# Patient Record
Sex: Female | Born: 1935 | Race: White | Hispanic: No | Smoking: Former smoker
Health system: Southern US, Community
[De-identification: ages and names within clinical notes are randomized; demographics above are authoritative.]

## PROBLEM LIST (undated history)

## (undated) DIAGNOSIS — N209 Urinary calculus, unspecified: Secondary | ICD-10-CM

## (undated) DIAGNOSIS — N189 Chronic kidney disease, unspecified: Secondary | ICD-10-CM

## (undated) DIAGNOSIS — I8390 Asymptomatic varicose veins of unspecified lower extremity: Secondary | ICD-10-CM

## (undated) DIAGNOSIS — Z87442 Personal history of urinary calculi: Secondary | ICD-10-CM

## (undated) DIAGNOSIS — M1990 Unspecified osteoarthritis, unspecified site: Secondary | ICD-10-CM

## (undated) DIAGNOSIS — J189 Pneumonia, unspecified organism: Secondary | ICD-10-CM

## (undated) DIAGNOSIS — H3530 Unspecified macular degeneration: Secondary | ICD-10-CM

## (undated) DIAGNOSIS — Z8719 Personal history of other diseases of the digestive system: Secondary | ICD-10-CM

## (undated) DIAGNOSIS — F419 Anxiety disorder, unspecified: Secondary | ICD-10-CM

## (undated) HISTORY — DX: Asymptomatic varicose veins of unspecified lower extremity: I83.90

## (undated) HISTORY — DX: Unspecified osteoarthritis, unspecified site: M19.90

## (undated) HISTORY — PX: TONSILLECTOMY: SUR1361

## (undated) HISTORY — PX: BUNIONECTOMY: SHX129

## (undated) HISTORY — PX: ORIF WRIST FRACTURE: SHX2133

## (undated) HISTORY — PX: ABDOMINAL HYSTERECTOMY: SHX81

## (undated) HISTORY — PX: JOINT REPLACEMENT: SHX530

---

## 1997-02-11 ENCOUNTER — Ambulatory Visit (HOSPITAL_COMMUNITY): Admission: RE | Admit: 1997-02-11 | Discharge: 1997-02-11 | Payer: Self-pay | Admitting: Obstetrics and Gynecology

## 1998-09-30 ENCOUNTER — Other Ambulatory Visit: Admission: RE | Admit: 1998-09-30 | Discharge: 1998-09-30 | Payer: Self-pay | Admitting: Obstetrics and Gynecology

## 1998-11-26 ENCOUNTER — Ambulatory Visit (HOSPITAL_COMMUNITY): Admission: RE | Admit: 1998-11-26 | Discharge: 1998-11-26 | Payer: Self-pay | Admitting: Obstetrics and Gynecology

## 1998-11-26 ENCOUNTER — Encounter: Payer: Self-pay | Admitting: Obstetrics and Gynecology

## 1999-06-04 ENCOUNTER — Encounter: Payer: Self-pay | Admitting: Internal Medicine

## 1999-06-04 ENCOUNTER — Encounter: Admission: RE | Admit: 1999-06-04 | Discharge: 1999-06-04 | Payer: Self-pay | Admitting: Internal Medicine

## 2000-02-02 ENCOUNTER — Other Ambulatory Visit: Admission: RE | Admit: 2000-02-02 | Discharge: 2000-02-02 | Payer: Self-pay | Admitting: Obstetrics and Gynecology

## 2000-05-09 ENCOUNTER — Ambulatory Visit (HOSPITAL_COMMUNITY): Admission: RE | Admit: 2000-05-09 | Discharge: 2000-05-09 | Payer: Self-pay | Admitting: Orthopedic Surgery

## 2000-05-09 ENCOUNTER — Encounter: Payer: Self-pay | Admitting: Orthopedic Surgery

## 2000-08-17 ENCOUNTER — Ambulatory Visit (HOSPITAL_COMMUNITY): Admission: RE | Admit: 2000-08-17 | Discharge: 2000-08-17 | Payer: Self-pay | Admitting: Gastroenterology

## 2000-09-25 ENCOUNTER — Emergency Department (HOSPITAL_COMMUNITY): Admission: EM | Admit: 2000-09-25 | Discharge: 2000-09-26 | Payer: Self-pay | Admitting: Emergency Medicine

## 2000-09-28 ENCOUNTER — Encounter: Payer: Self-pay | Admitting: Gastroenterology

## 2000-09-28 ENCOUNTER — Encounter: Admission: RE | Admit: 2000-09-28 | Discharge: 2000-09-28 | Payer: Self-pay | Admitting: Gastroenterology

## 2000-10-12 ENCOUNTER — Encounter: Payer: Self-pay | Admitting: Internal Medicine

## 2000-10-12 ENCOUNTER — Encounter: Admission: RE | Admit: 2000-10-12 | Discharge: 2000-10-12 | Payer: Self-pay | Admitting: Internal Medicine

## 2000-10-24 ENCOUNTER — Encounter: Payer: Self-pay | Admitting: Internal Medicine

## 2000-10-24 ENCOUNTER — Encounter: Admission: RE | Admit: 2000-10-24 | Discharge: 2000-10-24 | Payer: Self-pay | Admitting: Internal Medicine

## 2000-11-02 ENCOUNTER — Ambulatory Visit (HOSPITAL_COMMUNITY): Admission: RE | Admit: 2000-11-02 | Discharge: 2000-11-02 | Payer: Self-pay | Admitting: Internal Medicine

## 2000-11-02 ENCOUNTER — Encounter: Payer: Self-pay | Admitting: Internal Medicine

## 2001-01-24 ENCOUNTER — Encounter: Payer: Self-pay | Admitting: Internal Medicine

## 2001-01-24 ENCOUNTER — Encounter: Admission: RE | Admit: 2001-01-24 | Discharge: 2001-01-24 | Payer: Self-pay | Admitting: Internal Medicine

## 2002-04-27 ENCOUNTER — Encounter: Admission: RE | Admit: 2002-04-27 | Discharge: 2002-04-27 | Payer: Self-pay | Admitting: Internal Medicine

## 2002-04-27 ENCOUNTER — Encounter: Payer: Self-pay | Admitting: Internal Medicine

## 2002-05-09 ENCOUNTER — Inpatient Hospital Stay (HOSPITAL_COMMUNITY): Admission: RE | Admit: 2002-05-09 | Discharge: 2002-05-14 | Payer: Self-pay | Admitting: Orthopedic Surgery

## 2002-05-10 ENCOUNTER — Encounter: Payer: Self-pay | Admitting: Orthopedic Surgery

## 2002-05-14 ENCOUNTER — Inpatient Hospital Stay (HOSPITAL_COMMUNITY)
Admission: RE | Admit: 2002-05-14 | Discharge: 2002-05-19 | Payer: Self-pay | Admitting: Physical Medicine & Rehabilitation

## 2002-06-25 ENCOUNTER — Encounter: Admission: RE | Admit: 2002-06-25 | Discharge: 2002-06-25 | Payer: Self-pay | Admitting: Internal Medicine

## 2002-06-25 ENCOUNTER — Encounter: Payer: Self-pay | Admitting: Internal Medicine

## 2002-07-02 ENCOUNTER — Ambulatory Visit (HOSPITAL_BASED_OUTPATIENT_CLINIC_OR_DEPARTMENT_OTHER): Admission: RE | Admit: 2002-07-02 | Discharge: 2002-07-02 | Payer: Self-pay | Admitting: Urology

## 2002-07-05 ENCOUNTER — Ambulatory Visit (HOSPITAL_BASED_OUTPATIENT_CLINIC_OR_DEPARTMENT_OTHER): Admission: RE | Admit: 2002-07-05 | Discharge: 2002-07-05 | Payer: Self-pay | Admitting: Urology

## 2002-07-05 ENCOUNTER — Encounter: Payer: Self-pay | Admitting: Urology

## 2002-07-26 ENCOUNTER — Ambulatory Visit (HOSPITAL_BASED_OUTPATIENT_CLINIC_OR_DEPARTMENT_OTHER): Admission: RE | Admit: 2002-07-26 | Discharge: 2002-07-26 | Payer: Self-pay | Admitting: Urology

## 2002-11-05 ENCOUNTER — Ambulatory Visit (HOSPITAL_COMMUNITY): Admission: RE | Admit: 2002-11-05 | Discharge: 2002-11-05 | Payer: Self-pay | Admitting: Urology

## 2002-11-05 ENCOUNTER — Ambulatory Visit (HOSPITAL_BASED_OUTPATIENT_CLINIC_OR_DEPARTMENT_OTHER): Admission: RE | Admit: 2002-11-05 | Discharge: 2002-11-05 | Payer: Self-pay | Admitting: Urology

## 2003-01-28 ENCOUNTER — Encounter: Admission: RE | Admit: 2003-01-28 | Discharge: 2003-01-28 | Payer: Self-pay | Admitting: Internal Medicine

## 2003-02-20 ENCOUNTER — Inpatient Hospital Stay (HOSPITAL_COMMUNITY): Admission: RE | Admit: 2003-02-20 | Discharge: 2003-02-23 | Payer: Self-pay | Admitting: Orthopedic Surgery

## 2003-02-23 ENCOUNTER — Inpatient Hospital Stay (HOSPITAL_COMMUNITY)
Admission: RE | Admit: 2003-02-23 | Discharge: 2003-03-05 | Payer: Self-pay | Admitting: Physical Medicine & Rehabilitation

## 2003-11-27 ENCOUNTER — Encounter: Admission: RE | Admit: 2003-11-27 | Discharge: 2003-11-27 | Payer: Self-pay | Admitting: Orthopedic Surgery

## 2003-11-29 ENCOUNTER — Ambulatory Visit (HOSPITAL_COMMUNITY): Admission: RE | Admit: 2003-11-29 | Discharge: 2003-11-29 | Payer: Self-pay | Admitting: Orthopedic Surgery

## 2003-11-29 ENCOUNTER — Ambulatory Visit (HOSPITAL_BASED_OUTPATIENT_CLINIC_OR_DEPARTMENT_OTHER): Admission: RE | Admit: 2003-11-29 | Discharge: 2003-11-29 | Payer: Self-pay | Admitting: Orthopedic Surgery

## 2005-06-01 ENCOUNTER — Encounter: Admission: RE | Admit: 2005-06-01 | Discharge: 2005-06-01 | Payer: Self-pay | Admitting: Gastroenterology

## 2005-07-08 ENCOUNTER — Encounter: Admission: RE | Admit: 2005-07-08 | Discharge: 2005-07-08 | Payer: Self-pay | Admitting: Gastroenterology

## 2005-08-11 ENCOUNTER — Encounter: Admission: RE | Admit: 2005-08-11 | Discharge: 2005-08-11 | Payer: Self-pay | Admitting: Gastroenterology

## 2007-07-23 ENCOUNTER — Encounter: Admission: RE | Admit: 2007-07-23 | Discharge: 2007-07-23 | Payer: Self-pay | Admitting: Internal Medicine

## 2009-09-18 ENCOUNTER — Encounter: Admission: RE | Admit: 2009-09-18 | Discharge: 2009-09-18 | Payer: Self-pay | Admitting: Internal Medicine

## 2010-05-29 NOTE — Discharge Summary (Signed)
NAME:  Caroline Griffith, Caroline Griffith                          ACCOUNT NO.:  [id number]   MEDICAL RECORD NO.:  [id number]                   PATIENT TYPE:  IPS   LOCATION:  4151                                 FACILITY:  [facility]   PHYSICIAN:  Ellwood Dense, M.D.                DATE OF BIRTH:  October 26, 1930   DATE OF ADMISSION:  05/14/2002  DATE OF DISCHARGE:  05/19/2002                                 DISCHARGE SUMMARY   DISCHARGE DIAGNOSES:  1. Right total knee replacement.  2. Postoperative hematoma.  3. Urinary tract infection.   HISTORY OF PRESENT ILLNESS:  The patient is a 77 year old female with  history of GI bleed, diverticulosis, right knee pain x years, excessive  medial compartment and end-stage changes.  The patient elected to undergo  right total knee replacement May 09, 2002, by Dr. Madelon Lips.  Postoperatively, she is weightbearing as tolerated and on Coumadin for DVT  prophylaxis.  She did receive two units of packed red blood cells past  surgery secondary to drop in H&H.  PT and OT is ongoing.  The patient is  currently minimal assist for transfers, supervision for ambulating 50 feet  with rolling walker, moderate to total assist for ADLs.   PAST MEDICAL HISTORY:  See discharge diagnoses, plus history of pulmonary  nodules, cystitis secondary to UTI, insomnia, hysterectomy, tonsillectomy,  urgency.   ALLERGIES:  SULFA.   SOCIAL HISTORY:  The patient is married and lives with her husband in a one  level home with four steps at entry.  She was independent prior to  admission.   HABITS:  She quit tobacco 40 years ago.  She has two glasses of wine weekly.   HOSPITAL COURSE:  The patient was admitted to rehab on May 14, 2002, for  inpatient therapies to consist of PT and OT daily.  Past admission, she was  maintained on Coumadin with cross coverage with heparin until  Coumadin was  therapeutic.  She was noted to have diffuse ecchymosis from her right knee,  onto her shin and down to  her ankle.  She has had edema of 1+, right lower  extremity, that is stable.  She does continue with small amount of  serosanguineous drainage from the distal aspect of her incision.  She was  noted to have some erythema distally at lateral aspect and was started on  Cipro for wound prophylaxis as well as gram negative rods in her urine.   Labs done past admission showed hemoglobin of 11.9, hematocrit 35.2, white  count 5.9, ______ 30.  Sodium 138, potassium 3.8, chloride 102, CO2 29, BUN  7, creatinine 0.8, glucose 103, alkaline phosphatase 230.   The patient was maintained on Vioxx during her stay.  This was discontinued  at the time of discharge secondary to right lower extremity edema and as to  whether this could be contributing to continued drainage from her incision.  During her stay in rehab, the patient progressed to being independent for  transfers, modified independent for ambulating 150 feet with a rolling  walker, modified independent for navigating four stairs.  She is modified  independent for ADLs including toileting as well as for advanced ADLs.  The  patient is to continue with home health PT and OT past discharge.  The  patient's range of motion of her knee is currently at 92 active, 98 degrees  assisted active.  The patient is to continue on Coumadin 5 mg a day with the  next pro time to checked on May 21, 2002.   DISPOSITION:  On May 19, 2002, the patient is to be discharged to home.   DISCHARGE MEDICATIONS:  1. Coumadin 5 mg p.o. per day.  2. Robaxin 500 mg p.o. q.i.d.  3. Cipro 250 mg b.i.d.  4. Oxycodone IR 5 or 10 mg q.4-6h. p.r.n. pain.  5. Senokot S two q.h.s.  6. Os-Cal plus D one p.o. t.i.d.  7. For pain regiment, the patient can use oxycodone and/or Tylenol.   ACTIVITY:  The patient is to use her walker.  Keep right lower extremity  elevated and wear support stockings.   DIET:  Regular.   WOUND CARE:  Keep area clean and dry.  Keep dry dressing on  the incision.   DISCHARGE INSTRUCTIONS:  No alcohol, no smoking and no driving.   FOLLOW UP:  The patient is to follow up with Dr. Madelon Lips five to seven days  for staple removal and for wound care.  The patient will follow up with Dr.  Lamar Benes as needed.     Greg Cutter, P.A.                    Ellwood Dense, M.D.    PP/MEDQ  D:  05/18/2002  T:  05/21/2002  Job:  [id number]   cc:   Thora Lance, M.D.  [address] [place]  [place] [address]  Fax: [phone]   Thera Flake., M.D.  [address] [place]  [place] [address]  Fax: [phone]

## 2010-05-29 NOTE — Op Note (Signed)
NAME:  Caroline Griffith, Caroline Griffith                          ACCOUNT NO.:  [id number]   MEDICAL RECORD NO.:  [id number]                   PATIENT TYPE:  AMB   LOCATION:  [facility]                                 FACILITY:  [facility]   PHYSICIAN:  Bertram Millard. Dahlstedt, M.D.          DATE OF BIRTH:  Mar 28, 1930   DATE OF PROCEDURE:  11/05/2002  DATE OF DISCHARGE:                                 OPERATIVE REPORT   PREOPERATIVE DIAGNOSIS:  Incomplete passage of left renal/ureteral calculi,  status post lithotripsy.   POSTOPERATIVE DIAGNOSIS:  Incomplete passage of left renal/ureteral calculi,  status post lithotripsy.   PRINCIPAL PROCEDURE:  Cystoscopy, left retrograde ureteropyelogram, left  ureteroscopy, basket extraction of two left ureteral calculi, double J stent  placement (24 cm x 6 [place]), interpretive fluoroscopy.   SURGEON:  Bertram Millard. Dahlstedt, M.D.   ANESTHESIA:  General with LMA.   COMPLICATIONS:  None.   BRIEF HISTORY:  This nice 77 year old lady is status post two separate  lithotripsies for a large left renal calculus.  She has had incomplete  passage of these stones, followed over a couple of month period.  She is not  having significant pain.  Despite the infrequent episodes of pain, it was  recommended that she undergo left ureteroscopy with extraction of her  stone(s).  She is aware of risks and complications of this procedure and  desires to proceed.   DESCRIPTION OF PROCEDURE:  The patient was administered a general anesthetic  after IV antibiotics were administered in the holding area.  She was placed  in the dorsal lithotomy position.  Genitalia and perineum were prepped and  draped.  A 22 French panendoscope was advanced into the bladder, which was  inspected and found to be normal.  The left ureteral orifice was cannulated  with a 6 French end-hole catheter and a retrograde revealed a filling defect  in the left distal ureter, approximately 5-6 cm up the ureter.  There was  no  hydronephrosis.  No filling defects were seen along the rest of the ureter  or in the pyelocalyceal system on the left.  A ureteroscope was then passed  up and a large stone encountered with what looked like a small stone behind  it.  This was extracted with a nitinol basket.  Another pass of the  ureteroscope allowed [place] to engage the second stone, which was then  extracted.  The scope was then advanced up into the kidney.  No stones were  seen up in the proximal ureter or the pelvis.  The ureteroscope was then  removed.  Using fluoroscopic guidance and a cystoscope, a 24 cm x 6 [place]  double J stent was passed using fluoroscopic guidance.  Good curls were seen  proximally and distally on the stent once the guidewire was removed.  The  bladder was drained and the procedure terminated.  A string was left on the  end of the  stent.   The patient was awakened and taken to the PACU in stable condition.  She was  told to discontinue the stent using the string in approximately two days.   Medications at the time of discharge were her usual home medications plus  Vicodin #15, one p.o. q.4h. p.r.n. pain, and Macrobid one p.o. b.i.d. x6  doses, and Ditropan 5 mg one p.o. q.8h. p.r.n. urinary frequency, #10.  She  will follow up in approximately a week.                                                Bertram Millard. Dahlstedt, M.D.    SMD/MEDQ  D:  11/05/2002  T:  11/05/2002  Job:  [id number]

## 2010-05-29 NOTE — H&P (Signed)
NAME:  Caroline Griffith, Caroline Griffith                    ACCOUNT NO.:  [id number]   MEDICAL RECORD NO.:  [id number]                   PATIENT TYPE:  INP   LOCATION:  NA                                   FACILITY:  [facility]   PHYSICIAN:  Dyke Brackett, M.D.                 DATE OF BIRTH:  03-26-1930   DATE OF ADMISSION:  02/20/2003  DATE OF DISCHARGE:                                HISTORY & PHYSICAL   CHIEF COMPLAINT:  Right hip pain.   HISTORY OF PRESENT ILLNESS:  The patient is a 77 year old white female with  a history of right hip pain, significantly worsened now over the last six  months or so.  Patient had a right total knee arthroplasty performed in  April, 2004.  She was having right hip pains at that time.  She describes it  as pain with weightbearing activities and range of motion.  The hip does  seem to want to give way at times.  She describes the pain as a deep, dull,  achy sensation deep within the groin with occasional sharp shooting pains  around the lateral aspect of the hip.  She denies any mechanical symptoms.  She does have night pain.   X-rays reveal bone-on-bone of the femoral head to the acetabulum.   ALLERGIES:  SULFA.   CURRENT MEDICATIONS:  1. Mobic 15 mg p.o. daily.  2. Pepcid one tablet p.r.n.  3. Calcium 600 plus vitamin D two tablets a day.  4. Glucosamine.  5. Chondroitin.   PRESENT MEDICAL HISTORY:  1. Hiatal hernia.  2. History of multiple recent kidney stones.  3. Some memory loss with total knee arthroplasty anesthesia.   PAST SURGICAL HISTORY:  1. Right total knee arthroplasty in April of 2004.  2. Kidney stones x4 since total knee arthroplasty.  3. Patient states that her only complications with previous surgeries is     significant memory loss status post total knee arthroplasty anesthesia     that has progressively been improving.   SOCIAL HISTORY:  Patient is a 77 year old white female, appears to be  physically fit.  Denies any history of  smoking.  She has not smoked for the  past 40 years.  She has an occasional glass of wine.  She is married and  lives with her husband.   FAMILY PHYSICIAN:  Dr. Roseanne Reno.   FAMILY MEDICAL HISTORY:  Mother is deceased from emphysema, she was a  smoker.  Father is deceased from an MI, he had significant cardiac issues  throughout his life.   REVIEW OF SYSTEMS:  Positive for significant memory loss with total knee  arthroplasty anesthesia.  She has had multiple kidney stones requiring  procedures for removal since the total knee arthroplasty.  Otherwise, all  other review of system categories are negative and noncontributory at this  time.   PHYSICAL EXAM:  VITALS:  Height is 5 feet 6 inches, weight is  165 pounds,  blood pressure is 138/68, pulse is 76 and regular, respirations 12, patient  is afebrile.  GENERAL:  This is a healthy-appearing, frail white female.  She does walk  with a cane in her right hand.  She does walk very slow, deliberate steps.  She is able to get herself on and off the exam table by herself without  assistance.  She does not appear to be in obvious distress.  HEENT:  Head was normocephalic.  Pupils are equal, round and reactive,  accommodating to light.  Extraocular movements intact.  Sclera was not  icteric.  External ears were without deformities.  Gross hearing is intact.  Nasal septum was midline.  Oral buccal mucosa was intact.  The patient was  able to swallow without any difficulty.  NECK:  Supple, no palpable lymphadenopathy.  Thyroid region was nontender.  She was able to touch her chin to the chest, look up towards the ceiling at  about a 45 degree angle and look right-to-left over the shoulders without  any mechanical symptoms or discomfort.  She is nontender to percussion along  the entire spinal column.  CHEST:  Lung sounds were clear and equal bilaterally.  Wheezes, rales,  rhonchi, rubs noted.  HEART:  Regular rate and rhythm, S1 and S2 was  auscultated.  No murmurs,  rubs or gallops noted.  ABDOMEN:  Soft, nontender.  Bowel sounds were normoactive throughout.  No  hepatosplenomegaly.  UPPER EXTREMITIES:  Patient had full range of motion of her shoulders,  elbows and wrists.  Motor strength was 5/5.  Upper extremities were  symmetrically sized and shaped.  LOWER EXTREMITIES:  Right hip had full extension, flexion up to about 95  degrees with about 10 degrees internal rotation and about 5 degrees external  rotation, limited by pain and mechanical symptoms.  The left hip had full  extension, flexion up to 120 degrees with 20 degrees internal, external  rotation without any discomfort.  Right knee had a well-healed midline  surgical incision, no instability.  She had full extension/flexion back to  115 degrees.  Left knee had full extension/flexion to 120 degrees, no  instability, no palpable effusion, no signs of infection.  She was slightly  tender along the entire joint line.  Bilateral calves were nontender.  Ankles were symmetric with good dorsi/plantar flexion.  PERIPHERAL VASCULATURE:  Carotid pulses were 2+, radial pulses 2+, no  carotid bruits.  Dorsalis pedis and posterior tibial pulses were 1+.  She  had lower extremity edema or pigmentation changes.  NEURO:  Patient was conscious, alert and appropriate.  She did appear to  have some slow memory for certain issues but otherwise she appeared to be  fairly appropriate.  She was grossly intact to light touch sensation.  There  were no other gross neurologic defects noted.  BREAST, RECTAL AND GU EXAMS:  Were deferred at this time.   IMPRESSION:  1. End-stage osteoarthritis right hip.  2. History of hiatal hernia with occasional problems with reflux.  3. Memory loss issues possibly related to previous right total knee     arthroplasty and anesthesia.   PLAN:  1. Patient will undergo all routine labs and tests prior to having a right    total hip arthroplasty by Dr.  Madelon Lips at [facility] on February 20, 2003.  2. Patient will undergo all routine labs and tests prior to this procedure.  3. Patient will have a pre-anesthesia consult  to discuss memory loss issues     and the frequency in which she has recently had anesthesia.  The patient     has donated two units of autologous blood for this upcoming procedure.      Jamelle Rushing, Arnetha Courser, M.D.    RWK/MEDQ  D:  02/14/2003  T:  02/14/2003  Job:  [id number]

## 2010-05-29 NOTE — Op Note (Signed)
NAME:  Caroline Griffith, Caroline Griffith                          ACCOUNT NO.:  [id number]   MEDICAL RECORD NO.:  [id number]                   PATIENT TYPE:  AMB   LOCATION:  [facility]                                 FACILITY:  [facility]   PHYSICIAN:  Bertram Millard. Dahlstedt, M.D.          DATE OF BIRTH:  1930-12-05   DATE OF PROCEDURE:  07/02/2002  DATE OF DISCHARGE:                                 OPERATIVE REPORT   PREOPERATIVE DIAGNOSIS:  Left hydronephrosis secondary to large left UPJ  stone.   POSTOPERATIVE DIAGNOSIS:  Left hydronephrosis secondary to large left UPJ  stone.   PRINCIPAL PROCEDURE:  Cystoscopy, left retrograde ureteral pyelogram, left  double J stent placement, interpretive fluoroscopy.   ANESTHESIA:  General.   COMPLICATIONS:  None.   BRIEF HISTORY:  This 77 year old female presented recently to my office with  a large left UPJ stone and hydronephrosis. She has been minimally  symptomatic for some time. There was some evidence of left renal atrophy.  The preferred method in my view is stent placement to be followed by  lithotripsy. The patient is aware of this procedure, risks and  complications. She is also aware of other alternatives including in situ  lithotripsy, ureteroscopy and percutaneous nephrolithotomy. She presents at  this time for stent placement, to be followed in three days by lithotripsy.   DESCRIPTION OF PROCEDURE:  The patient was administrated preoperative IV  antibiotics. She was placed in the dorsal lithotomy position and general  anesthetic was then induced (the patient has a right total knee  arthroplasty). Genitalia and perineum were prepped and draped. A 22 French  panendoscope was placed in her bladder which was inspected and found to be  normal. The left ureter was cannulated with an end hole catheter and  retrograde was performed showing a filling defect of the left UPJ with  significant blunting of the calices.  I was eventually able to guide a  Glidewire by the stone using the assistance of an end hole catheter. Once  this guidewire was in place in the upper pole calices, a 6 French x 24 cm  double J stent was placed using fluoroscopic and cystoscopic guidance. Good  curls were seen distally and proximally although the proximal curl was just  beyond the stone at the UPJ. This was felt to be adequately positioned,  however. At this point, the bladder was drained and the scope removed. The  procedure was then terminated.   The patient was awakened and taken to the PACU in stable condition. She will  followup in three days at the [facility] for lithotripsy.                                               Bertram Millard. Retta Diones, M.D.    SMD/MEDQ  D:  07/02/2002  T:  07/02/2002  Job:  [id number]   cc:   Thora Lance, M.D.  [address]  [place]  [place] [address]  Fax: [phone]

## 2010-05-29 NOTE — Discharge Summary (Signed)
NAME:  Caroline Griffith, Caroline Griffith                          ACCOUNT NO.:  [id number]   MEDICAL RECORD NO.:  [id number]                   PATIENT TYPE:  IPS   LOCATION:  4145                                 FACILITY:  [facility]   PHYSICIAN:  Ranelle Oyster, M.D.             DATE OF BIRTH:  May 24, 1930   DATE OF ADMISSION:  02/23/2003  DATE OF DISCHARGE:  03/05/2003                                 DISCHARGE SUMMARY   DISCHARGE DIAGNOSES:  1. Right total hip arthroplasty secondary to osteoarthritis February 20, 2003.  2. Anemia.  3. Pain management.  4. Arixtra for deep venous thrombosis prophylaxis.  5. Hypertension.  6. Gastroesophageal reflux disease.  7. Kidney stones.  8. Right knee replacement May 2004.  9. Left knee osteoarthritis.   HISTORY OF PRESENT ILLNESS:  A 77 year old white female history of right  total knee replacement May 2004 admitted with end-stage osteoarthritis of  the right hip and no change with conservative care.  Underwent a right total  hip arthroplasty February 9 by Dr. Madelon Lips, placed on Arixtra for deep  venous thrombosis prophylaxis and partial weightbearing, postoperative  anemia 7.6 and transfused on February 21, 2003.  Postoperative pain  management, placed on OxyContin sustained release with good results.  She  was total assist bed mobility, admitted for comprehensive rehab program.   PAST MEDICAL HISTORY:  See discharge diagnoses.   PAST SURGICAL HISTORY:  Right total knee replacement May 2004.  Tonsillectomy, hysterectomy.   ALLERGIES:  SULFA.   SOCIAL HISTORY:  Occasional alcohol, remote smoker.  Lives with husband in  [place].  Independent prior to admission, one level home, 3 steps to  entry, husband minimal assistance, no local family.   MEDICATIONS PRIOR TO ADMISSION:  1. Mobic 15 mg daily.  2. Os-Cal.   PRIMARY CARE PHYSICIAN:  Dr. Kirby Funk.   HOSPITAL COURSE:  Patient with progressive gains while on rehab services  with therapies  initiated on a b.i.d. basis. The following issues were  followed during patient's rehab course:  Pertaining to Caroline Griffith's right  total hip arthroplasty, surgical site healing nicely, staples have been  removed. Total hip precautions were advised.  She was partial weightbearing  and would follow up with Dr. Madelon Lips.  She remained on Arixtra for deep  venous thrombosis prophylaxis, completing protocol.  Her calves remained  cool without swelling, erythema, nontender.  Postoperative anemia,  transfused February 10/2003.  Latest hemoglobin 10.3, hematocrit 30.  Pain  management with use of OxyContin continued release and this was tapered at  time of discharge.  Her blood pressures were monitored without the use of  antihypertensive medications.  She had no bowel or bladder disturbances  throughout her rehab course.  She did have some extended left knee pain  secondary to osteoarthritis.  Her knee was injected per orthopedic services  on February 21, 2003 with good results.   Overall,  for her functional mobility, she was minimal assist for transfers,  contact guard assist for her ambulation, home health therapies have been  arranged, endurance and strength have greatly improved. Genevieve Norlander would  provide the home health services.   Latest labs showed sodium 138, potassium 4.4, BUN 9, creatinine 0.9,  hemoglobin 10.3, hematocrit 30.   DISCHARGE MEDICATIONS:  1. OxyContin continued release 10 mg every 12 hours x1 week and discontinue.  2. Os-Cal 1 tablet twice daily.  3. Protonix 40 mg daily.  4. Trinsicon 1 capsule twice daily.  5. Oxycodone immediate release as needed for breakthrough pain.   ACTIVITY:  Partial weightbearing 50% with hip precautions.   DIET:  Regular.   WOUND CARE:  Cleanse incision daily, warm soap and water.   SPECIAL INSTRUCTIONS:  Home health, physical and occupational therapy.   FOLLOW UP:  With Dr. Madelon Lips, call for appointment.  Dr. Kirby Funk,  medical  management.      Mariam Dollar, P.A.                     Ranelle Oyster, M.D.    DA/MEDQ  D:  03/04/2003  T:  03/04/2003  Job:  [id number]   cc:   Thera Flake., M.D.  [address] [place]  [place] [address]  Fax: [phone]   Thora Lance, M.D.  [address]  [place]  [place] [address]  Fax: [phone]

## 2010-05-29 NOTE — H&P (Signed)
NAME:  Caroline Griffith, Caroline Griffith NO.:  [id number]   MEDICAL RECORD NO.:  [id number]                   PATIENT TYPE:  INP   LOCATION:                                       FACILITY:  [facility]   PHYSICIAN:  Danielle L. Mahaffey, M.D.          DATE OF BIRTH:  10-31-1930   DATE OF ADMISSION:  05/09/2002  DATE OF DISCHARGE:                                HISTORY & PHYSICAL   CHIEF COMPLAINT:  End-stage osteoarthritis, right knee.   HISTORY OF PRESENT ILLNESS:  Right knee pain for five years.  Patient is  intermittent.  The pain is dull to sharp in nature.  Most of her pain is  located in the medial compartment.  Patient also has back pain, right hip  pain, and knee pain, which makes it difficult for her to walk.  The pain  does awaken her at night.  Her pain is made worse by standing.  Pain is  better with rest.  She is currently on Vioxx, which helps to alleviate her  pain.  She has tried cortisone injections in the past ________ up to six  months.  She has also tried Synvisc injections, which did not give her any  relief.  MRI dated 05/09/2000 showed advanced degenerative changes in the  medial compartment with chondromalacia and bony changes but no meniscal  tear.   ALLERGIES:  No know drug allergies.   CURRENT MEDICATIONS:  1. Vioxx 25 mg one p.o. daily.  2. Calcium 1500 mg daily.  3. Glucosamine chondroitin.  4. Protonix 40 mg one p.r.n.  5. Over-the-counter Pepcid one p.r.n.   PAST MEDICAL HISTORY:  1. Positive for diverticulosis.  2. GERD.  3. Frequent UTIs.  4. History of viral pneumoniae.  5. End-stage osteoarthritis of the right knee.  6. Moderately severe osteoarthritis of the right hip.  7. Right first toe bunion.  8. Crawl toe, right second toe.   PAST SURGICAL HISTORY:  1. Patient has had a hysterectomy.  2. Tonsillectomy.  3. Tubal ligation.  Patient does state that while being put under with     anesthesia during the tubal ligation, she  experienced an electrical     shock feeling.  Otherwise, patient has had no problems with anesthesia.     No blood has been transfused for any of the above procedures.   SOCIAL HISTORY:  Patient denies tobacco use, has an occasional glass of  wine.  Patient is married, has two children.  Family physician is Kirby Funk, M.D.  Patient lives in a [place] home with four steps to the  usual entrance.  Work history, patient is retired.   FAMILY HISTORY:  Mother deceased from heart disease.  Father deceased of  heart disease.  Patient has two brothers who are both deceased.   REVIEW OF SYSTEMS:  Positive for glasses.  Patient denies any angina, PND,  orthopnea.  Patient does  experience some exertion with climbing stairs; she  has discussed this with her primary care physician, who says felt to be due  to deconditioning.  Patient has reflux, occasional diarrhea.  She also  reports some nausea and vomiting and relates this to her GERD.  Patient  denies any dysuria, any change in urgency or frequency currently; however,  she does state that she gets UTIs approximately twice per year.  Nocturia  times one.  Patient has right hip and knee pain, as described above in the  HPI.  Otherwise, review of systems is negative or noncontributory.   PHYSICAL EXAMINATION:  GENERAL:  Patient is a well-developed, well-nourished  female who walks with a limp and antalgic gait.  Patient's mood and affect  are appropriate.  Patient talks easily with the examiner.  Patient's  approximate height is 5 feet 6 inches, weight is 168.  VITAL SIGNS:  Temp 96.0 degrees Fahrenheit, pulse 75, respiratory rate 16,  blood pressure 115/75.  CARDIAC:  Regular rate and rhythm.  No murmurs, rubs, or gallops noted.  RESPIRATORY:  Lungs are clear to auscultation bilaterally.  No wheezing, no  rhonchi noted.  ABDOMEN:  Abdomen is nontender.  Positive bowel sounds, four quadrants.  HEENT:  Normocephalic and atraumatic without  frontal or maxillary sinus  tenderness to palpation.  Conjunctivae are pink bilaterally.  Sclerae are  nonicteric.  PERRLA.  EOMs intact.  No visible external deformities are  noted.  TMs are pearly and gray bilaterally.  Nose, nasal septum midline.  Nasal mucosa is pink and moist and without polyps.  Buccal mucosa is pink  and moist with good dentition.  Pharynx without erythema or exudate.  Tongue  and uvula are midline.  NECK:  Trachea is midline.  No lymphadenopathy.  Carotid pulses are 2+  bilaterally without bruits.  No cervical spine tenderness is noted.  Full  range of motion of cervical spine.  BREASTS, GENITOURINARY, RECTAL EXAMS:  All deferred at this time.  NEUROLOGIC:  Alert and oriented times three.  Cranial nerves II-XII grossly  intact.  Strength in the upper and lower extremities bilaterally is 5/5.  Deep tendon reflexes are 2+ bilaterally in the upper and lower extremities.  MUSCULOSKELETAL:  Upper extremities, full range of motion, 2+ radial pulses  that are symmetric.  Lower extremities, internal/external rotation of the  left hip, full range of motion.  Right hip, external rotation is limited due  to pain; patient able to internally rotate hip without pain.  Straight leg  raise is negative.  Legs bilaterally extend to 0 degrees and flex to around  125 to 130 degrees.  Right knee is tender over the medial compartment.  McMurray's negative bilaterally.  Valgus/varus stressing bilaterally  negative, per laxity.  No catching or clicking is noted in either knee on  passive range of motion.  Lower extremities are nonedematous, 2+ pedal  pulses bilaterally.  EHL and FHL are intact bilaterally.     Richardean Canal, P.A.                       Danielle L. Mahaffey, M.D.    GC/MEDQ  D:  05/01/2002  T:  05/01/2002  Job:  [id number]

## 2010-05-29 NOTE — Discharge Summary (Signed)
NAME:  Caroline Griffith, Caroline Griffith                          ACCOUNT NO.:  [id number]   MEDICAL RECORD NO.:  [id number]                   PATIENT TYPE:  INP   LOCATION:  5036                                 FACILITY:  [facility]   PHYSICIAN:  Dyke Brackett, M.D.                 DATE OF BIRTH:  May 17, 1930   DATE OF ADMISSION:  02/20/2003  DATE OF DISCHARGE:  02/23/2003                                 DISCHARGE SUMMARY   ADMITTING DIAGNOSES:  1. End-stage osteoarthritis of right hip.  2. History of hiatal hernia with occasional problems with reflux.  3. Memory loss issues possibly related to previous right total knee     arthroplasty and anesthesia.   DISCHARGE DIAGNOSES:  1. Status post right total hip arthroplasty.  2. Acute blood loss anemia secondary to surgery, hemoglobin and hematocrit     stable at discharge.  3. Hiatal hernia.  4. Memory loss felt to possibly be related to previous right total knee     arthroplasty and anesthesia.   HISTORY OF PRESENT ILLNESS:  Caroline Griffith is a 77 year old white female with  a history of right hip pain and it has been significantly worse over the  past 6 months.  The patient underwent a right total knee arthroplasty in  April of 2004 and at that time, was having some right hip pain.  Most of her  hip pain has occurred whenever she is involved in weightbearing activities  and range of motion.  Mechanical symptoms include giving-way sensation.  Pain is described as a deep, dull achy sensation deep within the groin with  occasional sharp shooting pain around the lateral aspect of the hip.  She  does have night pain.  X-rays of the right hip reveal bone-on-bone of the  femoral head into the acetabulum.   ALLERGIES:  SULFA DRUGS.   CURRENT MEDICATIONS:  1. Mobic 15 mg p.o. daily.  2. Pepcid 1 tablet p.r.n.  3. Calcium 600 mg as well as vitamin D 2 tabs daily.  4. Glucosamine.  5. Chondroitin.   SURGICAL PROCEDURE:  The patient was taken to the operating  room on February 20, 2003 by Dr. Dyke Brackett, assisted by Dr. Jerolyn Shin. [name] and Legrand Pitts.  Duffy, P.A.-C.  The patient was placed under general anesthesia and a right  total knee replacement was performed.  The following components were used:  Depuy Prodigy porous-coated 12.5-mm small stem with a +5-mm neck length, 28-  mm hip ball and a 50-mm acetabular liner series.  The patient tolerated the  procedure well and returned to recovery in good and stable condition.   CONSULTS:  The following consults were obtained while patient was  hospitalized:  1. PT.  2. OT.  3. Case Management.  4. Rehab.   HOSPITAL COURSE:  The patient developed acute blood loss anemia secondary to  surgery and required 2 units of  autologous blood.  Hemoglobin and hematocrit  on the day of discharge to rehab were 9.4 and 27.7.  Otherwise, the patient  remained afebrile throughout the hospital course and vital signs were  stable.  The patient did progress slowly with physical therapy, therefore  was transferred to rehab on postop day 3.   LABORATORIES:  CBC on admission:  White blood count of 9.2, hemoglobin of  10.8, hematocrit of 31.8 and platelets of 397,000.  Coagulations:  PT 12.7,  INR of 0.9, PTT 30.  Routine chemistries:  Sodium 133, potassium 4.2,  chloride 101, bicarb 24, glucose 88, BUN 15, creatinine 0.9.  Hepatic  enzymes:  AST 18, ALT 13 and ALP 103, total bilirubin 0.4.  Urinalysis on  admission negative.   Right hip x-rays performed postoperatively showed the hip prosthesis to be  in satisfactory position.  No fracture or dislocation was noted.   DISCHARGE INSTRUCTIONS:   MEDICATIONS:  1. Percocet 5 mg 1 to 2 tablets p.o. q.4-6 h. p.r.n. pain.  2. Tylenol 325 mg to 650 mg p.o. q.4 h. p.r.n. temperature greater than     101.5.  3. Robaxin 500 mg to 1000 mg p.o. q.6 h. p.r.n. for muscle spasm.  4. Restoril 15 mg p.o. nightly p.r.n. insomnia.  5. Reglan 10 mg p.o. q.8 h. p.r.n. nausea.   6. Protonix 40 mg p.o. daily.  7. Arixtra 2.5 mg subcu daily for a total of 7 days.  8. Calcium carbonate 2 tablets p.o. daily.  9. OxyContin 10 mg SR 1 p.o. q.12 h.  10.      Sodium chloride nasal spray -- 1 spray p.r.n.   ACTIVITY:  The patient is partial weightbearing 50% body weight on right hip  with walker.   DIET:  No restrictions, advance as tolerated.   CONDITION ON DISCHARGE:  The patient was discharged to rehab in good and  stable condition.   FOLLOWUP:  The patient needs followup with Dr. Madelon Lips in the office  approximately 14 days from surgery.  At that time, staples will be removed  and x-rays will be performed.   WOUND CARE:  The patient is to keep the wound clean and dry.  Daily dressing  changes.  May shower after 2 days of no drainage.  The patient is to call  our office at [phone] if she develops any kind of signs of infection in the  hip.      Richardean Canal, Arnetha Courser, M.D.    GC/MEDQ  D:  03/21/2003  T:  03/23/2003  Job:  [id number]

## 2010-05-29 NOTE — Discharge Summary (Signed)
NAME:  Caroline Griffith, Caroline Griffith                          ACCOUNT NO.:  [id number]   MEDICAL RECORD NO.:  [id number]                   PATIENT TYPE:  INP   LOCATION:  5018                                 FACILITY:  [facility]   PHYSICIAN:  Dyke Brackett, M.D.                 DATE OF BIRTH:  Jun 01, 1930   DATE OF ADMISSION:  05/09/2002  DATE OF DISCHARGE:  05/14/2002                                 DISCHARGE SUMMARY   ADMISSION DIAGNOSES:  1. End-stage osteoarthritis right knee.  2. Diverticulosis.  3. Gastroesophageal reflux disease.  4. Frequent urinary tract infections.  5. History of viral pneumonia.  6. Moderately severe osteoarthritis right hip.  7. Right greater toe bunion.  8. __________  right second toe.   DISCHARGE DIAGNOSES:  1. Status post right total knee arthroscopy.  2. Diverticulosis.  3. Gastroesophageal reflux disease.  4. Frequent urinary tract infections.  5. History of viral pneumonia.  6. Moderately severe osteoarthritis of the right hip.  7. Right first toe bunion.  8. __________  right second toe.   HISTORY OF PRESENT ILLNESS:  The patient is a 77 year old female with right  knee pain for the past five years. She states that the pain is intermittent  and describes it as dull to sharp in nature. Most of her pain is located in  her right knee in the medial compartment. The patient has back pain and  known right hip osteoarthritis, which when combined with her right knee pain  makes it difficult for her to walk. The pain does awaken her at night. The  pain is made worse by standing and is better with rest. Currently she is  taking Vioxx, which helps to alleviate her pain. In the past she has tried  cortisone injections that last approximately six months. Synvisc injections  have also been given, however, this did not provide her any relief. An MRI  dated May 10, 2002 showed advanced degenerative changes in the medial  compartment with chondromalacia and soft bony  changes, but no meniscal tear.  The patient is hospitalized on May 09, 2002 to undergo right knee TKA.   ALLERGIES:  No known drug allergies.   CURRENT MEDICATIONS:  1. Vioxx 25 mg 1 p.o. daily.  2. Calcium 1500 mg daily.  3. Glucosamine chondroitin daily.  4. Protonix 40 mg one tablet p.r.n.  5. Over-the-counter Pepcid 1 p.r.n.   SURGICAL PROCEDURE:  The patient was taken to the OR on April 28 by Dr. Dyke Brackett, assisted by Dr. Jerolyn Shin. [name], and Rexene Edison P.A.-C. The  patient underwent general anesthesia and a right total knee replacement and  lateral release were performed. The patient tolerated the procedure well and  received a femoral nerve block at the end of the case. The patient was  received in the recovery in good stable condition.   CONSULTATIONS:  The following consults  were obtained while the patient was  hospitalized:  PT/OT, rehab, and case management.   HOSPITAL COURSE:  The patient developed postoperative anemia secondary to  surgery. She was transfused with 2 units of packed red blood cells and her  hemoglobin upon discharge was 11.1. The patient also developed some  dysphagia during her hospital stay. Her primary care physician, Dr. Denton Lank  was called. He recommended giving 0.4 mg nitroglycerin under the tongue and  this could be repeated once, if no better we were to call [facility]. The  patient received nitroglycerin and her dysphagia resolved. The patient also  developed some hypokalemia. This was treated and her potassium was 3.4 on  discharge. On postoperative day five the patient still had decreased  mobility, reported slipping in the bathroom and almost falling. 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 Therefore, she was not  comfortable in going home at this time and was discharged to inpatient rehab  in good stable condition.   LABORATORY DATA:  Labs on admission:  CBC white blood count 4.9, hemoglobin  11.6, hematocrit  35.2, platelets 362.   Coags:  PT on admission was 12.6, INR 0.9, PTT 32.   Routine chemistries:  Sodium 142, potassium 4.2, chloride 108, bicarb. 26,  glucose 94, BUN 14, creatinine 1.2.   Liver enzymes on admission:  AST 24, ALP 16, ALT 87, total bilirubin 0.7.   UA on admission:  Leukocyte esterase trace, epithelial cells few.   EKG on admission:  Sinus bradycardia with a heart rate of 58 beats per  minute, PR interval 152 milliseconds, PRT axis 51/1/36.   X-rays:  Right knee status post TKA showed good alignment and position of  the knee replacement.   DISCHARGE INSTRUCTIONS:   DISCHARGE MEDICATIONS:  The patient was to resume home medications and add  the following:  1. K-Dur 20 mEq 1 p.o. daily.  2. OxyContin 10 mg SR p.o. q.12h. p.r.n. pain.  3. Coumadin 7.5 mg with continued daily INR and tested by pharmacy.  4. Iron 325 mg 1 p.o. daily.   ACTIVITY:  As tolerated with walker. CPM 0-90 degrees 6-8 hours a day.  Increase by 10 degrees daily.   DIET:  As tolerated.   CONDITION ON DISCHARGE:  The patient was discharged to inpatient rehab in  good stable condition.   FOLLOW UP:  The patient is to follow up with Dr. Madelon Lips in 14 days  postoperative for reevaluation and staple removal. The patient is to call  Dr. Candise Bowens office at [phone].   WOUND CARE:  The wound is to be kept clean and dry. The patient may shower  after two days if no drainage from the wound. The patient is to call Dr.  Candise Bowens office if any of the following develop:  1. Temperature greater than 101.5.  2. Chills.  3. Swelling.  4. Pain not controlled with pain medication.  5. Foul smelling drainage from the wound.     Richardean Canal, Arnetha Courser, M.D.    GC/MEDQ  D:  06/07/2002  T:  06/07/2002  Job:  [id number]   cc:   Thora Lance, M.D.  [address]  [place]  [place] [address]  Fax: [phone]

## 2010-05-29 NOTE — Op Note (Signed)
NAME:  SHAKALA, MARLATT                          ACCOUNT NO.:  [id number]   MEDICAL RECORD NO.:  [id number]                   PATIENT TYPE:  INP   LOCATION:  2899                                 FACILITY:  [facility]   PHYSICIAN:  Thera Flake., M.D.             DATE OF BIRTH:  [birth year]   DATE OF PROCEDURE:  02/20/2003  DATE OF DISCHARGE:                                 OPERATIVE REPORT   PREOPERATIVE DIAGNOSIS:  Osteoarthritis of right hip.   POSTOPERATIVE DIAGNOSIS:  Osteoarthritis of right hip.   OPERATION PERFORMED:  Right total knee replacement (DePuy Prodigy porous  coated 12.5 mm small stem with +5 mm neck length, 28 mm hip ball, 50 mm  acetabulum 100 series).   SURGEON:  Dyke Brackett, M.D.   ASSISTANT:  1. Jerolyn Shin. Tresa Res, M.D.  2. Legrand Pitts. Duffy, P.A.   ESTIMATED BLOOD LOSS:  Approximately 150 mL.   ANESTHESIA:  General.   DESCRIPTION OF PROCEDURE:  Sterile prep and drape in lateral position with  posterior approach to hip made.  Splitting of the short external rotators,  careful protection of sciatic nerve.  T capsulotomy made in the hip.  The  joint showed moderately severe wear.  Neck cut was made one fingerbreadth  above the lesser trochanter followed by progressive reaming to 12 mm to  accept a 12 mm stem.  Then the rasper used as well.  The calcar planer was  used after the first rasp to even the neck cut.  The neck cut certainly  seemed appropriate.  The acetabulum was next exposed with the blunt  retractors placed anteriorly, posteriorly and superiorly.  The acetabulum  was progressively reamed up to a 49 mm diameter to accept a 50 mm cup  throughout 30 to 40 degrees of abduction and 15 to 20 degrees of  anteversion. Good bleeding bone was obtained.  Final cup was inserted with  good press-fit 100 series followed by insertion of the trial liner and a  trial off the femur with the rasp in the above mentioned sizes were deemed  to be appropriate length with  +5 Prodigy provided slightly more stability  than an AML.  Final components were next inserted.  Prior to this,  everything was copiously.  The capsulotomy was closed followed by fascia  lata, subcutaneous tissues and skin with stapling device.                                               Thera Flake., M.D.    WDC/MEDQ  D:  02/20/2003  T:  02/20/2003  Job:  [id number]

## 2010-05-29 NOTE — Op Note (Signed)
NAME:  Caroline Griffith, Caroline Griffith                ACCOUNT NO.:  [id number]   MEDICAL RECORD NO.:  [id number]          PATIENT TYPE:  AMB   LOCATION:  DSC                          FACILITY:  [facility]   PHYSICIAN:  Thera Flake., M.D.DATE OF BIRTH:  [birth year]   DATE OF PROCEDURE:  11/29/2003  DATE OF DISCHARGE:                                 OPERATIVE REPORT   PREOPERATIVE DIAGNOSES:  1.  Painful bunion with metatarsus primus varus.  2.  Claw toe.   POSTOPERATIVE DIAGNOSES:  1.  Painful bunion with metatarsus primus varus.  2.  Claw toe.   OPERATIONS FOR THE BUNION:  1.  Bunionectomy.  2.  Soft tissue plication.  3.  Adductor release.  4.  Proximal crescentic metatarsal osteotomy.   OPERATIONS FOR THE CLAW TOE:  1.  Proximal interphalangeal joint fusion.  2.  Metatarsophalangeal joint release.   INDICATIONS:  Severe painful bunion and claw toe of second toe of right foot  felt to be amenable to overnight hospitalization.   DESCRIPTION OF PROCEDURE:  Sterile prep and drape.  Exsanguination of the  foot.  Placement of an ankle calf tourniquet to 300.  The claw toe was  corrected through a longitudinal incision of the MTP with release of a mild  contracture of the MTP joint.  It was not thought necessary to lengthen the  extensor tendon.  Deformity of the fixed PIP joint was explored through a  transverse incision.  Complete release of the collateral ligaments on both  joints was carried out followed by denuding of the articular surfaces of the  joint on the toe to create a PIP fusion which eventually was fixed with an  0.5 K-wire.  Adductor release was accomplished through an incision just  lateral to the proximal phalanx of the great toe with complete release of  tendon and a partial release of the lateral capsule of the big toe MTP  joint.  Bone was approached through a lateral medial incision with removal  of the eminence with creation of a distally based flap of capsule.  The  metatarsal osteotomy was accomplished at the junction of the diaphysis  metaphysis proximally with crescentic osteotomy distally.  Pressure of the  proximal piece medially with pressure on the distal fragment laterally to  correct the metatarsus primus varus, fixed with 0.62 K-wires confirmed to be  in good position with x-ray.  The x-ray confirmed that there was reasonable  correction of the metatarsus primus varus.  The bunion was corrected further  with soft tissue plication.  Again x-ray confirmed this.  Calluses were  trimmed about the areas of the second great toe where there was a kissing  lesion of skin.  The soft tissue plication  had been fixed on the medial side with #2 fiber wire followed by running  nylon.  Could not use Marcaine secondary to a previous block.  A  decompressive dressing was applied with a volar plaster splint with a toe  box.  She was taken to the recovery room in stable condition.      Margarito Liner  WDC/MEDQ  D:  11/29/2003  T:  11/30/2003  Job:  [id number]

## 2010-05-29 NOTE — Op Note (Signed)
NAME:  Caroline Griffith, Caroline Griffith                          ACCOUNT NO.:  [id number]   MEDICAL RECORD NO.:  [id number]                   PATIENT TYPE:  INP   LOCATION:  2550                                 FACILITY:  [facility]   PHYSICIAN:  Thera Flake., M.D.             DATE OF BIRTH:  01-08-1931   DATE OF PROCEDURE:  05/09/2002  DATE OF DISCHARGE:                                 OPERATIVE REPORT   PREOPERATIVE DIAGNOSIS:  Osteoarthritis of right knee.   POSTOPERATIVE DIAGNOSIS:  Osteoarthritis of right knee.   OPERATION PERFORMED:  Right total knee replacement (cemented LCS size 3  tibia, standard femur, standard patella, 17.5 mm rotating platform).  Lateral release.   SURGEON:  Dyke Brackett, M.D.   ASSISTANT:  1. Jerolyn Shin. Tresa Res, M.D.  2. Madilyn Fireman, P.A.-C.   TOURNIQUET TIME:  Approximately one hour, 40 minutes.   ANESTHESIA:  General.   DESCRIPTION OF PROCEDURE:  Sterile prep and drape.  Longitudinal incision of  the knee with medial parapatellar approach.  The most diseased compartment  was the medial.  Resection of the tibia was made, 7 degree posterior slope,  about 2 mm below the most diseased compartment, followed by sizing the  fibula to be standard, cutting the anterior posterior femoral cuts, the 4  degree valgus cut and the finishing cuts, appropriate ligamentous balancing  with the flexion gap equaling the extension gap at 17.5 mm.  Mild  insufficiency was noted of the collateral ligament on the medial side.  This  was carefully protected throughout the procedure.  Patella cut was made  after a keel cut made to the tibia leaving about 14 mm native patella.  Lateral release was required.  Lateral release corrected the patellar tilt  nicely and final components were placed with a trial polyethylene.  Cement  was allowed to harden.  Excess cement was removed.  The knee fully extended  to probably -5 degrees.  Good stability in extension.  There was a 0.5 cm  opening  with the knee flexed 30 degrees.  This was deemed to be acceptable.  There was a drawer of about 1 to 2+ with no problems with bearing  subluxation with the patellar tendon reapproximated with towel clips.  Bearing tracked normally.  Final polyethylene was inserted followed by  closure of the patellar tendon with #2 FiberWire and #2 Ethibond, 0 and 2-0  Vicryl.  Skin clips on the skin.  Femoral nerve block could be inserted  potentially after the case.  Lightly compressive sterile dressing and knee  immobilizer applied.                                                Thera Flake., M.D.    WDC/MEDQ  D:  05/09/2002  T:  05/09/2002  Job:  [id number]

## 2011-02-03 ENCOUNTER — Other Ambulatory Visit: Payer: Self-pay | Admitting: Internal Medicine

## 2011-02-08 ENCOUNTER — Ambulatory Visit
Admission: RE | Admit: 2011-02-08 | Discharge: 2011-02-08 | Disposition: A | Discharge status: Home / Self Care | Payer: Medicare Other | Source: Ambulatory Visit | Attending: Internal Medicine | Admitting: Internal Medicine

## 2011-02-08 MED ORDER — IOHEXOL 300 MG/ML  SOLN
75.0000 mL | Freq: Once | INTRAMUSCULAR | Status: AC | PRN
  Administered 2011-02-08: 75 mL via INTRAVENOUS

## 2012-11-16 ENCOUNTER — Ambulatory Visit: Payer: Self-pay | Admitting: Podiatry

## 2012-11-16 ENCOUNTER — Ambulatory Visit (INDEPENDENT_AMBULATORY_CARE_PROVIDER_SITE_OTHER): Payer: Medicare Other | Admitting: Podiatry

## 2012-11-16 DIAGNOSIS — L84 Corns and callosities: Secondary | ICD-10-CM

## 2012-11-16 DIAGNOSIS — M216X9 Other acquired deformities of unspecified foot: Secondary | ICD-10-CM

## 2012-11-20 NOTE — Progress Notes (Signed)
Subjective:     Patient ID: Caroline Griffith, female   DOB: Nov 01, 1930, 79 y.o.   MRN: [id number]  HPI patient complains of painful nailbeds that are thick and impossible to cut 1-5 both feet   Review of Systems     Objective:   Physical Exam  Nursing note and vitals reviewed. Constitutional: She is oriented to person, place, and time.  Musculoskeletal: Normal range of motion.  Neurological: She is oriented to person, place, and time.  Skin: Skin is warm.   neurovascular status intact. Nail disease with discomfort 1-5 both feet     Assessment:     Mycotic nail infection with pain 1-5 both feet    Plan:     Debridement of nailbeds with no iatrogenic bleeding 1-5 both feet

## 2013-01-18 ENCOUNTER — Encounter: Payer: Self-pay | Admitting: Podiatry

## 2013-01-18 ENCOUNTER — Ambulatory Visit (INDEPENDENT_AMBULATORY_CARE_PROVIDER_SITE_OTHER): Payer: Medicare Other | Admitting: Podiatry

## 2013-01-18 VITALS — BP 149/86 | HR 80 | Resp 16

## 2013-01-18 DIAGNOSIS — L84 Corns and callosities: Secondary | ICD-10-CM

## 2013-01-18 DIAGNOSIS — M216X9 Other acquired deformities of unspecified foot: Secondary | ICD-10-CM

## 2013-01-18 NOTE — Progress Notes (Signed)
Subjective:     Patient ID: Caroline Griffith, female   DOB: 13-Nov-1930, 80 y.o.   MRN: [id number]  HPI patient presents with pain underneath the first metatarsal both feet stating she is ready for her orthotics because of pain   Review of Systems     Objective:   Physical Exam Neurovascular status intact with no health history changes noted and pain with keratotic tissue formation suffers metatarsal both feet    Assessment:     Plantar flexed metatarsals first both feet with pain    Plan:     Orthotics dispensed with instructions and I discussed with her the cause of her condition treatment options and shoe modifications

## 2013-01-18 NOTE — Patient Instructions (Signed)

## 2013-01-29 ENCOUNTER — Encounter (HOSPITAL_BASED_OUTPATIENT_CLINIC_OR_DEPARTMENT_OTHER): Payer: Medicare Other | Attending: General Surgery

## 2013-01-29 DIAGNOSIS — L97909 Non-pressure chronic ulcer of unspecified part of unspecified lower leg with unspecified severity: Principal | ICD-10-CM

## 2013-01-29 DIAGNOSIS — I87339 Chronic venous hypertension (idiopathic) with ulcer and inflammation of unspecified lower extremity: Secondary | ICD-10-CM | POA: Insufficient documentation

## 2013-01-30 NOTE — Progress Notes (Signed)
[facility]  NAME:  Caroline Griffith, Caroline Griffith NO.:  MEDICAL RECORD NO.:  [id number]      DATE OF BIRTH:  10-18-1930  PHYSICIAN:  Judene Companion, M.D.           VISIT DATE:                                  OFFICE VISIT   This is the first visit for this 80 year old lady who apparently a month ago, her dog scratched her on the anterior aspect of her left thigh and she has got a nonhealing wound, it is about 2 inches long.  She also has what I would say venous hypertension in both of her legs and I am going to classify this wound as venous ulcer with inflammation.  This lady has had many surgeries before including a right hip and a right knee replacement.  She has also had a hysterectomy.  She has a history of hypertension and hyperlipidemia and esophageal reflux and diverticulosis.  Her medicines are not many; however, she takes Oxytrol, Valium, Aleve, and Cal-Citrate and vitamins.  The patient when she came here today was noted to have a blood pressure of 127/82, respirations 18, pulse 79, and temperature 97.5.  She weighs 160 pounds.  She is 5 feet 6 inches.  After evaluating her, I decided to put her in an Unna boot with some calcium alginate over the ulcers, and we will have her come back in a week.  So, her diagnosis is traumatic wound, anterior aspect of left leg with venous ulcer and inflammation.  She has got bilateral venous hypertension of her legs and she also has a history of hypertension.  I will see her back here in a week.     Judene Companion, M.D.     PP/MEDQ  D:  01/29/2013  T:  01/30/2013  Job:  [id number]

## 2013-02-12 ENCOUNTER — Encounter (HOSPITAL_BASED_OUTPATIENT_CLINIC_OR_DEPARTMENT_OTHER): Payer: Medicare Other | Attending: General Surgery

## 2013-02-12 DIAGNOSIS — L089 Local infection of the skin and subcutaneous tissue, unspecified: Secondary | ICD-10-CM | POA: Insufficient documentation

## 2013-02-12 DIAGNOSIS — L97809 Non-pressure chronic ulcer of other part of unspecified lower leg with unspecified severity: Secondary | ICD-10-CM | POA: Insufficient documentation

## 2013-02-15 ENCOUNTER — Encounter: Payer: Self-pay | Admitting: Podiatry

## 2013-02-15 ENCOUNTER — Ambulatory Visit (INDEPENDENT_AMBULATORY_CARE_PROVIDER_SITE_OTHER): Payer: Medicare Other | Admitting: Podiatry

## 2013-02-15 VITALS — BP 113/61 | HR 76 | Resp 16

## 2013-02-15 DIAGNOSIS — M79609 Pain in unspecified limb: Secondary | ICD-10-CM

## 2013-02-15 DIAGNOSIS — Q828 Other specified congenital malformations of skin: Secondary | ICD-10-CM

## 2013-02-15 NOTE — Progress Notes (Signed)
Subjective:     Patient ID: Caroline Griffith, female   DOB: Jul 15, 1930, 80 y.o.   MRN: [id number]  HPI patient presents with a lesion sub-first metatarsal both feet that are painful when pressed   Review of Systems     Objective:   Physical Exam Neurovascular status unchanged with wearing small cast on the left lower leg secondary to a lesion on the lower leg been treated at the wound care sent    Assessment:     Chronic lesion sub-1 both feet with pain    Plan:     Debridement of painful lesions with no bleeding noted

## 2013-04-23 ENCOUNTER — Emergency Department (HOSPITAL_COMMUNITY)
Admission: EM | Admit: 2013-04-23 | Discharge: 2013-04-23 | Disposition: A | Discharge status: Home / Self Care | Payer: Medicare Other | Attending: Emergency Medicine | Admitting: Emergency Medicine

## 2013-04-23 ENCOUNTER — Emergency Department (HOSPITAL_COMMUNITY): Payer: Medicare Other

## 2013-04-23 ENCOUNTER — Encounter (HOSPITAL_COMMUNITY): Payer: Self-pay | Admitting: Emergency Medicine

## 2013-04-23 DIAGNOSIS — Y93H2 Activity, gardening and landscaping: Secondary | ICD-10-CM | POA: Insufficient documentation

## 2013-04-23 DIAGNOSIS — S52502A Unspecified fracture of the lower end of left radius, initial encounter for closed fracture: Secondary | ICD-10-CM

## 2013-04-23 DIAGNOSIS — S52613A Displaced fracture of unspecified ulna styloid process, initial encounter for closed fracture: Secondary | ICD-10-CM

## 2013-04-23 DIAGNOSIS — Y929 Unspecified place or not applicable: Secondary | ICD-10-CM | POA: Insufficient documentation

## 2013-04-23 DIAGNOSIS — S52509A Unspecified fracture of the lower end of unspecified radius, initial encounter for closed fracture: Secondary | ICD-10-CM | POA: Insufficient documentation

## 2013-04-23 DIAGNOSIS — S52609A Unspecified fracture of lower end of unspecified ulna, initial encounter for closed fracture: Principal | ICD-10-CM

## 2013-04-23 DIAGNOSIS — W010XXA Fall on same level from slipping, tripping and stumbling without subsequent striking against object, initial encounter: Secondary | ICD-10-CM | POA: Insufficient documentation

## 2013-04-23 DIAGNOSIS — Z791 Long term (current) use of non-steroidal anti-inflammatories (NSAID): Secondary | ICD-10-CM | POA: Insufficient documentation

## 2013-04-23 DIAGNOSIS — Z79899 Other long term (current) drug therapy: Secondary | ICD-10-CM | POA: Insufficient documentation

## 2013-04-23 DIAGNOSIS — Z8669 Personal history of other diseases of the nervous system and sense organs: Secondary | ICD-10-CM | POA: Insufficient documentation

## 2013-04-23 HISTORY — DX: Unspecified macular degeneration: H35.30

## 2013-04-23 MED ORDER — OXYCODONE-ACETAMINOPHEN 5-325 MG PO TABS
1.0000 | ORAL_TABLET | Freq: Once | ORAL | Status: AC
  Administered 2013-04-23: 1 via ORAL
  Filled 2013-04-23: qty 1

## 2013-04-23 MED ORDER — OXYCODONE-ACETAMINOPHEN 5-325 MG PO TABS: 1.0000 | ORAL_TABLET | Freq: Four times a day (QID) | ORAL | Status: DC | PRN

## 2013-04-23 NOTE — Discharge Instructions (Signed)
Wrist Fracture A wrist fracture is a break in one of the bones of the wrist. Your wrist is made up of several small bones at the palm of your hand (carpal bones) and the two bones that make up your forearm (radius and ulna). The bones come together to form multiple large and small joints. The shape and design of these joints allow your wrist to bend and straighten, move side-to-side, and rotate, as in twisting your palm up or down. CAUSES  A fracture may occur in any of the bones in your wrist when enough force is applied to the wrist, such as when falling down onto an outstretched hand. Severe injuries may occur from a more forceful injury. SYMPTOMS Symptoms of wrist fractures include tenderness, bruising, and swelling. Additionally, the wrist may hang in an odd position or may be misshaped. DIAGNOSIS To diagnose a wrist fracture, your caregiver will physically examine your wrist. Your caregiver may also request an X-ray exam of your wrist. TREATMENT Treatment depends on many factors, including the nature and location of the fracture, your age, and your activity level. Treatment for wrist fracture can be nonsurgical or surgical. For nonsurgical treatment, a plaster cast or splint may be applied to your wrist if the bone is in a good position (aligned). The cast will stay on for about 6 weeks. If the alignment of your bone is not good, it may be necessary to realign (reduce) it. After the bone is reduced, a splint usually is placed on your wrist to allow for a small amount of normal swelling. After about 1 week, the splint is removed and a cast is added. The cast is removed 2 or 3 weeks later, after the swelling goes down, causing the cast to loosen. Another cast is applied. This cast is removed after about another 2 or 3 weeks, for a total of 4 to 6 weeks of immobilization. Sometimes the position of the bone is so far out of place that surgery is required to apply a device to hold it together as it  heals. If the bone cannot be reduced without cutting the skin around the bone (closed reduction), a cut (incision) is made to allow direct access to the bone to reduce it (open reduction). Depending on the fracture, there are a number of options for holding the bone in place while it heals, including a cast, metal pins, a plate and screws, and a device called an external fixator. With an external fixator, most of the hardware remains outside of the body. HOME CARE INSTRUCTIONS  To lessen swelling, keep your injured wrist elevated and move your fingers as much as possible.  Apply ice to your wrist for the first 1 to 2 days after you have been treated or as directed by your caregiver. Applying ice helps to reduce inflammation and pain.  Put ice in a plastic bag.  Place a towel between your skin and the bag.  Leave the ice on for 15 to 20 minutes at a time, every 2 hours while you are awake.  Do not put pressure on any part of your cast or splint. It may break.  Use a plastic bag to protect your cast or splint from water while bathing or showering. Do not lower your cast or splint into water.  Only take over-the-counter or prescription medicines for pain as directed by your caregiver. SEEK IMMEDIATE MEDICAL CARE IF:   Your cast or splint gets damaged or breaks.  You have continued severe pain   or more swelling than you did before the cast was put on.  Your skin or fingernails below the injury turn blue or gray or feel cold or numb.  You develop decreased feeling in your fingers. MAKE SURE YOU:  Understand these instructions.  Will watch your condition.  Will get help right away if you are not doing well or get worse. Document Released: 10/07/2004 Document Revised: 03/22/2011 Document Reviewed: 01/15/2011 [employer] Patient Information 2014 [employer].  

## 2013-04-23 NOTE — ED Provider Notes (Signed)
CSN: [id number]     Arrival date & time 04/23/13  1258 History   First MD Initiated Contact with Patient 04/23/13 1459     Chief Complaint  Patient presents with  . Fall     (Consider location/radiation/quality/duration/timing/severity/associated sxs/prior Treatment) Patient is a 81 y.o. female presenting with fall. The history is provided by the patient.  Fall This is a new problem. Pertinent negatives include no chest pain and no abdominal pain.   patient tripped while she was doing gardening. She has pain in her left wrist and thinks it is broken. No other complaints. No loss of conscious. She is not on anticoagulation. Her last meal was breakfast this morning. No numbness or weakness. There is some pain at the site.  Past Medical History  Diagnosis Date  . Macular degeneration    History reviewed. No pertinent past surgical history. No family history on file. History  Substance Use Topics  . Smoking status: Never Smoker   . Smokeless tobacco: Not on file  . Alcohol Use: Not on file   OB History   Grav Para Term Preterm Abortions TAB SAB Ect Mult Living                 Review of Systems  Respiratory: Negative for chest tightness.   Cardiovascular: Negative for chest pain.  Gastrointestinal: Negative for abdominal pain.  Musculoskeletal: Positive for joint swelling. Negative for gait problem, myalgias, neck pain and neck stiffness.  Skin: Negative for rash and wound.      Allergies  Sulfa antibiotics  Home Medications   Current Outpatient Rx  Name  Route  Sig  Dispense  Refill  . Calcium Carbonate-Vitamin D (CALTRATE 600+D) 600-400 MG-UNIT per tablet   Oral   Take 1 tablet by mouth 2 (two) times daily.         . Multiple Vitamins-Minerals (PRESERVISION AREDS 2) CAPS   Oral   Take 1 capsule by mouth 2 (two) times daily.         . naproxen sodium (ANAPROX) 220 MG tablet   Oral   Take 220 mg by mouth 2 (two) times daily with a meal.         .  oxyCODONE-acetaminophen (PERCOCET/ROXICET) 5-325 MG per tablet   Oral   Take 1-2 tablets by mouth every 6 (six) hours as needed for severe pain.   10 tablet   0    BP 121/52  Pulse 72  Temp(Src) 97.8 F (36.6 C) (Oral)  Resp 16  SpO2 99% Physical Exam  Constitutional: She is oriented to person, place, and time. She appears well-developed and well-nourished.  Cardiovascular: Normal rate and regular rhythm.   Pulmonary/Chest: Effort normal and breath sounds normal.  Abdominal: There is no tenderness.  Musculoskeletal: She exhibits tenderness.  Tenderness with mild deformity to left distal radius. Also tenderness over distal ulna. No tenderness over elbow. Range of motion intact over. Sensation intact over radial median and ulnar distribution of the left hand. Strong radial pulse. No tenderness at shoulder.  Neurological: She is alert and oriented to person, place, and time.    ED Course  Procedures (including critical care time) Labs Review Labs Reviewed - No data to display Imaging Review Dg Wrist Complete Left  04/23/2013   CLINICAL DATA:  History of fall complaining of left wrist pain.  EXAM: LEFT WRIST - COMPLETE 3+ VIEW  COMPARISON:  No priors.  FINDINGS: There is an acute mildly comminuted intra-articular fracture of the distal radius  which appears mildly impacted. Nondisplaced ulnar styloid avulsion fracture is also noted. Overlying soft tissues appear swollen. Remaining bones of the wrist otherwise appear intact.  IMPRESSION: 1. Acute mildly impacted and comminuted intra-articular fracture of the distal radius. 2. Acute nondisplaced fracture of the ulnar styloid.   Electronically Signed   By: Vinnie Langton M.D.   On: 04/23/2013 13:52     EKG Interpretation None      MDM   Final diagnoses:  Distal radius fracture, left  Fracture of ulnar styloid    Patient has a distal radius and ulnar styloid fracture. Discussed with hand surgery, patient was splinted and will  followup in the office. She'll be called later today or tomorrow by them to set up an appointment    Ovid Curd R. Alvino Chapel, MD 04/23/13 1027

## 2013-04-23 NOTE — ED Notes (Signed)
Ortho tech called to place sugartong splint.

## 2013-04-23 NOTE — ED Notes (Signed)
Patient was out in yard, lost balance and fell injuring her left wrist and bumped the right side of head.  No hematoma present on head or laceration.  No LOC.  Swelling to left wrist.

## 2013-04-23 NOTE — ED Notes (Signed)
Patient up to bathroom with assistance.

## 2013-06-07 ENCOUNTER — Encounter: Payer: Self-pay | Admitting: Podiatry

## 2013-06-07 ENCOUNTER — Ambulatory Visit (INDEPENDENT_AMBULATORY_CARE_PROVIDER_SITE_OTHER): Payer: Medicare Other | Admitting: Podiatry

## 2013-06-07 DIAGNOSIS — L84 Corns and callosities: Secondary | ICD-10-CM

## 2013-06-07 DIAGNOSIS — Q828 Other specified congenital malformations of skin: Secondary | ICD-10-CM

## 2013-06-07 NOTE — Progress Notes (Signed)
Subjective:     Patient ID: Caroline Griffith, female   DOB: October 08, 1930, 80 y.o.   MRN: [id number]  HPI patient presents with keratotic lesion sub-first metatarsal head left and right that are painful when pressed   Review of Systems     Objective:   Physical Exam Neurovascular status intact with keratotic lesion sub-first metatarsal both feet that are painful when pressed    Assessment:     Lesions of both feet secondary to foot structure    Plan:     Debridement of lesions first metatarsal both feet with no iatrogenic bleeding noted

## 2013-08-24 ENCOUNTER — Other Ambulatory Visit: Payer: Self-pay | Admitting: *Deleted

## 2013-08-24 DIAGNOSIS — I83893 Varicose veins of bilateral lower extremities with other complications: Secondary | ICD-10-CM

## 2013-08-27 ENCOUNTER — Ambulatory Visit (INDEPENDENT_AMBULATORY_CARE_PROVIDER_SITE_OTHER): Payer: Medicare Other | Admitting: Podiatry

## 2013-08-27 ENCOUNTER — Encounter: Payer: Self-pay | Admitting: Podiatry

## 2013-08-27 DIAGNOSIS — L84 Corns and callosities: Secondary | ICD-10-CM

## 2013-08-27 NOTE — Patient Instructions (Signed)
Corns and Calluses A thickening of the skin layer (usually over bony areas, such as toe joints) is known as a corn. Two types of corns exist: hard corns and soft corns. Calluses are painless areas of skin thickening that are caused by repeated pressure or irritation. Corns tend to affect toe joints and the skin between the toes; whereas, a callus can appear on any part of the body (especially the hands, feet, or knees).  SYMPTOMS   Corn:  Presence of a small (1/8 to 3/8 inch [3 to 10 mm in diameter]), painful bump on the side or over the joint of a toe.  Hard corns are more common on the outer portion of the little (fifth) toe at the joint.  Soft corns are more common between bony bumps (prominences), usually between the fourth and fifth toes or between the second and third toes.  Callus:  A rough, thickened area of skin that appears after repeated pressure or irritation. CAUSES  The purpose of corns and calluses is to protect an area of skin from injury caused by repeated irritation (rubbing or squeezing). The presence of pressure causes the skin cells to grow at a faster rate than the cells of unaffected areas. This leads to an overgrowth (corn or callus). As apposed to hard corns, soft corns tend to develop between toes, because there is more moisture. Soft corns are often the result of prolonged shoe wear, which leads to increased perspiration and moisture.  RISK INCREASES WITH:  Shoes that are too tight.  Occupations or sports that involve repetitive pressure on the hands (racquetball and baseball) or sudden stops on hard surfaces (track and tennis).  Sports that require the athlete to wear shoes, perspire, or wear clothing or protective gear that causes the production of heat and friction. PREVENTION  Properly fitted shoes and equipment.  Modify activities to prevent constant pressure on specific areas of skin.  If possible, wear padding over areas of skin that are exposed to  repeated pressure or irritation.  Keep the area between the toes dry (with powder or by removing shoes often).  Relieve shoe pressure by stretching the areas of the shoe that cause the pressure and or use ointments to soften leather shoes. PROGNOSIS  Corns and calluses typically subside if the activity that causes them is eliminated. Recovery may take up to 3 weeks. Recurrence is likely even with treatment if the cause is not removed.  RELATED COMPLICATIONS  If one overcompensates in an attempt to avoid pain, he or she may experience pain in other areas due to the changes in body movements (mechanics). TREATMENT  The best way to treat corns and calluses is to remove the source of pressure. Corn and callus pads may be helpful in reducing pressure on the affected skin. For soft corns, try to keep the affected area dry. If you cannot find shoes that fit properly, a shoe repair shop may be able to alter your shoes to reduce pressure. Occasionally a cushion for the bottom of the foot (metatarsal bar) worn within the shoe may relieve pressure on corns or calluses of the foot. For calluses, you may be able to peel or rub the thickened area with a pumice stone, sandstone, callus file, or with sandpaper to remove the callus; wetting the affected area may make this process more effective. Do not cut the corn or callus with a razor or knife. If the corn or callus must be removed, then a medically trained person should perform   the procedure. After peeling away the upper layers of a corn once or twice a day, it may be recommended to apply a non-prescription 5% to 10% salicylic ointment and cover the area with a bandage. It very uncommon to have the bony bumps (at toe joints) surgically removed. MEDICATION   If pain medication is necessary, nonsteroidal anti-inflammatory medications, such as aspirin and ibuprofen, or other minor pain relievers, such as acetaminophen, are often recommended. Contact your caregiver  immediately if any bleeding, stomach upset, or signs of an allergic reaction occur.  Topical salicylic ointments (5% to 10%) may be of benefit.  Prescription pain medications may be given by a caregiver. Use only as directed and only as much as you need.  Soak the foot for 20 minutes, twice a day, in a gallon of warm water. This may help to soften corns and calluses. Care should be taken to thoroughly dry the foot, especially between the toes, after soaking. SEEK MEDICAL CARE IF:   Symptoms get worse or do not improve in 2 weeks despite treatment.  Any signs of infection develop, including redness, swelling, increased pain or tenderness, or increased warmth around the corn or callus.  New, unexplained symptoms develop (drugs used in treatment may produce side effects). Document Released: 12/28/2004 Document Revised: 03/22/2011 Document Reviewed: 04/11/2008 [employer] Patient Information 2015 [employer]. This information is not intended to replace advice given to you by your health care provider. Make sure you discuss any questions you have with your health care provider.  

## 2013-08-29 NOTE — Progress Notes (Signed)
Subjective:     Patient ID: Caroline Griffith, female   DOB: 1930-09-23, 80 y.o.   MRN: [id number]  HPI patient is found to have thick keratotic lesion sub-first metatarsal of both feet   Review of Systems     Objective:   Physical Exam Neurovascular status intact with thick keratotic lesion sub-1 both feet    Assessment:     Lesion secondary to plantar flex metatarsals with pain and keratotic tissue formation    Plan:     Debridement lesions on both feet with no iatrogenic bleeding noted

## 2013-10-03 ENCOUNTER — Encounter: Payer: Self-pay | Admitting: Vascular Surgery

## 2013-10-04 ENCOUNTER — Ambulatory Visit (HOSPITAL_COMMUNITY)
Admission: RE | Admit: 2013-10-04 | Discharge: 2013-10-04 | Disposition: A | Discharge status: Home / Self Care | Payer: Medicare Other | Source: Ambulatory Visit | Attending: Vascular Surgery | Admitting: Vascular Surgery

## 2013-10-04 ENCOUNTER — Ambulatory Visit (INDEPENDENT_AMBULATORY_CARE_PROVIDER_SITE_OTHER): Payer: Medicare Other | Admitting: Vascular Surgery

## 2013-10-04 ENCOUNTER — Encounter: Payer: Self-pay | Admitting: Vascular Surgery

## 2013-10-04 VITALS — BP 127/57 | HR 72 | Temp 98.1°F | Resp 16 | Ht 66.0 in | Wt 158.0 lb

## 2013-10-04 DIAGNOSIS — I83893 Varicose veins of bilateral lower extremities with other complications: Secondary | ICD-10-CM

## 2013-10-04 NOTE — Progress Notes (Signed)
[facility] [place] HISTORY AND PHYSICAL   History of Present Illness:  Patient is a 80 y.o. year old female who presents for evaluation of bilateral lower extremity pain. The patient has very sensitive skin that is tender to touch in the pretibial area bilaterally. She states that this got worse after a right knee replacement several years ago. She denies prior history of DVT. She did have a history of a nonhealing wound on the lateral aspect of her left leg approximately one year ago. The wound several weeks to heal and she was followed at the wound center for this. Apparently she had a ruptured varicosity in this location and it bled fairly profusely as well. She has not had any other episodes. She has not worn compression stockings in the past. She occasionally has some ankle swelling. Her problems have been ongoing for 12 years.  Other medical problems include macular degeneration.  Past Medical History  Diagnosis Date  . Macular degeneration     No past surgical history on file.  Social History History  Substance Use Topics  . Smoking status: Never Smoker   . Smokeless tobacco: Not on file  . Alcohol Use: Not on file    Family History No family history on file.  Allergies  Allergies  Allergen Reactions  . Sulfa Antibiotics     Unknown    Current Outpatient Prescriptions on File Prior to Visit  Medication Sig Dispense Refill  . Calcium Carbonate-Vitamin D (CALTRATE 600+D) 600-400 MG-UNIT per tablet Take 1 tablet by mouth 2 (two) times daily.      . Multiple Vitamins-Minerals (PRESERVISION AREDS 2) CAPS Take 1 capsule by mouth 2 (two) times daily.      . naproxen sodium (ANAPROX) 220 MG tablet Take 220 mg by mouth 2 (two) times daily with a meal.      . oxyCODONE-acetaminophen (PERCOCET/ROXICET) 5-325 MG per tablet Take 1-2 tablets by mouth every 6 (six) hours as needed for severe pain.  10 tablet  0   No current facility-administered medications on file  prior to visit.    ROS:   General:  No weight loss, Fever, chills  HEENT: No recent headaches, no nasal bleeding, no visual changes, no sore throat  Neurologic: No dizziness, blackouts, seizures. No recent symptoms of stroke or mini- stroke. No recent episodes of slurred speech, or temporary blindness.  Cardiac: No recent episodes of chest pain/pressure, no shortness of breath at rest.  No shortness of breath with exertion.  Denies history of atrial fibrillation or irregular heartbeat  Vascular: No history of rest pain in feet.  No history of claudication.  + history of non-healing ulcer, No history of DVT   Pulmonary: No home oxygen, no productive cough, no hemoptysis,  No asthma or wheezing  Musculoskeletal:  [x ] Arthritis, [ ]  Low back pain,  [ ]  Joint pain  Hematologic:No history of hypercoagulable state.  No history of easy bleeding.  No history of anemia  Gastrointestinal: No hematochezia or melena,  No gastroesophageal reflux, no trouble swallowing  Urinary: [ ]  chronic Kidney disease, [ ]  on HD - [ ]  MWF or [ ]  TTHS, [ ]  Burning with urination, [ ]  Frequent urination, [ ]  Difficulty urinating;   Skin: No rashes  Psychological: No history of anxiety,  No history of depression   Physical Examination  General:  Alert and oriented, no acute distress HEENT: Normal Neck: No bruit or JVD Pulmonary: Clear to auscultation bilaterally Cardiac: Regular  Rate and Rhythm without murmur Abdomen: Soft, non-tender, non-distended, no mass Skin: No rash, bilateral brawny staining gaiter area lower extremities, a few scattered 4 mm varicosities on the posterior calf, no open ulcer Extremity Pulses:  2+ radial, brachial, femoral, dorsalis pedis pulses bilaterally Musculoskeletal: No deformity or edema  Neurologic: Upper and lower extremity motor 5/5 and symmetric  DATA:  Patient had bilateral venous duplex exam today. This showed reflux in her greater saphenous and saphenofemoral  junction with a vein diameter of 4-6 mm. She also had evidence of deep vein reflux.   ASSESSMENT:  Bilateral symptomatic superficial venous reflux with some deep component primarily thinning and sensitivity of skin   PLAN: The patient was given a prescription today for bilateral lower extremity thigh high compression stockings. She will followup in 3 months with Dr. Donnetta Hutching to consider whether or not she would be a candidate for laser ablation.  Ruta Hinds, MD [facility] Office: [phone] Pager: [phone]

## 2013-10-29 ENCOUNTER — Ambulatory Visit (INDEPENDENT_AMBULATORY_CARE_PROVIDER_SITE_OTHER): Payer: Medicare Other | Admitting: Podiatry

## 2013-10-29 ENCOUNTER — Encounter: Payer: Self-pay | Admitting: Podiatry

## 2013-10-29 DIAGNOSIS — L84 Corns and callosities: Secondary | ICD-10-CM

## 2013-10-29 NOTE — Progress Notes (Signed)
   Subjective:    Patient ID: Caroline Griffith, female    DOB: 22-Mar-1930, 80 y.o.   MRN: [id number]  HPI Pt presents for callus trim   Review of Systems     Objective:   Physical Exam        Assessment & Plan:

## 2013-10-30 NOTE — Progress Notes (Signed)
Subjective:     Patient ID: Caroline Griffith, female   DOB: [birth year], 80 y.o.   MRN: [id number]  HPI patient presents with painful lesions on both feet that she cannot cut   Review of Systems     Objective:   Physical Exam Neurovascular status unchanged with thick keratotic lesion of both feet    Assessment:     Chronic lesion formation secondary to foot structure    Plan:     Debris painful lesions on both feet with no iatrogenic bleeding noted

## 2013-12-20 ENCOUNTER — Ambulatory Visit (INDEPENDENT_AMBULATORY_CARE_PROVIDER_SITE_OTHER): Payer: Medicare Other | Admitting: Podiatry

## 2013-12-20 ENCOUNTER — Encounter: Payer: Self-pay | Admitting: Podiatry

## 2013-12-20 DIAGNOSIS — L84 Corns and callosities: Secondary | ICD-10-CM

## 2013-12-20 NOTE — Progress Notes (Signed)
Subjective:     Patient ID: Caroline Griffith, female   DOB: May 05, 1930, 80 y.o.   MRN: [id number]  HPI patient presents with painful lesions on both feet that she cannot cut   Review of Systems     Objective:   Physical Exam Neurovascular status unchanged with thick keratotic lesion of both feet    Assessment:     Chronic lesion formation secondary to foot structure    Plan:     Debris painful lesions on both feet with no iatrogenic bleeding noted

## 2013-12-22 NOTE — Progress Notes (Signed)
Subjective:     Patient ID: Caroline Griffith, female   DOB: 1930-07-06, 80 y.o.   MRN: [id number]  HPI patient presents with lesions plantar aspect both feet that become painful   Review of Systems     Objective:   Physical Exam Neurovascular status unchanged with thick keratotic lesion formation    Assessment:     Lesion secondary to foot structure that are painful    Plan:     Debridement lesions on both feet with no iatrogenic bleeding noted

## 2013-12-31 ENCOUNTER — Ambulatory Visit: Payer: Medicare Other | Admitting: Podiatry

## 2014-01-08 ENCOUNTER — Encounter: Payer: Self-pay | Admitting: Vascular Surgery

## 2014-01-15 ENCOUNTER — Ambulatory Visit: Payer: Medicare Other | Admitting: Vascular Surgery

## 2014-02-18 ENCOUNTER — Ambulatory Visit (INDEPENDENT_AMBULATORY_CARE_PROVIDER_SITE_OTHER): Payer: Medicare Other | Admitting: Podiatry

## 2014-02-18 DIAGNOSIS — M79673 Pain in unspecified foot: Secondary | ICD-10-CM

## 2014-02-18 DIAGNOSIS — L84 Corns and callosities: Secondary | ICD-10-CM

## 2014-02-18 NOTE — Progress Notes (Signed)
Subjective:     Patient ID: Caroline Griffith, female   DOB: Nov 09, 1930, 81 y.o.   MRN: [id number]  HPI patient presents with lesions plantar aspect both feet that become painful   Review of Systems     Objective:   Physical Exam Neurovascular status unchanged with thick keratotic lesion formation    Assessment:     Lesion secondary to foot structure that are painful    Plan:     Debridement lesions on both feet with no iatrogenic bleeding noted

## 2014-03-19 ENCOUNTER — Encounter: Payer: Self-pay | Admitting: Internal Medicine

## 2014-03-19 ENCOUNTER — Ambulatory Visit (INDEPENDENT_AMBULATORY_CARE_PROVIDER_SITE_OTHER)
Admission: RE | Admit: 2014-03-19 | Discharge: 2014-03-19 | Disposition: A | Discharge status: Home / Self Care | Payer: Medicare Other | Source: Ambulatory Visit | Attending: Internal Medicine | Admitting: Internal Medicine

## 2014-03-19 ENCOUNTER — Ambulatory Visit (INDEPENDENT_AMBULATORY_CARE_PROVIDER_SITE_OTHER): Payer: Medicare Other | Admitting: Internal Medicine

## 2014-03-19 ENCOUNTER — Other Ambulatory Visit (INDEPENDENT_AMBULATORY_CARE_PROVIDER_SITE_OTHER): Payer: Medicare Other

## 2014-03-19 VITALS — BP 124/68 | HR 75 | Ht 66.0 in | Wt 159.2 lb

## 2014-03-19 DIAGNOSIS — R0600 Dyspnea, unspecified: Secondary | ICD-10-CM

## 2014-03-19 DIAGNOSIS — R0602 Shortness of breath: Secondary | ICD-10-CM | POA: Insufficient documentation

## 2014-03-19 DIAGNOSIS — R918 Other nonspecific abnormal finding of lung field: Secondary | ICD-10-CM

## 2014-03-19 LAB — TSH: TSH: 1.18 u[IU]/mL (ref 0.35–4.50)

## 2014-03-19 LAB — CBC WITH DIFFERENTIAL/PLATELET
BASOS PCT: 0.4 % (ref 0.0–3.0)
Basophils Absolute: 0 10*3/uL (ref 0.0–0.1)
Eosinophils Absolute: 0.1 10*3/uL (ref 0.0–0.7)
Eosinophils Relative: 0.9 % (ref 0.0–5.0)
HCT: 41.3 % (ref 36.0–46.0)
Hemoglobin: 13.9 g/dL (ref 12.0–15.0)
Lymphocytes Relative: 21.4 % (ref 12.0–46.0)
Lymphs Abs: 1.5 10*3/uL (ref 0.7–4.0)
MCHC: 33.6 g/dL (ref 30.0–36.0)
MCV: 89.9 fl (ref 78.0–100.0)
Monocytes Absolute: 0.6 10*3/uL (ref 0.1–1.0)
Monocytes Relative: 8.3 % (ref 3.0–12.0)
NEUTROS ABS: 4.8 10*3/uL (ref 1.4–7.7)
Neutrophils Relative %: 69 % (ref 43.0–77.0)
Platelets: 320 10*3/uL (ref 150.0–400.0)
RBC: 4.6 Mil/uL (ref 3.87–5.11)
RDW: 13 % (ref 11.5–15.5)
WBC: 7 10*3/uL (ref 4.0–10.5)

## 2014-03-19 LAB — BASIC METABOLIC PANEL
BUN: 15 mg/dL (ref 6–23)
CO2: 30 mEq/L (ref 19–32)
CREATININE: 0.99 mg/dL (ref 0.40–1.20)
Calcium: 9.6 mg/dL (ref 8.4–10.5)
Chloride: 102 mEq/L (ref 96–112)
GFR: 57.06 mL/min — ABNORMAL LOW (ref >60.00)
Glucose, Bld: 89 mg/dL (ref 70–99)
Potassium: 4.2 mEq/L (ref 3.5–5.1)
Sodium: 138 mEq/L (ref 135–145)

## 2014-03-19 LAB — SEDIMENTATION RATE: SED RATE: 27 mm/h — AB (ref 0–22)

## 2014-03-19 LAB — BRAIN NATRIURETIC PEPTIDE: PRO B NATRI PEPTIDE: 47 pg/mL (ref 0.0–100.0)

## 2014-03-19 NOTE — Patient Instructions (Addendum)
GERD (REFLUX)  is an extremely common cause of respiratory symptoms just like yours , many times with no obvious heartburn at all.    It can be treated with medication, but also with lifestyle changes including avoidance of late meals, excessive alcohol, smoking cessation, and avoid fatty foods, chocolate, peppermint, colas, red wine, and acidic juices such as orange juice.  NO MINT OR MENTHOL PRODUCTS SO NO COUGH DROPS  USE SUGARLESS CANDY INSTEAD (Jolley ranchers or Stover's or Life Savers) or even ice chips will also do - the key is to swallow to prevent all throat clearing. NO OIL BASED VITAMINS - use powdered substitutes.    Please remember to go to the lab and x-ray department downstairs for your tests - we will call you with the results when they are available.     Please schedule a follow up office visit in 6 weeks, call sooner if needed with pfts.

## 2014-03-19 NOTE — Progress Notes (Signed)
Quick Note:  Spoke with pt and notified of results per Dr. Wert. Pt verbalized understanding and denied any questions.  ______ 

## 2014-03-19 NOTE — Progress Notes (Signed)
Subjective:    Patient ID: CALEYAH JR, female    DOB: 01-24-1930,  MRN: [id number]  HPI  25 yowf quit smoking in 1965 with new intolerance to treadmill ex since  2014 stopped Dec 2015 now referred by [facility] for abn CT Chest   03/19/2014 1st [facility] office visit/ Wert   Chief Complaint  Patient presents with  . Pulmonary Consult    Referred by Dr. Diona Fanti. Pt c/o SOB for the past 2 yrs, worse for the past year.  She states gets out of breath while walking short distances such as walking to her mailbox. She also c/o non prod cough on and off.   sob haul empty trash up slt incline x 2 years def worse since xmas 2015  Has chronic cough related to pnds no longer taking flonase, may have helped some Has large HH   No obvious other patterns in day to day or daytime variabilty or assoc chronic cough or cp or chest tightness, subjective wheeze overt sinus or hb symptoms. No unusual exp hx or h/o childhood pna/ asthma or knowledge of premature birth.  Sleeping ok without nocturnal  or early am exacerbation  of respiratory  c/o's or need for noct saba. Also denies any obvious fluctuation of symptoms with weather or environmental changes or other aggravating or alleviating factors except as outlined above   Current Medications, Allergies, Complete Past Medical History, Past Surgical History, Family History, and Social History were reviewed in [employer] record.             Review of Systems  Constitutional: Negative for fever, chills and unexpected weight change.  HENT: Negative for congestion, dental problem, ear pain, nosebleeds, postnasal drip, rhinorrhea, sinus pressure, sneezing, sore throat, trouble swallowing and voice change.   Eyes: Negative for visual disturbance.  Respiratory: Positive for cough and shortness of breath. Negative for choking.   Cardiovascular: Negative for chest pain and leg swelling.  Gastrointestinal: Negative for vomiting,  abdominal pain and diarrhea.  Genitourinary: Negative for difficulty urinating.  Musculoskeletal: Negative for arthralgias.  Skin: Negative for rash.  Neurological: Negative for tremors, syncope and headaches.  Hematological: Does not bruise/bleed easily.       Objective:   Physical Exam  amb wf with somewhat depressed appearing   Wt Readings from Last 3 Encounters:  03/19/14 159 lb 3.2 oz (72.213 kg)  10/04/13 158 lb (71.668 kg)    Vital signs reviewed   HEENT: nl dentition, turbinates, and orophanx. Nl external ear canals without cough reflex   NECK :  without JVD/Nodes/TM/ nl carotid upstrokes bilaterally   LUNGS: no acc muscle use, clear to A and P bilaterally without cough on insp or exp maneuvers   CV:  RRR  no s3 or murmur or increase in P2, no edema   ABD:  soft and nontender with nl excursion in the supine position. No bruits or organomegaly, bowel sounds nl  MS:  warm without deformities, calf tenderness, cyanosis or clubbing  SKIN: warm and dry without lesions    NEURO:  alert, approp, no deficits   CXR PA and Lateral:   03/19/2014 :     I personally reviewed images and agree with radiology impression as follows:    No active disease. Hyperinflation again noted. Large hiatal hernia with air-fluid level measures 7.3 x 7.6 cm. Stable air elongated spiculated density in left apex measures 6.5 mm x 2.6 cm stable nodule in left apex measures 6.5 mm.  Labs ordered/ reviewed     Recent Labs Lab 03/19/14 0934  NA 138  K 4.2  CL 102  CO2 30  BUN 15  CREATININE 0.99  GLUCOSE 89    Recent Labs Lab 03/19/14 0934  HGB 13.9  HCT 41.3  WBC 7.0  PLT 320.0     Lab Results  Component Value Date   TSH 1.18 03/19/2014     Lab Results  Component Value Date   PROBNP 47.0 03/19/2014     Lab Results  Component Value Date   ESRSEDRATE 27* 03/19/2014                  Assessment & Plan:

## 2014-03-20 ENCOUNTER — Encounter: Payer: Self-pay | Admitting: Internal Medicine

## 2014-03-20 DIAGNOSIS — R918 Other nonspecific abnormal finding of lung field: Secondary | ICD-10-CM | POA: Insufficient documentation

## 2014-03-20 NOTE — Assessment & Plan Note (Signed)
-   03/19/2014  Walked RA x 3 laps @ 185 ft each stopped due to  End of study, no desat, avg pace    Symptoms are markedly disproportionate to objective findings and not clear this is a lung problem but pt does appear to have difficult airway management issues. DDX of  difficult airways management all start with A and  include Adherence, Ace Inhibitors, Acid Reflux, Active Sinus Disease, Alpha 1 Antitripsin deficiency, Anxiety masquerading as Airways dz,  ABPA,  allergy(esp in young), Aspiration (esp in elderly), Adverse effects of DPI,  Active smokers, plus two Bs  = Bronchiectasis and Beta blocker use..and one C= CHF   ? Acid (or non-acid) GERD > always difficult to exclude as up to 75% of pts in some series report no assoc GI/ Heartburn symptoms> rec   diet restrictions/ reviewed and instructions given in writing.   ? Allergy/asthma > no variability to suggest this   Needs full pfts to sort out before considering doing anything invasive with the lung

## 2014-03-20 NOTE — Assessment & Plan Note (Addendum)
CT Chest 03/07/14 vs 2005 CT chest = new linear density LUL/ nodule RUL and multiple bilateral nodules x 6 mm   Although there are clearly abnormalities on CT scan, they should probably be considered "microscopic" since not obvious on plain cxr .     In the setting of obvious "macroscopic" health issues,  I am very reluctatnt to embark on an invasive w/u at this point but will arrange consevative  follow up and in the meantime see what we can do to address the patient's subjective concerns.    Plan will be to first eval sob and f/u pfts then proceed with PET scanning   Discussed in detail all the  indications, usual  risks and alternatives  relative to the benefits with patient who agrees to proceed with conservative f/u as outlined

## 2014-03-22 ENCOUNTER — Telehealth: Payer: Self-pay

## 2014-03-22 NOTE — Telephone Encounter (Signed)
Spoke with pt regarding painful callus in feet, advised her to do epsom salt soaks and take tylenol for pain relief and to keep her appt for next week

## 2014-04-01 ENCOUNTER — Ambulatory Visit (INDEPENDENT_AMBULATORY_CARE_PROVIDER_SITE_OTHER): Payer: Medicare Other | Admitting: Podiatry

## 2014-04-01 DIAGNOSIS — M779 Enthesopathy, unspecified: Secondary | ICD-10-CM

## 2014-04-01 DIAGNOSIS — L84 Corns and callosities: Secondary | ICD-10-CM | POA: Diagnosis not present

## 2014-04-01 MED ORDER — TRIAMCINOLONE ACETONIDE 10 MG/ML IJ SUSP
10.0000 mg | Freq: Once | INTRAMUSCULAR | Status: AC
  Administered 2014-04-01: 10 mg

## 2014-04-01 NOTE — Progress Notes (Signed)
Subjective:     Patient ID: Caroline Griffith, female   DOB: 11/21/1930, 81 y.o.   MRN: [id number]  HPI patient presents with lesions on both feet and states that this area on my left first metatarsal is very sore and I feel like the bunion is getting worse and also I'm getting a lot of pain on top of my feet both feet   Review of Systems     Objective:   Physical Exam Neurovascular status intact with no change in health history and noted to have inflammation around the first MPJ left with large structural deformity pain on top of the feet in general of both feet and lesion formation plantar    Assessment:     Inflammatory capsulitis first MPJ left with lesion formation bilateral and tendinitis symptoms bilateral    Plan:     Injected the left first MPJ 3 mg dexamethasone Kenalog and debrided lesions on both feet and we are start her on compounding creams to the dorsum of both feet to try to help with her chronic low grade pain she experiences I did discuss at great length her tendinitis symptomatology

## 2014-04-30 ENCOUNTER — Ambulatory Visit: Payer: Medicare Other | Admitting: Internal Medicine

## 2014-05-13 ENCOUNTER — Encounter: Payer: Self-pay | Admitting: Podiatry

## 2014-05-13 ENCOUNTER — Ambulatory Visit (INDEPENDENT_AMBULATORY_CARE_PROVIDER_SITE_OTHER): Payer: Medicare Other | Admitting: Podiatry

## 2014-05-13 DIAGNOSIS — L84 Corns and callosities: Secondary | ICD-10-CM | POA: Diagnosis not present

## 2014-05-14 NOTE — Progress Notes (Signed)
Subjective:     Patient ID: Caroline Griffith, female   DOB: 1930/10/17, 81 y.o.   MRN: [id number]  HPI patient presents with painful calluses on the bottom of both feet   Review of Systems     Objective:   Physical Exam Neurovascular status intact with keratotic lesion subsecond metatarsal bilateral that are painful    Assessment:     Lesion secondary to plantar flex metatarsals    Plan:     Debride lesions on both feet with no iatrogenic bleeding noted

## 2014-05-27 ENCOUNTER — Ambulatory Visit (INDEPENDENT_AMBULATORY_CARE_PROVIDER_SITE_OTHER): Payer: Medicare Other | Admitting: Internal Medicine

## 2014-05-27 ENCOUNTER — Encounter: Payer: Self-pay | Admitting: Internal Medicine

## 2014-05-27 VITALS — BP 112/68 | HR 80 | Ht 64.0 in | Wt 155.0 lb

## 2014-05-27 DIAGNOSIS — R0600 Dyspnea, unspecified: Secondary | ICD-10-CM

## 2014-05-27 DIAGNOSIS — R918 Other nonspecific abnormal finding of lung field: Secondary | ICD-10-CM | POA: Diagnosis not present

## 2014-05-27 LAB — PULMONARY FUNCTION TEST
DL/VA % pred: 82 %
DL/VA: 3.97 ml/min/mmHg/L
DLCO unc % pred: 74 %
DLCO unc: 18 ml/min/mmHg
FEF 25-75 Post: 0.83 L/s
FEF 25-75 Pre: 1.17 L/s
FEF2575-%Change-Post: -29 %
FEF2575-%Pred-Post: 64 %
FEF2575-%Pred-Pre: 90 %
FEV1-%Change-Post: -6 %
FEV1-%Pred-Post: 93 %
FEV1-%Pred-Pre: 99 %
FEV1-Post: 1.75 L
FEV1-Pre: 1.86 L
FEV1FVC-%Change-Post: 4 %
FEV1FVC-%Pred-Pre: 94 %
FEV6-%Change-Post: -8 %
FEV6-%Pred-Post: 101 %
FEV6-%Pred-Pre: 111 %
FEV6-Post: 2.41 L
FEV6-Pre: 2.63 L
FEV6FVC-%Change-Post: 1 %
FEV6FVC-%Pred-Post: 106 %
FEV6FVC-%Pred-Pre: 104 %
FVC-%Change-Post: -10 %
FVC-%Pred-Post: 96 %
FVC-%Pred-Pre: 106 %
FVC-Post: 2.41 L
FVC-Pre: 2.68 L
Post FEV1/FVC ratio: 72 %
Post FEV6/FVC ratio: 100 %
Pre FEV1/FVC ratio: 69 %
Pre FEV6/FVC Ratio: 98 %

## 2014-05-27 NOTE — Patient Instructions (Addendum)
We will call you the first week in July to arrange follow up (with PET)

## 2014-05-27 NOTE — Progress Notes (Signed)
PFT done today. 

## 2014-05-27 NOTE — Progress Notes (Signed)
Subjective:    Patient ID: Caroline Griffith, female    DOB: 02-27-1930,  MRN: [id number]    Brief patient profile:  5 yowf quit smoking in 1965 with new intolerance to treadmill ex since  2014 stopped Dec 2015 now referred by [facility] for abn CT Chest with w/u c/w MPN's with nl pfts documented 05/27/14    History of Present Illness  03/19/2014 1st [facility] office visit/ Wert   Chief Complaint  Patient presents with  . Pulmonary Consult    Referred by Dr. Diona Fanti. Pt c/o SOB for the past 2 yrs, worse for the past year.  She states gets out of breath while walking short distances such as walking to her mailbox. She also c/o non prod cough on and off.   sob haul empty trash up slt incline x 2 years def worse since xmas 2015  Has chronic cough related to pnds no longer taking flonase, may have helped some Has large HH  rec GERD  Diet only for now   05/27/2014 f/u ov/Wert re: unexplained doe/ mpns Chief Complaint  Patient presents with  . Follow-up    PFT done today. Pt states her breathing is unchanged "I did'nt know it was so bad until I came here".     No obvious day to day or daytime variabilty or assoc chronic cough or cp or chest tightness, subjective wheeze overt sinus or hb symptoms. No unusual exp hx or h/o childhood pna/ asthma or knowledge of premature birth.  Sleeping ok without nocturnal  or early am exacerbation  of respiratory  c/o's or need for noct saba. Also denies any obvious fluctuation of symptoms with weather or environmental changes or other aggravating or alleviating factors except as outlined above   Current Medications, Allergies, Complete Past Medical History, Past Surgical History, Family History, and Social History were reviewed in [employer] record.  ROS  The following are not active complaints unless bolded sore throat, dysphagia, dental problems, itching, sneezing,  nasal congestion or excess/ purulent secretions, ear ache,    fever, chills, sweats, unintended wt loss, pleuritic or exertional cp, hemoptysis,  orthopnea pnd or leg swelling, presyncope, palpitations, heartburn, abdominal pain, anorexia, nausea, vomiting, diarrhea  or change in bowel or urinary habits, change in stools or urine, dysuria,hematuria,  rash, arthralgias, visual complaints, headache, numbness weakness or ataxia or problems with walking or coordination,  change in mood/affect or memory.                          Objective:   Physical Exam  amb wf with somewhat depressed appearing   05/27/2014         155  Wt Readings from Last 3 Encounters:  03/19/14 159 lb 3.2 oz (72.213 kg)  10/04/13 158 lb (71.668 kg)    Vital signs reviewed   HEENT: nl dentition, turbinates, and orophanx. Nl external ear canals without cough reflex   NECK :  without JVD/Nodes/TM/ nl carotid upstrokes bilaterally   LUNGS: no acc muscle use, clear to A and P bilaterally without cough on insp or exp maneuvers   CV:  RRR  no s3 or murmur or increase in P2, no edema   ABD:  soft and nontender with nl excursion in the supine position. No bruits or organomegaly, bowel sounds nl  MS:  warm without deformities, calf tenderness, cyanosis or clubbing  SKIN: warm and dry without lesions    NEURO:  alert, approp, no deficits   CXR PA and Lateral:   03/19/2014 :     I personally reviewed images and agree with radiology impression as follows:    No active disease. Hyperinflation again noted. Large hiatal hernia with air-fluid level measures 7.3 x 7.6 cm. Stable air elongated spiculated density in left apex measures 6.5 mm x 2.6 cm stable nodule in left apex measures 6.5 mm.    Labs ordered/ reviewed    Lab 03/19/14 0934  NA 138  K 4.2  CL 102  CO2 30  BUN 15  CREATININE 0.99  GLUCOSE 89    Recent Labs Lab 03/19/14 0934  HGB 13.9  HCT 41.3  WBC 7.0  PLT 320.0     Lab Results  Component Value Date   TSH 1.18 03/19/2014     Lab  Results  Component Value Date   PROBNP 47.0 03/19/2014     Lab Results  Component Value Date   ESRSEDRATE 27* 03/19/2014                  Assessment & Plan:

## 2014-05-28 ENCOUNTER — Encounter: Payer: Self-pay | Admitting: Internal Medicine

## 2014-05-28 NOTE — Assessment & Plan Note (Addendum)
CT Chest 03/07/14 vs 2005 CT chest = new linear density LUL/ nodule RUL and multiple bilateral nodules x 6 mm - rec PET scan mid July 2016 (tickle file)  These could be inflammatory or malignant and pet now not a great option but if do in 2 months that would given [place] a roughly 6 m comparison study and also the PET information which together should help determine  which if any of these lesions needs bx in the low but not zero risk setting  Discussed in detail all the  indications, usual  risks and alternatives  relative to the benefits with patient who agrees to proceed with  F/u as outlined

## 2014-05-28 NOTE — Assessment & Plan Note (Signed)
-   03/19/2014  Walked RA x 3 laps @ 185 ft each stopped due to  End of study, no desat, avg pace  - 05/27/2014  Walked RA x 3 laps @ 185 ft each stopped due to  End of study, brisk pace, no desats - 05/27/14 pfts wnl   I had an extended discussion with the patient reviewing all relevant studies completed to date and  lasting 15 to 20 minutes of a 25 minute visit on the following ongoing concerns:  1) no explanation at all for her sob except for deconditioning >rec reconditioning

## 2014-06-14 ENCOUNTER — Other Ambulatory Visit: Payer: Self-pay | Admitting: Internal Medicine

## 2014-06-14 DIAGNOSIS — R918 Other nonspecific abnormal finding of lung field: Secondary | ICD-10-CM

## 2014-06-17 ENCOUNTER — Encounter: Payer: Self-pay | Admitting: Podiatry

## 2014-06-17 ENCOUNTER — Ambulatory Visit (INDEPENDENT_AMBULATORY_CARE_PROVIDER_SITE_OTHER): Payer: Medicare Other | Admitting: Podiatry

## 2014-06-17 ENCOUNTER — Ambulatory Visit (INDEPENDENT_AMBULATORY_CARE_PROVIDER_SITE_OTHER): Payer: Medicare Other

## 2014-06-17 VITALS — BP 114/63 | HR 68 | Resp 16

## 2014-06-17 DIAGNOSIS — M2012 Hallux valgus (acquired), left foot: Secondary | ICD-10-CM | POA: Diagnosis not present

## 2014-06-17 DIAGNOSIS — R52 Pain, unspecified: Secondary | ICD-10-CM | POA: Diagnosis not present

## 2014-06-17 DIAGNOSIS — M21612 Bunion of left foot: Secondary | ICD-10-CM

## 2014-06-17 DIAGNOSIS — L84 Corns and callosities: Secondary | ICD-10-CM

## 2014-06-17 NOTE — Patient Instructions (Signed)
Pre-Operative Instructions  Congratulations, you have decided to take an important step to improving your quality of life.  You can be assured that the doctors of [facility] will be with you every step of the way.  1. Plan to be at the surgery center/hospital at least 1 (one) hour prior to your scheduled time unless otherwise directed by the surgical center/hospital staff.  You must have a responsible adult accompany you, remain during the surgery and drive you home.  Make sure you have directions to the surgical center/hospital and know how to get there on time. 2. For hospital based surgery you will need to obtain a history and physical form from your family physician within 1 month prior to the date of surgery- we will give you a form for you primary physician.  3. We make every effort to accommodate the date you request for surgery.  There are however, times where surgery dates or times have to be moved.  We will contact you as soon as possible if a change in schedule is required.   4. No Aspirin/Ibuprofen for one week before surgery.  If you are on aspirin, any non-steroidal anti-inflammatory medications (Mobic, Aleve, Ibuprofen) you should stop taking it 7 days prior to your surgery.  You make take Tylenol  For pain prior to surgery.  5. Medications- If you are taking daily heart and blood pressure medications, seizure, reflux, allergy, asthma, anxiety, pain or diabetes medications, make sure the surgery center/hospital is aware before the day of surgery so they may notify you which medications to take or avoid the day of surgery. 6. No food or drink after midnight the night before surgery unless directed otherwise by surgical center/hospital staff. 7. No alcoholic beverages 24 hours prior to surgery.  No smoking 24 hours prior to or 24 hours after surgery. 8. Wear loose pants or shorts- loose enough to fit over bandages, boots, and casts. 9. No slip on shoes, sneakers are best. 10. Bring  your boot with you to the surgery center/hospital.  Also bring crutches or a walker if your physician has prescribed it for you.  If you do not have this equipment, it will be provided for you after surgery. 11. If you have not been contracted by the surgery center/hospital by the day before your surgery, call to confirm the date and time of your surgery. 12. Leave-time from work may vary depending on the type of surgery you have.  Appropriate arrangements should be made prior to surgery with your employer. 13. Prescriptions will be provided immediately following surgery by your doctor.  Have these filled as soon as possible after surgery and take the medication as directed. 14. Remove nail polish on the operative foot. 15. Wash the night before surgery.  The night before surgery wash the foot and leg well with the antibacterial soap provided and water paying special attention to beneath the toenails and in between the toes.  Rinse thoroughly with water and dry well with a towel.  Perform this wash unless told not to do so by your physician.  Enclosed: 1 Ice pack (please put in freezer the night before surgery)   1 Hibiclens skin cleaner   Pre-op Instructions  If you have any questions regarding the instructions, do not hesitate to call our office.  [place]: [address] [place], [place]  [address] [phone]  [place]: [address] [place], [place] [address] [phone]  [place]: [address]  [place], [place] [address] [phone]  Dr. Richard   Tuchman DPM, Dr. Norman Regal DPM Dr. Richard Sikora DPM, Dr. M. Todd Hyatt DPM, Dr. Kathryn Egerton DPM 

## 2014-06-18 ENCOUNTER — Encounter (HOSPITAL_COMMUNITY): Payer: Self-pay

## 2014-06-18 ENCOUNTER — Emergency Department (HOSPITAL_COMMUNITY)
Admission: EM | Admit: 2014-06-18 | Discharge: 2014-06-18 | Disposition: A | Discharge status: Home / Self Care | Payer: Medicare Other | Attending: Emergency Medicine | Admitting: Emergency Medicine

## 2014-06-18 DIAGNOSIS — Z23 Encounter for immunization: Secondary | ICD-10-CM | POA: Insufficient documentation

## 2014-06-18 DIAGNOSIS — S51012A Laceration without foreign body of left elbow, initial encounter: Secondary | ICD-10-CM | POA: Diagnosis not present

## 2014-06-18 DIAGNOSIS — Z87891 Personal history of nicotine dependence: Secondary | ICD-10-CM | POA: Diagnosis not present

## 2014-06-18 DIAGNOSIS — Z79899 Other long term (current) drug therapy: Secondary | ICD-10-CM | POA: Insufficient documentation

## 2014-06-18 DIAGNOSIS — Y9389 Activity, other specified: Secondary | ICD-10-CM | POA: Insufficient documentation

## 2014-06-18 DIAGNOSIS — W010XXA Fall on same level from slipping, tripping and stumbling without subsequent striking against object, initial encounter: Secondary | ICD-10-CM | POA: Diagnosis not present

## 2014-06-18 DIAGNOSIS — Y998 Other external cause status: Secondary | ICD-10-CM | POA: Diagnosis not present

## 2014-06-18 DIAGNOSIS — Y9289 Other specified places as the place of occurrence of the external cause: Secondary | ICD-10-CM | POA: Diagnosis not present

## 2014-06-18 MED ORDER — TETANUS-DIPHTH-ACELL PERTUSSIS 5-2.5-18.5 LF-MCG/0.5 IM SUSP
0.5000 mL | Freq: Once | INTRAMUSCULAR | Status: AC
  Administered 2014-06-18: 0.5 mL via INTRAMUSCULAR
  Filled 2014-06-18: qty 0.5

## 2014-06-18 MED ORDER — CEPHALEXIN 500 MG PO CAPS: 500.0000 mg | ORAL_CAPSULE | Freq: Three times a day (TID) | ORAL | Status: DC

## 2014-06-18 MED ORDER — ACETAMINOPHEN 500 MG PO TABS
1000.0000 mg | ORAL_TABLET | Freq: Once | ORAL | Status: AC
  Administered 2014-06-18: 1000 mg via ORAL
  Filled 2014-06-18: qty 2

## 2014-06-18 NOTE — ED Notes (Signed)
Patient verbalized understanding of papers. Denies any questions at this time.

## 2014-06-18 NOTE — Discharge Instructions (Signed)
Skin Tear Care  A skin tear is a wound in which the top layer of skin has peeled off. This is a common problem with aging because the skin becomes thinner and more fragile as a person gets older. In addition, some medicines, such as oral corticosteroids, can lead to skin thinning if taken for long periods of time.   A skin tear is often repaired with tape or skin adhesive strips. This keeps the skin that has been peeled off in contact with the healthier skin beneath. Depending on the location of the wound, a bandage (dressing) may be applied over the tape or skin adhesive strips. Sometimes, during the healing process, the skin turns black and dies. Even when this happens, the torn skin acts as a good dressing until the skin underneath gets healthier and repairs itself.  HOME CARE INSTRUCTIONS   · Change dressings once per day or as directed by your caregiver.  ¨ Gently clean the skin tear and the area around the tear using saline solution or mild soap and water.  ¨ Do not rub the injured skin dry. Let the area air dry.  ¨ Apply petroleum jelly or an antibiotic cream or ointment to keep the tear moist. This will help the wound heal. Do not allow a scab to form.  ¨ If the dressing sticks before the next dressing change, moisten it with warm soapy water and gently remove it.  · Protect the injured skin until it has healed.  · Only take over-the-counter or prescription medicines as directed by your caregiver.  · Take showers or baths using warm soapy water. Apply a new dressing after the shower or bath.  · Keep all follow-up appointments as directed by your caregiver.    SEEK IMMEDIATE MEDICAL CARE IF:   · You have redness, swelling, or increasing pain in the skin tear.  · You have pus coming from the skin tear.  · You have chills.  · You have a red streak that goes away from the skin tear.  · You have a bad smell coming from the tear or dressing.  · You have a fever or persistent symptoms for more than 2-3 days.  · You  have a fever and your symptoms suddenly get worse.  MAKE SURE YOU:  · Understand these instructions.  · Will watch this condition.  · Will get help right away if your child is not doing well or gets worse.  Document Released: 09/22/2000 Document Revised: 09/22/2011 Document Reviewed: 07/12/2011  Exit[employer]® Patient Information ©2015 [employer]. This information is not intended to replace advice given to you by your health care provider. Make sure you discuss any questions you have with your health care provider.

## 2014-06-18 NOTE — ED Notes (Signed)
Pt tripped going into her garden and fell. Has a large skin tear to her left lower leg.

## 2014-06-18 NOTE — ED Provider Notes (Signed)
CSN: [id number]     Arrival date & time 06/18/14  1156 History   First MD Initiated Contact with Patient 06/18/14 1358     Chief Complaint  Patient presents with  . Fall  . skin tear      Patient is a 81 y.o. female presenting with fall. The history is provided by the patient. No language interpreter was used.  Fall   Caroline Griffith presents for evaluation of a leg injury. She was working in the garden today and tripped on some leaves and fell to the ground. She struck her left shin on some weeds and sustained a tear to her left shin. She had no head injury or loss of consciousness. She is able to ambulate and bear weight without difficulty. She has no significant medical problems other than macular degeneration. Her tetanus shot is up-to-date. Symptoms are mild and constant.  Past Medical History  Diagnosis Date  . Macular degeneration   . Varicose veins   . Arthritis    Past Surgical History  Procedure Laterality Date  . Bunionectomy Right   . Joint replacement Right     Total hip replacement  . Joint replacement Right     Total knee replacement  . Orif wrist fracture Left   . Abdominal hysterectomy    . Tonsillectomy     Family History  Problem Relation Age of Onset  . Heart disease Mother   . Asthma Mother   . Heart disease Father   . Emphysema Mother     smoked   History  Substance Use Topics  . Smoking status: Former Smoker -- 0.25 packs/day for 14 years    Types: Cigarettes    Quit date: 01/12/1963  . Smokeless tobacco: Never Used  . Alcohol Use: No   OB History    No data available     Review of Systems  All other systems reviewed and are negative.     Allergies  Sulfa antibiotics  Home Medications   Prior to Admission medications   Medication Sig Start Date End Date Taking? Authorizing Provider  Calcium Carbonate-Vitamin D (CALTRATE 600+D) 600-400 MG-UNIT per tablet Take 1 tablet by mouth daily.     Historical Provider, MD  LORazepam (ATIVAN) 0.5  MG tablet Take 0.5 mg by mouth 2 (two) times daily as needed for anxiety.    Historical Provider, MD  Multiple Vitamins-Minerals (PRESERVISION AREDS 2) CAPS Take 1 capsule by mouth 2 (two) times daily.    Historical Provider, MD  naproxen sodium (ANAPROX) 220 MG tablet Take 220 mg by mouth 2 (two) times daily with a meal.    Historical Provider, MD  oxybutynin (OXYTROL) 3.9 MG/24HR Place 1 patch onto the skin every 3 (three) days.    Historical Provider, MD   BP 114/66 mmHg  Pulse 86  Temp(Src) 97.7 F (36.5 C) (Oral)  Resp 18  Ht 5\' 5"  (1.651 m)  Wt 160 lb (72.576 kg)  BMI 26.63 kg/m2  SpO2 95% Physical Exam  Constitutional: She is oriented to person, place, and time. She appears well-developed and well-nourished.  HENT:  Head: Normocephalic and atraumatic.  Pulmonary/Chest: Effort normal. No respiratory distress.  Musculoskeletal:  Large skin tear to left anterior shin approximately 5 x 5 cm in size. No bony tenderness or surrounding swelling. Able to range LLE without difficulty.  Neurological: She is alert and oriented to person, place, and time.  Skin: Skin is warm and dry.  Psychiatric: She has a normal mood and  affect. Her behavior is normal.  Nursing note and vitals reviewed.   ED Course  Procedures (including critical care time) Labs Review Labs Reviewed - No data to display  Imaging Review No results found.   EKG Interpretation None      MDM   Final diagnoses:  Skin tear of elbow without complication, left, initial encounter   Patient here for evaluation of skin tear of the left circumflex; a mechanical fall. There was a small amount of debris in the lid and it was from it with irrigation. There is no laceration to repair. Discussed with patient home wound care as well as need for follow-up for repeat evaluation. Discuss return precautions for evidence of infection. Given the contamination of the wounds treated with prophylactic Keflex. Patient states that her  tetanus shot is up-to-date.    Quintella Reichert, MD 06/18/14 [phone]

## 2014-06-18 NOTE — ED Provider Notes (Signed)
GERICA KOBLE 81 y.o. May 08, 1930  Assisting in wound care for Mrs. Jodell Cipro.  She is requesting pain medicine, aleve or tylenol.  Will order aleve before further irrigation of debris in skin tear.  Delsa Grana, PA-C 3:28 PM   The wound is cleansed, debrided of foreign material as much as possible, and dressed.  Edges of the skin tear were approximated.  Xeroform was applied, followed by guaze and cordex with paper tape.  Pt was educated about how to change the dressing.  The patient is alerted to watch for any signs of infection (redness, pus, pain, increased swelling or fever) and call if such occurs. Home wound care instructions are provided. Tetanus vaccination status reviewed: she reports tetanus has not been longer than 5 years ago.  Delsa Grana, PA-C 4:47 PM    Delsa Grana, PA-C 06/18/14 1647  Quintella Reichert, MD 06/19/14 [phone]

## 2014-06-18 NOTE — ED Notes (Signed)
Patient ambulated with no trouble.

## 2014-06-19 NOTE — Progress Notes (Signed)
Subjective:     Patient ID: Caroline Griffith, female   DOB: 07-06-1930, 81 y.o.   MRN: [id number]  HPI patient states this bunion on my left foot is really bothering me and I'm not able to wear my shoe gear comfortably. Also have these calluses on the bottom of both my feet   Review of Systems     Objective:   Physical Exam Neurovascular status found to be intact with good digital perfusion noted 1-5 bilateral and a red painful first metatarsal head left that is making it increasingly difficult for her to wear shoe gear with comfortably with plantar keratotic lesions underneath the first metatarsal of both feet    Assessment:     Structural HAV deformity left which is becoming increasingly more symptomatic along with lesion formation plantar aspect of both feet    Plan:     Reviewed condition at great length and x-ray the left foot. I do think we could do a moderate type procedure by removing the bone from the medial side and then moving the bone over slightly to reduce the ankle which would give her relief of the pain she is experiencing on a daily basis. Patient wants to pursue this and at this time I did allow her to read a consent form reviewing alternative treatments and complications. Patient understands the consent form and understands risk and at this time signed consent form and will be tentatively scheduled for a outpatient type procedure area she is encouraged to call with questions and today debridement was accomplished

## 2014-06-20 ENCOUNTER — Ambulatory Visit (HOSPITAL_COMMUNITY): Payer: Medicare Other

## 2014-07-16 ENCOUNTER — Ambulatory Visit (HOSPITAL_COMMUNITY): Payer: Medicare Other

## 2014-07-17 ENCOUNTER — Ambulatory Visit (HOSPITAL_COMMUNITY)
Admission: RE | Admit: 2014-07-17 | Discharge: 2014-07-17 | Disposition: A | Discharge status: Home / Self Care | Payer: Medicare Other | Source: Ambulatory Visit | Attending: Internal Medicine | Admitting: Internal Medicine

## 2014-07-17 DIAGNOSIS — R918 Other nonspecific abnormal finding of lung field: Secondary | ICD-10-CM | POA: Insufficient documentation

## 2014-07-17 LAB — GLUCOSE, CAPILLARY: Glucose-Capillary: 92 mg/dL (ref 65–99)

## 2014-07-17 MED ORDER — FLUDEOXYGLUCOSE F - 18 (FDG) INJECTION
7.7500 | Freq: Once | INTRAVENOUS | Status: AC | PRN
  Administered 2014-07-17: 7.75 via INTRAVENOUS

## 2014-07-18 ENCOUNTER — Telehealth: Payer: Self-pay | Admitting: *Deleted

## 2014-07-18 DIAGNOSIS — N200 Calculus of kidney: Secondary | ICD-10-CM

## 2014-07-18 NOTE — Telephone Encounter (Signed)
ATC home # x 3 and line busy ATC mobile # NA, and mailbox was full

## 2014-07-18 NOTE — Telephone Encounter (Signed)
-----   Message from Tanda Rockers, MD sent at 07/18/2014  1:06 PM EDT ----- I accidentally closed her PET scan but she has a kidney stone we need to find out about if she knew it / has symptoms from it and  Refer to urology asap and can't get her at either number

## 2014-07-18 NOTE — Telephone Encounter (Signed)
Pt returning call and can be reached @ [phone] cell # [phone].Caroline Griffith

## 2014-07-19 NOTE — Telephone Encounter (Signed)
Pt returning call again.Caroline Griffith ° °

## 2014-07-22 NOTE — Telephone Encounter (Signed)
Pt aware of results per MW Pt also aware that Urology referral to be placed - pt is established at [facility] - sees Dr Diona Fanti Order placed. Aware that someone will be contacting her with an appt.  Pt aware to follow up with Dr Melvyn Novas once appt with Urologist is complete.  Nothing further needed.

## 2014-07-22 NOTE — Telephone Encounter (Signed)
Patient is wanting to know if anything showed up for her lungs. Patient is not feeling that the kidney stone is urgent and states that she wants to know about her lungs.  Pt states that if lungs are okay then she will do the Urology referral.  Please advise Dr Melvyn Novas.

## 2014-07-22 NOTE — Telephone Encounter (Signed)
Sorry I closed her note but her lungs are fine though results are not completely conclusive - the kidney stone could become very serious as it is blocking her kidney per the study - would like to regroup with her p completes urology f/u to go over longterm f/u options for the lung issues

## 2014-07-22 NOTE — Telephone Encounter (Signed)
Pt call patient today. She has been waiting on a call since thursday and is very upset. [phone]

## 2014-07-24 ENCOUNTER — Telehealth: Payer: Self-pay | Admitting: Internal Medicine

## 2014-07-24 NOTE — Telephone Encounter (Signed)
I meant once she's finished up with any w/u he plans we can see her back to regroup but I don't need to see her for at least 6 weeks as all I wanted to go was regroup for long term f/u > nothing needed at all for now for lungs

## 2014-07-24 NOTE — Telephone Encounter (Signed)
Spoke with the pt and notified of recs per MW  She verbalized understanding  Nothing further needed 

## 2014-07-24 NOTE — Telephone Encounter (Signed)
Spoke with the pt  She states that Dr Melvyn Novas told her to call her as soon as she was leaving Dr. Noni Saupe office  She had appt with him this am  Dr Melvyn Novas, please advise thanks

## 2014-07-26 ENCOUNTER — Encounter (HOSPITAL_BASED_OUTPATIENT_CLINIC_OR_DEPARTMENT_OTHER): Payer: Medicare Other | Attending: Internal Medicine

## 2014-07-26 DIAGNOSIS — I83222 Varicose veins of left lower extremity with both ulcer of calf and inflammation: Secondary | ICD-10-CM | POA: Insufficient documentation

## 2014-07-26 DIAGNOSIS — I739 Peripheral vascular disease, unspecified: Secondary | ICD-10-CM | POA: Insufficient documentation

## 2014-07-26 DIAGNOSIS — L97221 Non-pressure chronic ulcer of left calf limited to breakdown of skin: Secondary | ICD-10-CM | POA: Diagnosis present

## 2014-07-26 DIAGNOSIS — M1990 Unspecified osteoarthritis, unspecified site: Secondary | ICD-10-CM | POA: Diagnosis not present

## 2014-07-29 ENCOUNTER — Other Ambulatory Visit: Payer: Self-pay | Admitting: Urology

## 2014-08-02 DIAGNOSIS — L97221 Non-pressure chronic ulcer of left calf limited to breakdown of skin: Secondary | ICD-10-CM | POA: Diagnosis not present

## 2014-08-02 DIAGNOSIS — M1990 Unspecified osteoarthritis, unspecified site: Secondary | ICD-10-CM | POA: Diagnosis not present

## 2014-08-02 DIAGNOSIS — I83222 Varicose veins of left lower extremity with both ulcer of calf and inflammation: Secondary | ICD-10-CM | POA: Diagnosis not present

## 2014-08-02 DIAGNOSIS — I739 Peripheral vascular disease, unspecified: Secondary | ICD-10-CM | POA: Diagnosis not present

## 2014-08-09 ENCOUNTER — Encounter (HOSPITAL_COMMUNITY)
Admission: RE | Admit: 2014-08-09 | Discharge: 2014-08-09 | Disposition: A | Discharge status: Home / Self Care | Payer: Medicare Other | Source: Ambulatory Visit | Attending: Urology | Admitting: Urology

## 2014-08-09 ENCOUNTER — Encounter (INDEPENDENT_AMBULATORY_CARE_PROVIDER_SITE_OTHER): Payer: Self-pay

## 2014-08-09 ENCOUNTER — Encounter (HOSPITAL_COMMUNITY): Payer: Self-pay

## 2014-08-09 DIAGNOSIS — I83222 Varicose veins of left lower extremity with both ulcer of calf and inflammation: Secondary | ICD-10-CM | POA: Diagnosis not present

## 2014-08-09 DIAGNOSIS — Z01810 Encounter for preprocedural cardiovascular examination: Secondary | ICD-10-CM | POA: Diagnosis present

## 2014-08-09 DIAGNOSIS — I739 Peripheral vascular disease, unspecified: Secondary | ICD-10-CM | POA: Diagnosis not present

## 2014-08-09 DIAGNOSIS — N200 Calculus of kidney: Secondary | ICD-10-CM | POA: Diagnosis not present

## 2014-08-09 DIAGNOSIS — M1990 Unspecified osteoarthritis, unspecified site: Secondary | ICD-10-CM | POA: Diagnosis not present

## 2014-08-09 DIAGNOSIS — L97221 Non-pressure chronic ulcer of left calf limited to breakdown of skin: Secondary | ICD-10-CM | POA: Diagnosis not present

## 2014-08-09 DIAGNOSIS — Z01812 Encounter for preprocedural laboratory examination: Secondary | ICD-10-CM | POA: Insufficient documentation

## 2014-08-09 HISTORY — DX: Chronic kidney disease, unspecified: N18.9

## 2014-08-09 HISTORY — DX: Personal history of other diseases of the digestive system: Z87.19

## 2014-08-09 HISTORY — DX: Pneumonia, unspecified organism: J18.9

## 2014-08-09 LAB — CBC
HEMATOCRIT: 42.7 % (ref 36.0–46.0)
Hemoglobin: 13.8 g/dL (ref 12.0–15.0)
MCH: 30.1 pg (ref 26.0–34.0)
MCHC: 32.3 g/dL (ref 30.0–36.0)
MCV: 93.2 fL (ref 78.0–100.0)
Platelets: 289 10*3/uL (ref 150–400)
RBC: 4.58 MIL/uL (ref 3.87–5.11)
RDW: 13.8 % (ref 11.5–15.5)
WBC: 6.4 10*3/uL (ref 4.0–10.5)

## 2014-08-09 NOTE — Progress Notes (Signed)
05-27-14 - LOV - Dr. Melvyn Novas (pulmonary)- EPIC 05-27-14 - Pulmonary Function Test - EPIC 07-17-14 - PET scan (closeCT f/u in 3 months)- EPIC 03-19-14 - 2V CXR - EPIC 10-04-13 - LOV - Dr. Oneida Alar (vasc. Surg.) - EPIC   Sees wound doctor for scraped leg - fell 9 weeks ago - Has soft cast

## 2014-08-09 NOTE — Patient Instructions (Addendum)
ACCALIA RIGDON  08/09/2014   Your procedure is scheduled on: August 15, 2014  Report to [facility]  Entrance take [facility]  elevators to 3rd floor to  [place] at 6:00 AM.  Call this number if you have problems the morning of surgery [phone]   Remember: ONLY 1 PERSON MAY GO WITH YOU TO SHORT STAY TO GET  READY MORNING OF [place].  Do not eat food or drink liquids :After Midnight.     Take these medicines the morning of surgery with A SIP OF WATER: Ativan (Lorazepam), Preservision Ared                               You may not have any metal on your body including hair pins and              piercings  Do not wear jewelry, make-up, lotions, powders or perfumes, deodorant             Do not wear nail polish.  Do not shave  48 hours prior to surgery.              Men may shave face and neck.   Do not bring valuables to the hospital. [place].  Contacts, dentures or bridgework may not be worn into surgery.  Leave suitcase in the car. After surgery it may be brought to your room.       Special Instructions: coughing and deep breathing exercises, leg exercises              Please read over the following fact sheets you were given: _____________________________________________________________________             [facility] - Preparing for Surgery Before surgery, you can play an important role.  Because skin is not sterile, your skin needs to be as free of germs as possible.  You can reduce the number of germs on your skin by washing with CHG (chlorahexidine gluconate) soap before surgery.  CHG is an antiseptic cleaner which kills germs and bonds with the skin to continue killing germs even after washing. Please DO NOT use if you have an allergy to CHG or antibacterial soaps.  If your skin becomes reddened/irritated stop using the CHG and inform your nurse when you arrive at Short Stay. Do not  shave (including legs and underarms) for at least 48 hours prior to the first CHG shower.  You may shave your face/neck. Please follow these instructions carefully:  1.  Shower with CHG Soap the night before surgery and the  morning of Surgery.  2.  If you choose to wash your hair, wash your hair first as usual with your  normal  shampoo.  3.  After you shampoo, rinse your hair and body thoroughly to remove the  shampoo.                           4.  Use CHG as you would any other liquid soap.  You can apply chg directly  to the skin and wash                       Gently  with a scrungie or clean washcloth.  5.  Apply the CHG Soap to your body ONLY FROM THE NECK DOWN.   Do not use on face/ open                           Wound or open sores. Avoid contact with eyes, ears mouth and genitals (private parts).                       Wash face,  Genitals (private parts) with your normal soap.             6.  Wash thoroughly, paying special attention to the area where your surgery  will be performed.  7.  Thoroughly rinse your body with warm water from the neck down.  8.  DO NOT shower/wash with your normal soap after using and rinsing off  the CHG Soap.                9.  Pat yourself dry with a clean towel.            10.  Wear clean pajamas.            11.  Place clean sheets on your bed the night of your first shower and do not  sleep with pets. Day of Surgery : Do not apply any lotions/deodorants the morning of surgery.  Please wear clean clothes to the hospital/surgery center.  FAILURE TO FOLLOW THESE INSTRUCTIONS MAY RESULT IN THE CANCELLATION OF YOUR SURGERY PATIENT SIGNATURE_________________________________  NURSE SIGNATURE__________________________________  ________________________________________________________________________

## 2014-08-12 ENCOUNTER — Ambulatory Visit (INDEPENDENT_AMBULATORY_CARE_PROVIDER_SITE_OTHER): Payer: Medicare Other | Admitting: Podiatry

## 2014-08-12 ENCOUNTER — Encounter: Payer: Self-pay | Admitting: Podiatry

## 2014-08-12 VITALS — BP 138/73 | HR 72 | Resp 15

## 2014-08-12 DIAGNOSIS — B351 Tinea unguium: Secondary | ICD-10-CM | POA: Diagnosis not present

## 2014-08-12 DIAGNOSIS — L84 Corns and callosities: Secondary | ICD-10-CM | POA: Diagnosis not present

## 2014-08-12 DIAGNOSIS — M79673 Pain in unspecified foot: Secondary | ICD-10-CM | POA: Diagnosis not present

## 2014-08-12 NOTE — Progress Notes (Signed)
Subjective:     Patient ID: Caroline Griffith, female   DOB: 1930/12/26, 81 y.o.   MRN: [id number]  HPI patient states this bunion on my left foot is really bothering me and I'm not able to wear my shoe gear comfortably. Also have these calluses on the bottom of both my feet   Review of Systems     Objective:   Physical Exam Neurovascular status found to be intact with good digital perfusion noted 1-5 bilateral and a red painful first metatarsal head left that is making it increasingly difficult for her to wear shoe gear with comfortably with plantar keratotic lesions underneath the first metatarsal of both feet    Assessment:     Structural HAV deformity left which is becoming increasingly more symptomatic along with lesion formation plantar aspect of both feet    Plan:     Reviewed condition at great length and x-ray the left foot. I do think we could do a moderate type procedure by removing the bone from the medial side and then moving the bone over slightly to reduce the ankle which would give her relief of the pain she is experiencing on a daily basis. Patient wants to pursue this and at this time I did allow her to read a consent form reviewing alternative treatments and complications. Patient understands the consent form and understands risk and at this time signed consent form and will be tentatively scheduled for a outpatient type procedure area she is encouraged to call with questions and today debridement was accomplished

## 2014-08-13 NOTE — Progress Notes (Signed)
Subjective:     Patient ID: Caroline Griffith, female   DOB: Feb 28, 1930, 81 y.o.   MRN: [id number]  HPI patient presents with painful lesions plantar aspects of both first metatarsals and nail disease 1-5 both feet that she cannot cut herself and are becoming painful   Review of Systems     Objective:   Physical Exam Neurovascular status unchanged at this time with thick yellow brittle nails 1-5 both feet that are painful and lesions underneath first metatarsal heads of both feet that are painful when pressed    Assessment:     Mycotic nail infection 1-5 both feet with pain and lesion formation first metatarsal both feet with pain    Plan:     Debride nailbeds 1-5 both feet and lesions on both feet with no iatrogenic bleeding noted

## 2014-08-14 ENCOUNTER — Encounter (HOSPITAL_COMMUNITY): Payer: Self-pay | Admitting: Anesthesiology

## 2014-08-14 NOTE — H&P (Signed)
  H&P  Chief Complaint: Kidney stone  History of Present Illness: Caroline Griffith is a 81 y.o. year old female who presents today for cysto/stent placement as well as ESL of a large right renal pelvic stone. HU 544, SSD 8 cm.  Both procedures will be done on the same day as well as overnight OBS due to social reasons.  Past Medical History  Diagnosis Date  . Macular degeneration   . Varicose veins   . Arthritis   . Pneumonia     hx. of  . Chronic kidney disease     kidney stones  . History of hiatal hernia     Past Surgical History  Procedure Laterality Date  . Bunionectomy Right   . Joint replacement Right     Total hip replacement  . Joint replacement Right     Total knee replacement  . Orif wrist fracture Left   . Abdominal hysterectomy    . Tonsillectomy      Home Medications:  No prescriptions prior to admission    Allergies:  Allergies  Allergen Reactions  . Sulfa Antibiotics     Unknown     Family History  Problem Relation Age of Onset  . Heart disease Mother   . Asthma Mother   . Heart disease Father   . Emphysema Mother     smoked    Social History:  reports that she quit smoking about 51 years ago. Her smoking use included Cigarettes. She has a 3.5 pack-year smoking history. She has never used smokeless tobacco. She reports that she drinks alcohol. She reports that she does not use illicit drugs.  ROS: A complete review of systems was performed.  All systems are negative except for pertinent findings as noted.  Physical Exam:  Vital signs in last 24 hours:   General:  Alert and oriented, No acute distress HEENT: Normocephalic, atraumatic Neck: No JVD or lymphadenopathy Cardiovascular: Regular rate and rhythm Lungs: Clear bilaterally Abdomen: Soft, nontender, nondistended, no abdominal masses Back: No CVA tenderness Extremities: No edema Neurologic: Grossly intact  Laboratory Data:  No results found for this or any previous visit (from the  past 24 hour(s)). No results found for this or any previous visit (from the past 240 hour(s)). Creatinine: No results for input(s): CREATININE in the last 168 hours.  Radiologic Imaging: No results found.  Impression/Assessment:   Large right renal pelvic stone  Plan:  Cysto/right J2 stent placement followed by right ESL  Jorja Loa 08/14/2014, 8:08 PM  Lillette Boxer. Keaston Pile MD

## 2014-08-14 NOTE — Anesthesia Preprocedure Evaluation (Signed)
Anesthesia Evaluation  Patient identified by MRN, date of birth, ID band Patient awake    Reviewed: Allergy & Precautions, NPO status , Patient's Chart, lab work & pertinent test results  Airway Mallampati: II  TM Distance: >3 FB Neck ROM: Full    Dental no notable dental hx.    Pulmonary shortness of breath, pneumonia -, resolved, former smoker,  breath sounds clear to auscultation  Pulmonary exam normal       Cardiovascular negative cardio ROS Normal cardiovascular examRhythm:Regular Rate:Normal     Neuro/Psych negative neurological ROS  negative psych ROS   GI/Hepatic Neg liver ROS, hiatal hernia,   Endo/Other  negative endocrine ROS  Renal/GU Renal disease  negative genitourinary   Musculoskeletal  (+) Arthritis -,   Abdominal   Peds negative pediatric ROS (+)  Hematology negative hematology ROS (+)   Anesthesia Other Findings   Reproductive/Obstetrics negative OB ROS                             Anesthesia Physical Anesthesia Plan  ASA: III  Anesthesia Plan: General   Post-op Pain Management:    Induction: Intravenous  Airway Management Planned: LMA  Additional Equipment:   Intra-op Plan:   Post-operative Plan: Extubation in OR  Informed Consent: I have reviewed the patients History and Physical, chart, labs and discussed the procedure including the risks, benefits and alternatives for the proposed anesthesia with the patient or authorized representative who has indicated his/her understanding and acceptance.   Dental advisory given  Plan Discussed with: CRNA  Anesthesia Plan Comments:         Anesthesia Quick Evaluation

## 2014-08-14 NOTE — Progress Notes (Signed)
Called Dr Diona Fanti about preop orders for 08/15/14. Patient is having surgery and then lithotripsy. Will be receiving general anesthesia and pre op antibiotic. Verbal order from Dr Diona Fanti to discontinue the levaquin, benadryl, and valium prior to ESWL.

## 2014-08-15 ENCOUNTER — Ambulatory Visit (HOSPITAL_COMMUNITY): Payer: Medicare Other | Admitting: Anesthesiology

## 2014-08-15 ENCOUNTER — Ambulatory Visit (HOSPITAL_COMMUNITY): Payer: Medicare Other

## 2014-08-15 ENCOUNTER — Encounter (HOSPITAL_COMMUNITY)
Admission: RE | Disposition: A | Discharge status: Home / Self Care | Payer: Self-pay | Source: Ambulatory Visit | Attending: Urology

## 2014-08-15 ENCOUNTER — Encounter (HOSPITAL_COMMUNITY): Payer: Self-pay | Admitting: *Deleted

## 2014-08-15 ENCOUNTER — Observation Stay (HOSPITAL_COMMUNITY)
Admission: RE | Admit: 2014-08-15 | Discharge: 2014-08-16 | Disposition: A | Discharge status: Home / Self Care | Payer: Medicare Other | Source: Ambulatory Visit | Attending: Urology | Admitting: Urology

## 2014-08-15 DIAGNOSIS — K449 Diaphragmatic hernia without obstruction or gangrene: Secondary | ICD-10-CM | POA: Diagnosis not present

## 2014-08-15 DIAGNOSIS — H3530 Unspecified macular degeneration: Secondary | ICD-10-CM | POA: Insufficient documentation

## 2014-08-15 DIAGNOSIS — Z87891 Personal history of nicotine dependence: Secondary | ICD-10-CM | POA: Diagnosis not present

## 2014-08-15 DIAGNOSIS — N200 Calculus of kidney: Principal | ICD-10-CM

## 2014-08-15 DIAGNOSIS — M1990 Unspecified osteoarthritis, unspecified site: Secondary | ICD-10-CM | POA: Diagnosis not present

## 2014-08-15 DIAGNOSIS — Z96651 Presence of right artificial knee joint: Secondary | ICD-10-CM | POA: Insufficient documentation

## 2014-08-15 DIAGNOSIS — Z96641 Presence of right artificial hip joint: Secondary | ICD-10-CM | POA: Insufficient documentation

## 2014-08-15 HISTORY — PX: CYSTOSCOPY WITH STENT PLACEMENT: SHX5790

## 2014-08-15 SURGERY — CYSTOSCOPY, WITH STENT INSERTION
Anesthesia: General | Laterality: Right

## 2014-08-15 SURGERY — LITHOTRIPSY, ESWL
Anesthesia: LOCAL | Laterality: Right

## 2014-08-15 MED ORDER — OXYBUTYNIN CHLORIDE 5 MG PO TABS: 5.0000 mg | ORAL_TABLET | Freq: Three times a day (TID) | ORAL | Status: DC | PRN

## 2014-08-15 MED ORDER — HYDROCODONE-ACETAMINOPHEN 5-325 MG PO TABS
1.0000 | ORAL_TABLET | ORAL | Status: DC | PRN
  Administered 2014-08-15 (×3): 2 via ORAL
  Filled 2014-08-15 (×3): qty 2

## 2014-08-15 MED ORDER — ACETAMINOPHEN 10 MG/ML IV SOLN
1000.0000 mg | Freq: Once | INTRAVENOUS | Status: AC
  Administered 2014-08-15: 1000 mg via INTRAVENOUS
  Filled 2014-08-15: qty 100

## 2014-08-15 MED ORDER — TRAMADOL HCL 50 MG PO TABS: 50.0000 mg | ORAL_TABLET | Freq: Four times a day (QID) | ORAL | Status: DC | PRN

## 2014-08-15 MED ORDER — FENTANYL CITRATE (PF) 250 MCG/5ML IJ SOLN
INTRAMUSCULAR | Status: AC
  Filled 2014-08-15: qty 25

## 2014-08-15 MED ORDER — SODIUM CHLORIDE 0.9 % IR SOLN
Status: DC | PRN
  Administered 2014-08-15: 3000 mL via INTRAVESICAL

## 2014-08-15 MED ORDER — SODIUM CHLORIDE 0.9 % IV SOLN
INTRAVENOUS | Status: DC
Start: 2014-08-15 — End: 2014-08-15
  Administered 2014-08-15: 09:00:00 via INTRAVENOUS

## 2014-08-15 MED ORDER — CEFAZOLIN SODIUM-DEXTROSE 2-3 GM-% IV SOLR
INTRAVENOUS | Status: AC
  Filled 2014-08-15: qty 50

## 2014-08-15 MED ORDER — PROPOFOL 10 MG/ML IV BOLUS
INTRAVENOUS | Status: AC
Start: 2014-08-15 — End: 2014-08-15
  Filled 2014-08-15: qty 20

## 2014-08-15 MED ORDER — LIDOCAINE HCL (CARDIAC) 20 MG/ML IV SOLN
INTRAVENOUS | Status: AC
  Filled 2014-08-15: qty 5

## 2014-08-15 MED ORDER — LACTATED RINGERS IV SOLN
INTRAVENOUS | Status: DC | PRN
  Administered 2014-08-15: 08:00:00 via INTRAVENOUS

## 2014-08-15 MED ORDER — ONDANSETRON HCL 4 MG/2ML IJ SOLN: 4.0000 mg | Freq: Four times a day (QID) | INTRAMUSCULAR | Status: DC | PRN

## 2014-08-15 MED ORDER — ACETAMINOPHEN 500 MG PO TABS: 1000.0000 mg | ORAL_TABLET | Freq: Four times a day (QID) | ORAL | Status: DC | PRN

## 2014-08-15 MED ORDER — ONDANSETRON HCL 4 MG/2ML IJ SOLN
INTRAMUSCULAR | Status: AC
  Filled 2014-08-15: qty 2

## 2014-08-15 MED ORDER — CEFAZOLIN SODIUM-DEXTROSE 2-3 GM-% IV SOLR
2.0000 g | INTRAVENOUS | Status: AC
  Administered 2014-08-15: 2 g via INTRAVENOUS

## 2014-08-15 MED ORDER — MORPHINE SULFATE 2 MG/ML IJ SOLN: 2.0000 mg | INTRAMUSCULAR | Status: DC | PRN

## 2014-08-15 MED ORDER — ACETAMINOPHEN 325 MG PO TABS: 650.0000 mg | ORAL_TABLET | ORAL | Status: DC | PRN

## 2014-08-15 MED ORDER — DOCUSATE SODIUM 100 MG PO CAPS
100.0000 mg | ORAL_CAPSULE | Freq: Two times a day (BID) | ORAL | Status: DC
  Administered 2014-08-15: 100 mg via ORAL
  Filled 2014-08-15: qty 1

## 2014-08-15 MED ORDER — MIDAZOLAM HCL 2 MG/2ML IJ SOLN
INTRAMUSCULAR | Status: DC | PRN
  Administered 2014-08-15: 1 mg via INTRAVENOUS

## 2014-08-15 MED ORDER — IOHEXOL 300 MG/ML  SOLN
INTRAMUSCULAR | Status: DC | PRN
  Administered 2014-08-15: 8 mL via URETHRAL

## 2014-08-15 MED ORDER — CEPHALEXIN 500 MG PO CAPS
500.0000 mg | ORAL_CAPSULE | Freq: Two times a day (BID) | ORAL | Status: DC
  Administered 2014-08-15: 500 mg via ORAL
  Filled 2014-08-15: qty 1

## 2014-08-15 MED ORDER — ONDANSETRON HCL 4 MG/2ML IJ SOLN
INTRAMUSCULAR | Status: DC | PRN
  Administered 2014-08-15: 4 mg via INTRAVENOUS

## 2014-08-15 MED ORDER — ROCURONIUM BROMIDE 100 MG/10ML IV SOLN
INTRAVENOUS | Status: AC
  Filled 2014-08-15: qty 1

## 2014-08-15 MED ORDER — ONDANSETRON HCL 4 MG/2ML IJ SOLN: 4.0000 mg | INTRAMUSCULAR | Status: DC | PRN

## 2014-08-15 MED ORDER — PROPOFOL 10 MG/ML IV BOLUS
INTRAVENOUS | Status: DC | PRN
  Administered 2014-08-15: 150 mg via INTRAVENOUS

## 2014-08-15 MED ORDER — PROPOFOL 10 MG/ML IV BOLUS
INTRAVENOUS | Status: AC
  Filled 2014-08-15: qty 20

## 2014-08-15 MED ORDER — FENTANYL CITRATE (PF) 250 MCG/5ML IJ SOLN
INTRAMUSCULAR | Status: DC | PRN
  Administered 2014-08-15: 50 ug via INTRAVENOUS

## 2014-08-15 MED ORDER — POTASSIUM CHLORIDE 2 MEQ/ML IV SOLN
INTRAVENOUS | Status: DC
  Administered 2014-08-15 – 2014-08-16 (×2): via INTRAVENOUS
  Filled 2014-08-15 (×3): qty 1000

## 2014-08-15 MED ORDER — PROMETHAZINE HCL 25 MG/ML IJ SOLN: 6.2500 mg | INTRAMUSCULAR | Status: DC | PRN

## 2014-08-15 MED ORDER — DEXTROSE-NACL 5-0.45 % IV SOLN: INTRAVENOUS | Status: DC

## 2014-08-15 MED ORDER — DEXAMETHASONE SODIUM PHOSPHATE 10 MG/ML IJ SOLN
INTRAMUSCULAR | Status: AC
  Filled 2014-08-15: qty 1

## 2014-08-15 MED ORDER — LORAZEPAM 0.5 MG PO TABS: 0.5000 mg | ORAL_TABLET | Freq: Two times a day (BID) | ORAL | Status: DC | PRN

## 2014-08-15 MED ORDER — PHENYLEPHRINE 40 MCG/ML (10ML) SYRINGE FOR IV PUSH (FOR BLOOD PRESSURE SUPPORT)
PREFILLED_SYRINGE | INTRAVENOUS | Status: AC
  Filled 2014-08-15: qty 10

## 2014-08-15 MED ORDER — CEPHALEXIN 500 MG PO CAPS: 500.0000 mg | ORAL_CAPSULE | Freq: Every day | ORAL | Status: DC

## 2014-08-15 MED ORDER — EPHEDRINE SULFATE 50 MG/ML IJ SOLN
INTRAMUSCULAR | Status: AC
  Filled 2014-08-15: qty 1

## 2014-08-15 MED ORDER — MIDAZOLAM HCL 2 MG/2ML IJ SOLN
INTRAMUSCULAR | Status: AC
  Filled 2014-08-15: qty 4

## 2014-08-15 MED ORDER — LIDOCAINE HCL (CARDIAC) 20 MG/ML IV SOLN
INTRAVENOUS | Status: DC | PRN
  Administered 2014-08-15: 50 mg via INTRAVENOUS

## 2014-08-15 MED ORDER — FENTANYL CITRATE (PF) 100 MCG/2ML IJ SOLN: 25.0000 ug | INTRAMUSCULAR | Status: DC | PRN

## 2014-08-15 MED ORDER — TAMSULOSIN HCL 0.4 MG PO CAPS: 0.4000 mg | ORAL_CAPSULE | Freq: Every day | ORAL | Status: DC

## 2014-08-15 SURGICAL SUPPLY — 16 items
BAG URO CATCHER STRL LF (DRAPE) ×3 IMPLANT
CATH INTERMIT  6FR 70CM (CATHETERS) ×2 IMPLANT
CATH URET 5FR 28IN CONE TIP (BALLOONS)
CATH URET 5FR 70CM CONE TIP (BALLOONS) ×1 IMPLANT
CLOTH BEACON ORANGE TIMEOUT ST (SAFETY) ×3 IMPLANT
GLOVE BIOGEL M 8.0 STRL (GLOVE) ×7 IMPLANT
GOWN STRL REUS W/ TWL XL LVL3 (GOWN DISPOSABLE) ×1 IMPLANT
GOWN STRL REUS W/TWL XL LVL3 (GOWN DISPOSABLE) ×6 IMPLANT
GUIDEWIRE STR DUAL SENSOR (WIRE) ×3 IMPLANT
MANIFOLD NEPTUNE II (INSTRUMENTS) ×3 IMPLANT
PACK CYSTO (CUSTOM PROCEDURE TRAY) ×3 IMPLANT
SHEATH ACCESS URETERAL 38CM (SHEATH) ×1 IMPLANT
STENT URET 6FRX24 CONTOUR (STENTS) ×2 IMPLANT
TUBING CONNECTING 10 (TUBING) ×2 IMPLANT
TUBING CONNECTING 10' (TUBING) ×1
WIRE COONS/BENSON .038X145CM (WIRE) ×1 IMPLANT

## 2014-08-15 NOTE — Progress Notes (Signed)
Patient in short stay for an hour post lithotripsy. VSS. Tolerated po fluids and crackers. Report given to RN at bedside when patient transferred to 1422 for overnight stay.

## 2014-08-15 NOTE — Progress Notes (Signed)
Patient going from PACU to lithotripsy truck at 0905. Report received from [facility] in PACU and patient taken via wheelchair to lithotrispy truck and report given to [place], Therapist, sports. Patient alert and oriented. VSS.

## 2014-08-15 NOTE — Transfer of Care (Signed)
Immediate Anesthesia Transfer of Care Note  Patient: Caroline Griffith  Procedure(s) Performed: Procedure(s): CYSTOSCOPY WITH RIGHT STENT PLACEMENT (Right)  Patient Location: PACU  Anesthesia Type:General  Level of Consciousness: alert , oriented and patient cooperative  Airway & Oxygen Therapy: Patient Spontanous Breathing and Patient connected to face mask oxygen  Post-op Assessment: Report given to RN, Post -op Vital signs reviewed and stable and Patient moving all extremities  Post vital signs: stable  Last Vitals:  Filed Vitals:   08/15/14 0601  BP: 132/81  Pulse: 91  Temp: 36.6 C  Resp: 16    Complications: No apparent anesthesia complications

## 2014-08-15 NOTE — Anesthesia Procedure Notes (Signed)
Procedure Name: LMA Insertion Date/Time: 08/15/2014 8:30 AM Performed by: Pilar Grammes Pre-anesthesia Checklist: Patient identified, Emergency Drugs available, Suction available, Patient being monitored and Timeout performed Patient Re-evaluated:Patient Re-evaluated prior to inductionOxygen Delivery Method: Circle system utilized Preoxygenation: Pre-oxygenation with 100% oxygen Intubation Type: IV induction Ventilation: Mask ventilation without difficulty LMA Size: 4.0

## 2014-08-15 NOTE — Interval H&P Note (Signed)
History and Physical Interval Note: Toleralated right ureteral stent placement this AM by Dr. Diona Fanti.  No additional changes to her H &P.  08/15/2014 9:19 AM  Caroline Griffith  has presented today for surgery, with the diagnosis of RIGHT RENAL STONE  The various methods of treatment have been discussed with the patient and family. After consideration of risks, benefits and other options for treatment, the patient has consented to  Procedure(s): RIGHT EXTRACORPOREAL SHOCK WAVE LITHOTRIPSY (ESWL) (Right) as a surgical intervention .  The patient's history has been reviewed, patient examined, no change in status, stable for surgery.  I have reviewed the patient's chart and labs.  Questions were answered to the patient's satisfaction.     Louis Meckel W

## 2014-08-15 NOTE — Discharge Instructions (Signed)
See [facility] discharge instructions in chart.  DISCHARGE INSTRUCTIONS FOR KIDNEY STONE/URETERAL STENT   MEDICATIONS:  1.  Resume all your other meds from home - except do not take any extra narcotic pain meds that you may have at home.  2. Tramadol is for moderate to severe pain. 3. Tamsulosin is to help you pass your stone fragments.   ACTIVITY:  1. No strenuous activity x 1week  2. No driving while on narcotic pain medications  3. Drink plenty of water  4. Continue to walk at home - you can still get blood clots when you are at home, so keep active, but don't over do it.  5. May return to work/school tomorrow or when you feel ready   BATHING:  1. You can shower and we recommend daily showers     SIGNS/SYMPTOMS TO CALL:  Please call us if you have a fever greater than 101.5, uncontrolled nausea/vomiting, uncontrolled pain, dizziness, unable to urinate, bloody urine, chest pain, shortness of breath, leg swelling, leg pain, redness around wound, drainage from wound, or any other concerns or questions.   You can reach [place] at [phone].   FOLLOW-UP:  1. You have been scheduled to follow-up with Dr. Diona Fanti for removal of your stent.

## 2014-08-15 NOTE — Anesthesia Postprocedure Evaluation (Signed)
  Anesthesia Post-op Note  Patient: Caroline Griffith  Procedure(s) Performed: Procedure(s) (LRB): CYSTOSCOPY WITH RIGHT STENT PLACEMENT (Right)  Patient Location: PACU  Anesthesia Type: General  Level of Consciousness: awake and alert   Airway and Oxygen Therapy: Patient Spontanous Breathing  Post-op Pain: mild  Post-op Assessment: Post-op Vital signs reviewed, Patient's Cardiovascular Status Stable, Respiratory Function Stable, Patent Airway and No signs of Nausea or vomiting  Last Vitals:  Filed Vitals:   08/15/14 0853  BP: 108/56  Pulse: 59  Temp: 36.3 C  Resp: 13    Post-op Vital Signs: stable   Complications: No apparent anesthesia complications

## 2014-08-15 NOTE — Op Note (Signed)
Preope large right renal pelvic stone rative diagnosis: Large right renal pelvic stone  Postoperative diagnosis: Same   Procedure: Cystoscopy, right retrograde ureteropyelogram with interpretive fluoroscopy, placement of 6 x 24 cm contour stent in right ureter without tether    Surgeon: Lillette Boxer. Zeniah Briney, M.D.   Anesthesia: Gen.   Complications: None  Specimen(s): None  Drain(s): Previously mentioned stent  Indications: 81 year old female with a large right renal pelvic stone. Although this is pain-free, it is significantly large and due to the size, and its location, it was recommended that she have intervention. We talked about lithotripsy with shockwave versus percutaneous nephrolithotomy. This stone has a relatively low Hounsfield unit measurement, and I recommended, for at least the first stage, shockwave lithotripsy. She presents at this time for stent placement to assist for gravel passage.   Technique and findings: The patient was identified in the holding area. She had her surgical side properly marked. She received IV anabiotic's. She was taken to the operating room where general and aesthetic was administered with LMA. She was placed in the dorsolithotomy position. Genitalia and perineum were prepped and draped. Proper timeout was performed.  A 23 French panendoscope was advanced in her bladder. Inspection of the bladder revealed normal urothelium. No tumors or foreign bodies were noted. Ureteral orifices were normal in configuration and location.  The right ureter was cannulated with a 6 [place] open-ended catheter. Omnipaque was used for a retrograde ureteropyelogram. This revealed a normal ureter, without evidence of filling defect or stricture. No hydronephrosis was present. A filling defect was present in the renal pelvis consistent with the previously mentioned stone. Calyces were normal.  At this point, a 0.038 inch sensor-tip guidewire was advanced through the open-ended  catheter into the upper pole calyces. The open-ended catheter was removed, leaving the guidewire in place. A 24 cm x 6 French contour double-J stent was then advanced over the guidewire and properly positioned using fluoroscopic and cystoscopic guidance. Once a guidewire was removed adequate curls were seen proximally and distally.  The bladder was drained. The scope was then removed. She tolerated procedure well. She was awakened and taken to the PACU in stable condition.  The patient will receive her shockwave lithotripsy later in the day. I have communicated with Dr. Louis Meckel who will be performing this procedure.

## 2014-08-16 ENCOUNTER — Encounter (HOSPITAL_COMMUNITY): Payer: Self-pay | Admitting: Urology

## 2014-08-16 ENCOUNTER — Encounter (HOSPITAL_BASED_OUTPATIENT_CLINIC_OR_DEPARTMENT_OTHER): Payer: Medicare Other | Attending: Internal Medicine

## 2014-08-16 DIAGNOSIS — N200 Calculus of kidney: Secondary | ICD-10-CM | POA: Diagnosis not present

## 2014-08-16 DIAGNOSIS — L97221 Non-pressure chronic ulcer of left calf limited to breakdown of skin: Secondary | ICD-10-CM | POA: Insufficient documentation

## 2014-08-16 DIAGNOSIS — I83222 Varicose veins of left lower extremity with both ulcer of calf and inflammation: Secondary | ICD-10-CM | POA: Insufficient documentation

## 2014-08-16 MED ORDER — CEPHALEXIN 500 MG PO CAPS: 500.0000 mg | ORAL_CAPSULE | Freq: Two times a day (BID) | ORAL | Status: DC

## 2014-08-16 NOTE — Discharge Summary (Signed)
Patient ID: CATHELEEN LANGHORNE MRN: [id number] DOB/AGE: 1930/02/24 81 y.o.  Admit date: 08/15/2014 Discharge date: 08/16/2014  Primary Care Physician:  Irven Shelling, MD  Discharge Diagnoses:   Present on Admission:  **None** Renal calculus Consults:    None   Discharge Medications:   Medication List    TAKE these medications        tamsulosin 0.4 MG Caps capsule  Commonly known as:  FLOMAX  Take 1 capsule (0.4 mg total) by mouth daily.     traMADol 50 MG tablet  Commonly known as:  ULTRAM  Take 1-2 tablets (50-100 mg total) by mouth every 6 (six) hours as needed for moderate pain.      ASK your doctor about these medications        CALTRATE 600+D 600-400 MG-UNIT per tablet  Generic drug:  Calcium Carbonate-Vitamin D  Take 1 tablet by mouth daily.     cephALEXin 250 MG capsule  Commonly known as:  KEFLEX  TAKE ONE CAPSULE BY MOUTH AT BEDTIME TAKE AFTER CIPRO IS FINISHED     LORazepam 0.5 MG tablet  Commonly known as:  ATIVAN  Take 0.5 mg by mouth 2 (two) times daily as needed for anxiety.     naproxen sodium 220 MG tablet  Commonly known as:  ANAPROX  Take 220 mg by mouth 2 (two) times daily as needed (pain).     oxybutynin 3.9 MG/24HR  Commonly known as:  OXYTROL  Place 1 patch onto the skin once a week.     PRESERVISION AREDS 2 Caps  Take 1 capsule by mouth 2 (two) times daily.         Significant Diagnostic Studies:  No results found.  Brief H and P: For complete details please refer to admission H and P, but in brief the pt was admitted for OBS after stent placement followed by ESL of a large renal stone  Hospital Course:  Active Problems:   Stone, kidney   Right nephrolithiasis   Day of Discharge BP 100/56 mmHg  Pulse 65  Temp(Src) 98.1 F (36.7 C) (Oral)  Resp 18  Ht 5\' 6"  (1.676 m)  Wt 68.947 kg (152 lb)  BMI 24.55 kg/m2  SpO2 98%  No results found for this or any previous visit (from the past 24 hour(s)).  Physical  Exam: General: Alert and awake oriented x3 not in any acute distress. HEENT: anicteric sclera, pupils reactive to light and accommodation CVS: S1-S2 clear no murmur rubs or gallops Chest: clear to auscultation bilaterally, no wheezing rales or rhonchi Abdomen: soft nontender, nondistended, normal bowel sounds, no organomegaly Extremities: no cyanosis, clubbing or edema noted bilaterally Neuro: Cranial nerves II-XII intact, no focal neurological deficits  Disposition:  Home  Diet:  Regular  Activity:  No restrictions   Disposition and Follow-up:    F/u as scheduled  TESTS THAT NEED FOLLOW-UP  followup KUB in office  DISCHARGE FOLLOW-UP     Follow-up Information    Follow up with Jorja Loa, MD On 08/28/2014.   Specialty:  Urology   Why:  10am   Contact information:   [address] [place] [address] [phone]       Time spent on Discharge:  10 mins  Signed: Jorja Loa 08/16/2014, 6:14 AM

## 2014-08-18 HISTORY — PX: EXTRACORPOREAL SHOCK WAVE LITHOTRIPSY: SHX1557

## 2014-08-20 DIAGNOSIS — I83222 Varicose veins of left lower extremity with both ulcer of calf and inflammation: Secondary | ICD-10-CM | POA: Diagnosis not present

## 2014-08-20 DIAGNOSIS — L97221 Non-pressure chronic ulcer of left calf limited to breakdown of skin: Secondary | ICD-10-CM | POA: Diagnosis present

## 2014-08-29 DIAGNOSIS — L97221 Non-pressure chronic ulcer of left calf limited to breakdown of skin: Secondary | ICD-10-CM | POA: Diagnosis not present

## 2014-09-30 ENCOUNTER — Encounter: Payer: Self-pay | Admitting: Podiatry

## 2014-09-30 ENCOUNTER — Ambulatory Visit (INDEPENDENT_AMBULATORY_CARE_PROVIDER_SITE_OTHER): Payer: Medicare Other | Admitting: Podiatry

## 2014-09-30 DIAGNOSIS — L84 Corns and callosities: Secondary | ICD-10-CM

## 2014-09-30 NOTE — Progress Notes (Signed)
Subjective:     Patient ID: Caroline Griffith, female   DOB: [birth year], 81 y.o.   MRN: [id number]  HPI patient presents for debridement bilateral   Review of Systems     Objective:   Physical Exam Neurovascular status intact with keratotic lesion sub-first metatarsal bilateral    Assessment:     Callus formation bilateral    Plan:     Debride callus bilateral

## 2014-11-11 ENCOUNTER — Encounter: Payer: Self-pay | Admitting: Podiatry

## 2014-11-11 ENCOUNTER — Ambulatory Visit (INDEPENDENT_AMBULATORY_CARE_PROVIDER_SITE_OTHER): Payer: Medicare Other | Admitting: Podiatry

## 2014-11-11 DIAGNOSIS — L84 Corns and callosities: Secondary | ICD-10-CM | POA: Diagnosis not present

## 2014-11-13 NOTE — Progress Notes (Signed)
Subjective:     Patient ID: Caroline Griffith, female   DOB: 10-15-1930, 81 y.o.   MRN: [id number]  HPI patient presents with plantar calluses bilateral that get sore   Review of Systems     Objective:   Physical Exam  neurovascular status intact with keratotic lesion sub-first metatarsal bilateral    Assessment:      lesion secondary to bone pressure    Plan:      debride lesions and advised on padding therapy and reappoint as needed

## 2014-12-23 ENCOUNTER — Encounter: Payer: Self-pay | Admitting: Podiatry

## 2014-12-23 ENCOUNTER — Ambulatory Visit (INDEPENDENT_AMBULATORY_CARE_PROVIDER_SITE_OTHER): Payer: Medicare Other | Admitting: Podiatry

## 2014-12-23 VITALS — BP 102/56 | HR 82 | Resp 16

## 2014-12-23 DIAGNOSIS — L84 Corns and callosities: Secondary | ICD-10-CM

## 2014-12-23 DIAGNOSIS — B351 Tinea unguium: Secondary | ICD-10-CM

## 2014-12-23 DIAGNOSIS — M79673 Pain in unspecified foot: Secondary | ICD-10-CM

## 2014-12-24 NOTE — Progress Notes (Signed)
Subjective:     Patient ID: Caroline Griffith, female   DOB: Jan 15, 1930, 81 y.o.   MRN: [id number]  HPI patient presents lesions plantar aspect first metatarsal bilateral that are bothering her again   Review of Systems     Objective:   Physical Exam Neurovascular status intact keratotic lesions first metatarsal bilateral    Assessment:     Chronic lesion formation with pain    Plan:     Debride lesions with no iatrogenic bleeding and reappoint to recheck

## 2015-01-14 DIAGNOSIS — S60211A Contusion of right wrist, initial encounter: Secondary | ICD-10-CM | POA: Diagnosis not present

## 2015-02-03 ENCOUNTER — Encounter: Payer: Self-pay | Admitting: Podiatry

## 2015-02-03 ENCOUNTER — Ambulatory Visit (INDEPENDENT_AMBULATORY_CARE_PROVIDER_SITE_OTHER): Payer: PPO | Admitting: Sports Medicine

## 2015-02-03 VITALS — BP 144/77 | HR 88 | Resp 16

## 2015-02-03 DIAGNOSIS — L909 Atrophic disorder of skin, unspecified: Secondary | ICD-10-CM

## 2015-02-03 DIAGNOSIS — L84 Corns and callosities: Secondary | ICD-10-CM | POA: Diagnosis not present

## 2015-02-03 DIAGNOSIS — M21619 Bunion of unspecified foot: Secondary | ICD-10-CM

## 2015-02-03 DIAGNOSIS — R29898 Other symptoms and signs involving the musculoskeletal system: Secondary | ICD-10-CM

## 2015-02-03 DIAGNOSIS — I739 Peripheral vascular disease, unspecified: Secondary | ICD-10-CM

## 2015-02-03 NOTE — Progress Notes (Signed)
Patient ID: Caroline Griffith, female   DOB: 07/26/1930, 82 y.o.   MRN: [id number] Subjective: Caroline Griffith is a 82 y.o. female patient who presents to office for evaluation of bilateral foot pain. Patient complains of pain at the callus sites that are best after trimming. Denies Diabetes or Neuropathy. Patient denies any other pedal complaints.   Patient Active Problem List   Diagnosis Date Noted  . Stone, kidney 08/15/2014  . Right nephrolithiasis 08/15/2014  . Pulmonary nodules 03/20/2014  . Dyspnea 03/19/2014   Current Outpatient Prescriptions on File Prior to Visit  Medication Sig Dispense Refill  . Calcium Carbonate-Vitamin D (CALTRATE 600+D) 600-400 MG-UNIT per tablet Take 1 tablet by mouth daily.     . LORazepam (ATIVAN) 0.5 MG tablet Take 0.5 mg by mouth 2 (two) times daily as needed for anxiety.    . Multiple Vitamins-Minerals (PRESERVISION AREDS 2) CAPS Take 1 capsule by mouth 2 (two) times daily.    . naproxen sodium (ANAPROX) 220 MG tablet Take 220 mg by mouth 2 (two) times daily as needed (pain).     . oxybutynin (OXYTROL) 3.9 MG/24HR Place 1 patch onto the skin once a week.     . tamsulosin (FLOMAX) 0.4 MG CAPS capsule Take 1 capsule (0.4 mg total) by mouth daily. 14 capsule 0  . traMADol (ULTRAM) 50 MG tablet Take 1-2 tablets (50-100 mg total) by mouth every 6 (six) hours as needed for moderate pain. 30 tablet 0   No current facility-administered medications on file prior to visit.   Allergies  Allergen Reactions  . Sulfa Antibiotics     Unknown      Objective:  General: Alert and oriented x3 in no acute distress  Dermatology: Keratotic lesions present sub met 1 bilateral consistent with callus with no signs of infection or underlying opening, no webspace macerations, no ecchymosis bilateral, all nails x 10 are well manicured.  Vascular: Dorsalis Pedis and Posterior Tibial pedal pulses 1/4 faint, Capillary Fill Time 4 seconds, scant pedal hair growth bilateral, trace  edema bilateral lower extremities,  + varicosities bilateral, Temperature gradient within normal limits.  Neurology: Gross sensation intact via light touch bilateral.  Musculoskeletal: Mild tenderness with palpation at the keratotic lesion sites on Right>Left, Muscular strength 5/5 in all groups without pain on range of motion.+ bunion and fat pad atrophy deformity noted.  Assessment and Plan: Problem List Items Addressed This Visit    None    Visit Diagnoses    Callus of foot    -  Primary    Bunion        Fat pad atrophy of foot        PVD (peripheral vascular disease) (HCC)          -Complete examination performed -Discussed treatement options -Parred keratoic lesions x2 using a chisel blade; treated the area with Salinocaine covered with moleskin; keep intact for 1 day -Recommend good supportive shoes for foot type -Recommend daily skin emollients -Recommend elevation of legs to assist with edema control or use compression stockings -Patient to return to office in 6 weeks/ as needed or sooner if condition worsens.   , DPM  

## 2015-02-18 DIAGNOSIS — R269 Unspecified abnormalities of gait and mobility: Secondary | ICD-10-CM | POA: Diagnosis not present

## 2015-02-18 DIAGNOSIS — K529 Noninfective gastroenteritis and colitis, unspecified: Secondary | ICD-10-CM | POA: Diagnosis not present

## 2015-02-18 DIAGNOSIS — R42 Dizziness and giddiness: Secondary | ICD-10-CM | POA: Diagnosis not present

## 2015-02-20 DIAGNOSIS — H353211 Exudative age-related macular degeneration, right eye, with active choroidal neovascularization: Secondary | ICD-10-CM | POA: Diagnosis not present

## 2015-02-25 DIAGNOSIS — H353222 Exudative age-related macular degeneration, left eye, with inactive choroidal neovascularization: Secondary | ICD-10-CM | POA: Diagnosis not present

## 2015-02-28 DIAGNOSIS — L84 Corns and callosities: Secondary | ICD-10-CM

## 2015-03-14 DIAGNOSIS — M79671 Pain in right foot: Secondary | ICD-10-CM | POA: Diagnosis not present

## 2015-03-14 DIAGNOSIS — M2011 Hallux valgus (acquired), right foot: Secondary | ICD-10-CM | POA: Diagnosis not present

## 2015-03-17 ENCOUNTER — Ambulatory Visit: Payer: PPO | Admitting: Sports Medicine

## 2015-04-04 ENCOUNTER — Encounter (HOSPITAL_BASED_OUTPATIENT_CLINIC_OR_DEPARTMENT_OTHER): Payer: PPO | Attending: Internal Medicine

## 2015-04-04 DIAGNOSIS — M1990 Unspecified osteoarthritis, unspecified site: Secondary | ICD-10-CM | POA: Insufficient documentation

## 2015-04-04 DIAGNOSIS — I87311 Chronic venous hypertension (idiopathic) with ulcer of right lower extremity: Secondary | ICD-10-CM | POA: Diagnosis not present

## 2015-04-04 DIAGNOSIS — L97811 Non-pressure chronic ulcer of other part of right lower leg limited to breakdown of skin: Secondary | ICD-10-CM | POA: Diagnosis not present

## 2015-04-04 DIAGNOSIS — I739 Peripheral vascular disease, unspecified: Secondary | ICD-10-CM | POA: Diagnosis not present

## 2015-04-04 DIAGNOSIS — I87331 Chronic venous hypertension (idiopathic) with ulcer and inflammation of right lower extremity: Secondary | ICD-10-CM | POA: Insufficient documentation

## 2015-04-11 DIAGNOSIS — L97811 Non-pressure chronic ulcer of other part of right lower leg limited to breakdown of skin: Secondary | ICD-10-CM | POA: Diagnosis not present

## 2015-04-11 DIAGNOSIS — I87311 Chronic venous hypertension (idiopathic) with ulcer of right lower extremity: Secondary | ICD-10-CM | POA: Diagnosis not present

## 2015-04-11 DIAGNOSIS — I87331 Chronic venous hypertension (idiopathic) with ulcer and inflammation of right lower extremity: Secondary | ICD-10-CM | POA: Diagnosis not present

## 2015-04-18 ENCOUNTER — Encounter (HOSPITAL_BASED_OUTPATIENT_CLINIC_OR_DEPARTMENT_OTHER): Payer: PPO | Attending: Internal Medicine

## 2015-04-18 DIAGNOSIS — I87311 Chronic venous hypertension (idiopathic) with ulcer of right lower extremity: Secondary | ICD-10-CM | POA: Diagnosis not present

## 2015-04-18 DIAGNOSIS — L97811 Non-pressure chronic ulcer of other part of right lower leg limited to breakdown of skin: Secondary | ICD-10-CM | POA: Insufficient documentation

## 2015-04-18 DIAGNOSIS — I87331 Chronic venous hypertension (idiopathic) with ulcer and inflammation of right lower extremity: Secondary | ICD-10-CM | POA: Insufficient documentation

## 2015-04-18 DIAGNOSIS — M1990 Unspecified osteoarthritis, unspecified site: Secondary | ICD-10-CM | POA: Insufficient documentation

## 2015-04-21 ENCOUNTER — Ambulatory Visit
Admission: RE | Admit: 2015-04-21 | Discharge: 2015-04-21 | Disposition: A | Discharge status: Home / Self Care | Payer: PPO | Source: Ambulatory Visit | Attending: Internal Medicine | Admitting: Internal Medicine

## 2015-04-21 ENCOUNTER — Other Ambulatory Visit: Payer: Self-pay | Admitting: Internal Medicine

## 2015-04-21 DIAGNOSIS — M47816 Spondylosis without myelopathy or radiculopathy, lumbar region: Secondary | ICD-10-CM

## 2015-04-21 DIAGNOSIS — M545 Low back pain: Secondary | ICD-10-CM | POA: Diagnosis not present

## 2015-04-21 DIAGNOSIS — R269 Unspecified abnormalities of gait and mobility: Secondary | ICD-10-CM | POA: Diagnosis not present

## 2015-04-21 DIAGNOSIS — R0789 Other chest pain: Secondary | ICD-10-CM | POA: Diagnosis not present

## 2015-04-22 DIAGNOSIS — H353211 Exudative age-related macular degeneration, right eye, with active choroidal neovascularization: Secondary | ICD-10-CM | POA: Diagnosis not present

## 2015-04-25 DIAGNOSIS — I87331 Chronic venous hypertension (idiopathic) with ulcer and inflammation of right lower extremity: Secondary | ICD-10-CM | POA: Diagnosis not present

## 2015-04-25 DIAGNOSIS — L97811 Non-pressure chronic ulcer of other part of right lower leg limited to breakdown of skin: Secondary | ICD-10-CM | POA: Diagnosis not present

## 2015-04-25 DIAGNOSIS — I87311 Chronic venous hypertension (idiopathic) with ulcer of right lower extremity: Secondary | ICD-10-CM | POA: Diagnosis not present

## 2015-05-06 DIAGNOSIS — H353222 Exudative age-related macular degeneration, left eye, with inactive choroidal neovascularization: Secondary | ICD-10-CM | POA: Diagnosis not present

## 2015-05-14 DIAGNOSIS — L821 Other seborrheic keratosis: Secondary | ICD-10-CM | POA: Diagnosis not present

## 2015-05-14 DIAGNOSIS — L570 Actinic keratosis: Secondary | ICD-10-CM | POA: Diagnosis not present

## 2015-05-14 DIAGNOSIS — Z85828 Personal history of other malignant neoplasm of skin: Secondary | ICD-10-CM | POA: Diagnosis not present

## 2015-06-02 ENCOUNTER — Ambulatory Visit: Payer: PPO | Attending: Internal Medicine | Admitting: Physical Therapy

## 2015-06-02 ENCOUNTER — Ambulatory Visit: Payer: PPO | Admitting: Physical Therapy

## 2015-06-02 DIAGNOSIS — R262 Difficulty in walking, not elsewhere classified: Secondary | ICD-10-CM | POA: Insufficient documentation

## 2015-06-02 DIAGNOSIS — R296 Repeated falls: Secondary | ICD-10-CM | POA: Diagnosis not present

## 2015-06-02 DIAGNOSIS — M6281 Muscle weakness (generalized): Secondary | ICD-10-CM | POA: Diagnosis not present

## 2015-06-02 NOTE — Therapy (Signed)
[name], [place], [address] Phone: [phone]   Fax:  [phone]  Physical Therapy Evaluation  Patient Details  Name: Caroline Griffith MRN: [id number] Date of Birth: [birth year] Referring Provider: Dr Lavone Orn  Encounter Date: 06/02/2015      PT End of Session - 06/02/15 1326    Visit Number 1   Number of Visits 16   Date for PT Re-Evaluation 07/28/15   Authorization Type [employer]    PT Start Time 1100   PT Stop Time 1145   PT Time Calculation (min) 45 min   Activity Tolerance Patient tolerated treatment well   Behavior During Therapy [facility] for tasks assessed/performed      Past Medical History  Diagnosis Date  . Macular degeneration   . Varicose veins   . Arthritis   . Pneumonia     hx. of  . Chronic kidney disease     kidney stones  . History of hiatal hernia     Past Surgical History  Procedure Laterality Date  . Bunionectomy Right   . Joint replacement Right     Total hip replacement  . Joint replacement Right     Total knee replacement  . Orif wrist fracture Left   . Abdominal hysterectomy    . Tonsillectomy    . Cystoscopy with stent placement Right 08/15/2014    Procedure: CYSTOSCOPY WITH RIGHT STENT PLACEMENT;  Surgeon: [name], MD;  Location: [facility];  Service: Urology;  Laterality: Right;    There were no vitals filed for this visit.       Subjective Assessment - 06/02/15 1108    Subjective Patient initially went to the MD for R sided chest wall pain. It was found that she had a 11th rib fracture. She is no longer having any pain in that area. She did not know that she  had a fractured rib.    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 Watches her 50 year old granddaughter.    Limitations Walking;Standing   How long can you sit comfortably? N/A    How long can you stand  comfortably? N/A    How long can you walk comfortably? N/A    Diagnostic tests acute/subacute fracture of the 11th rib    Currently in Pain? No/denies   Multiple Pain Sites No            [facility] PT Assessment - 06/02/15 0001    Assessment   Medical Diagnosis Right rib fracture/ decreased balance    Referring Provider Dr Lavone Orn   Onset Date/Surgical Date 04/21/15   Next MD Visit None Scheduled    Precautions   Precautions None   Restrictions   Weight Bearing Restrictions No   Balance Screen   Has the patient fallen in the past 6 months Yes   How many times? 1   Has the patient had a decrease in activity level because of a fear of falling?  No   Is the patient reluctant to leave their home because of a fear of falling?  No   Home Environment   Additional Comments Lives alone at home.    Prior Function   Level of Independence Independent with household mobility with device   Cognition   Overall Cognitive Status Within Functional Limits for tasks assessed   Sensation   Light Touch Appears Intact  Coordination   Gross Motor Movements are Fluid and Coordinated Yes   Fine Motor Movements are Fluid and Coordinated Yes   ROM / Strength   AROM / PROM / Strength AROM;PROM;Strength   AROM   Overall AROM Comments full active shoulder flexion without pain    Strength   Overall Strength Comments Bilateral shoulder strength 5/5 Bilateral knee extesnion / hip flexion 4/5; all other movemts 5/5    Ambulation/Gait   Gait Comments ambualtes with single point cane R knee vlagus in standin; decreased bilateral hip flexion.    Balance   Balance Assessed Yes   Standardized Balance Assessment   Standardized Balance Assessment Berg Balance Test   Berg Balance Test   Sit to Stand Able to stand using hands after several tries   Standing Unsupported Able to stand 2 minutes with supervision   Sitting with Back Unsupported but Feet Supported on Floor or Stool Able to sit safely and securely  2 minutes   Stand to Sit Controls descent by using hands   Transfers Able to transfer safely, definite need of hands   Standing Unsupported with Eyes Closed Able to stand 10 seconds with supervision   Standing Ubsupported with Feet Together Able to place feet together independently and stand for 1 minute with supervision   From Standing, Reach Forward with Outstretched Arm Can reach confidently >25 cm (10")   From Standing Position, Pick up Object from Floor Able to pick up shoe, needs supervision   From Standing Position, Turn to Look Behind Over each Shoulder Looks behind one side only/other side shows less weight shift   Turn 360 Degrees Able to turn 360 degrees safely but slowly   Standing Unsupported, Alternately Place Feet on Step/Stool Needs assistance to keep from falling or unable to try   Standing Unsupported, One Foot in [employer] balance while stepping or standing   Standing on One Leg Tries to lift leg/unable to hold 3 seconds but remains standing independently   Total Score 34   High Level Balance   High Level Balance Comments decreased ability to reach out of base of support fwith narrow base. Poor dynmaic standing balance.                    [place] Adult PT Treatment/Exercise - 06/02/15 0001    Exercises   Exercises Knee/Hip   Knee/Hip Exercises: Seated   Long Arc Quad Limitations 1x10   Clamshell with TheraBand --  Y 2x10   Knee/Hip Exercises: Supine   Quad Sets Limitations x10 5 sec hold   Short Arc Quad Sets Limitations 1x10 bilateral    Heel Slides Limitations 1x5 bilateral                 PT Education - 06/02/15 1326    Education provided Yes   Education Details HEP; symptom management, POC   Person(s) Educated Patient   Methods Explanation   Comprehension Verbalized understanding;Returned demonstration          PT Short Term Goals - 06/02/15 1332    PT SHORT TERM GOAL #1   Title Pateint will demsotrate good static standing balance     Baseline poor    Time 4   Period Weeks   Status New   PT SHORT TERM GOAL #2   Title Patient will demsotrate 4+/5 gross bilateral lower extremity strength   Baseline 4/5 hip flexion and knee extension    Time 4   Period Weeks  Status New   PT SHORT TERM GOAL #3   Title Patient will be independent with HEP for bilateral lower extremity strengthening   Time 8   Period Weeks   Status New           PT Long Term Goals - 06-09-15 1334    PT LONG TERM GOAL #1   Title Patient will score > 41 on the BERG balance scale to put her in the low fall risk category    Time 8   Period Weeks   Status New   PT LONG TERM GOAL #2   Title Patient will ambulate at the super market without fatigue and without falls    Time 8   Period Weeks   Status New   PT LONG TERM GOAL #3   Title Patient will increase dynamic standing balance to good in order to safely perfrom IADL's    Time 8   Period Weeks   Status New               Plan - 06-09-2015 1328    Clinical Impression Statement Pt is an 82 year old female. Her perscription is for chest wall pain. She is no longer having chest wall pain. Her pain originated from a broken rib. She has had several falls in the past year. She feels like her balance is getting worse. She is a moderate fall rsik on the [employer] scale. She has bilateral lower extrmity weakness. She  was seen today for a low complexity evaluation. She  would benefit from skilled therapy.    Rehab Potential Good   PT Frequency 2x / week   PT Duration 8 weeks   PT Treatment/Interventions ADLs/Self Care Home Management;Cryotherapy;Electrical Stimulation;Moist Heat;Gait training;Stair training;Functional mobility training;Therapeutic activities;Therapeutic exercise;Balance training;Patient/family education   PT Next Visit Plan begin balanc exercisres, add in 3 way standing hip exercises,    PT Home Exercise Plan laq, saq, heel slide, seated clamshell, Quad set    Consulted and  Agree with Plan of Care Patient      Patient will benefit from skilled therapeutic intervention in order to improve the following deficits and impairments:  Abnormal gait, Decreased activity tolerance, Decreased balance, Decreased endurance, Difficulty walking, Decreased strength  Visit Diagnosis: Repeated falls  Muscle weakness (generalized)  Difficulty in walking, not elsewhere classified      G-Codes - 2015-06-09 1338    Functional Assessment Tool Used BERG balance scale, clinical decision making    Functional Limitation Mobility: Walking and moving around   Mobility: Walking and Moving Around Current Status [id number]) At least 20 percent but less than 40 percent impaired, limited or restricted   Mobility: Walking and Moving Around Goal Status [id number]) At least 1 percent but less than 20 percent impaired, limited or restricted       Problem List Patient Active Problem List   Diagnosis Date Noted  . Stone, kidney 08/15/2014  . Right nephrolithiasis 08/15/2014  . Pulmonary nodules 03/20/2014  . Dyspnea 03/19/2014    Carney Living PT DPT  2015/06/09, 1:47 PM  [facility] [address] [place], [place], [address] Phone: [phone]   Fax:  [phone]  Name: FIDELIS SUSANA MRN: [id number] Date of Birth: 1930/02/13

## 2015-06-13 ENCOUNTER — Ambulatory Visit: Payer: PPO | Attending: Internal Medicine | Admitting: Physical Therapy

## 2015-06-13 DIAGNOSIS — R262 Difficulty in walking, not elsewhere classified: Secondary | ICD-10-CM | POA: Insufficient documentation

## 2015-06-13 DIAGNOSIS — M6281 Muscle weakness (generalized): Secondary | ICD-10-CM | POA: Diagnosis not present

## 2015-06-13 DIAGNOSIS — R296 Repeated falls: Secondary | ICD-10-CM | POA: Diagnosis not present

## 2015-06-13 NOTE — Therapy (Signed)
Caroline Griffith, [place], [address] Phone: [phone]   Fax:  [phone]  Physical Therapy Treatment  Patient Details  Name: Caroline Griffith MRN: [id number] Date of Birth: [birth year] Referring Provider: Dr Lavone Orn  Encounter Date: 06/13/2015      PT End of Session - 06/13/15 1047    Visit Number 2   Number of Visits 16   Date for PT Re-Evaluation 07/28/15   Authorization Type [employer]    PT Start Time 1015   PT Stop Time 1100   PT Time Calculation (min) 45 min   Activity Tolerance Patient tolerated treatment well   Behavior During Therapy [facility] for tasks assessed/performed      Past Medical History  Diagnosis Date  . Macular degeneration   . Varicose veins   . Arthritis   . Pneumonia     hx. of  . Chronic kidney disease     kidney stones  . History of hiatal hernia     Past Surgical History  Procedure Laterality Date  . Bunionectomy Right   . Joint replacement Right     Total hip replacement  . Joint replacement Right     Total knee replacement  . Orif wrist fracture Left   . Abdominal hysterectomy    . Tonsillectomy    . Cystoscopy with stent placement Right 08/15/2014    Procedure: CYSTOSCOPY WITH RIGHT STENT PLACEMENT;  Surgeon: [name], MD;  Location: [facility];  Service: Urology;  Laterality: Right;    There were no vitals filed for this visit.      Subjective Assessment - 06/13/15 1029    Subjective Patient reports 8/10 pain in her back today. The apin in her back is effecting her mobility. She hasnt had any falls from the last visit.    Pertinent History Histroy of falls.    Limitations Standing;Walking   How long can you sit comfortably? N/A    How long can you stand comfortably? N/A    How long can you walk comfortably? N/A    Diagnostic tests acute/subacute fracture of the 11th rib    Currently in Pain? Yes   Pain Score 9    Pain Location Back   Pain  Orientation Lower   Pain Descriptors / Indicators Aching   Pain Onset More than a month ago   Pain Frequency Intermittent   Aggravating Factors  activity; positioning    Pain Relieving Factors rest,    Effect of Pain on Daily Activities allieve    Multiple Pain Sites No                         [facility] Adult PT Treatment/Exercise - 06/13/15 0001    High Level Balance   High Level Balance Comments tandem stance eyes open/ eyes closed 2x30sec; narrow base of support eyes open / eyes closed 2x30sec;    Knee/Hip Exercises: Stretches   Piriformis Stretch Limitations 2x20sec hold    Soleus Stretch Limitations single knee to chest stretch 2x20sec    Knee/Hip Exercises: Standing   Heel Raises Limitations 2x10   Hip Flexion Limitations 2x10   Knee/Hip Exercises: Seated   Long Arc Quad Limitations 2x10   Clamshell with TheraBand --  Y 2x10   Knee/Hip Exercises: Supine   Quad Sets Limitations x10    Heel Slides Limitations 1x5 bilateral    Knee Flexion Limitations supine knee flexion 2x10  PT Education - 06/13/15 1036    Education provided Yes   Person(s) Educated Patient   Methods Explanation   Comprehension Verbalized understanding;Returned demonstration          PT Short Term Goals - 06/02/15 1332    PT SHORT TERM GOAL #1   Title Pateint will demsotrate good static standing balance    Baseline poor    Time 4   Period Weeks   Status New   PT SHORT TERM GOAL #2   Title Patient will demsotrate 4+/5 gross bilateral lower extremity strength   Baseline 4/5 hip flexion and knee extension    Time 4   Period Weeks   Status New   PT SHORT TERM GOAL #3   Title Patient will be independent with HEP for bilateral lower extremity strengthening   Time 8   Period Weeks   Status New           PT Long Term Goals - 06/02/15 1334    PT LONG TERM GOAL #1   Title Patient will score > 41 on the BERG balance scale to put her in the low fall risk  category    Time 8   Period Weeks   Status New   PT LONG TERM GOAL #2   Title Patient will ambulate at the super market without fatigue and without falls    Time 8   Period Weeks   Status New   PT LONG TERM GOAL #3   Title Patient will increase dynamic standing balance to good in order to safely perfrom IADL's    Time 8   Period Weeks   Status New               Plan - 06/13/15 1048    Clinical Impression Statement Patient tolerated treatment well. She had minor hip pain with stretching. She tolerated balance exercises well.    Rehab Potential Good   PT Frequency 2x / week   PT Duration 8 weeks   PT Treatment/Interventions ADLs/Self Care Home Management;Cryotherapy;Electrical Stimulation;Moist Heat;Gait training;Stair training;Functional mobility training;Therapeutic activities;Therapeutic exercise;Balance training;Patient/family education   PT Next Visit Plan begin balanc exercisres, add in 3 way standing hip exercises,    PT Home Exercise Plan laq, saq, heel slide, seated clamshell, Quad set    Consulted and Agree with Plan of Care Patient      Patient will benefit from skilled therapeutic intervention in order to improve the following deficits and impairments:  Abnormal gait, Decreased activity tolerance, Decreased balance, Decreased endurance, Difficulty walking, Decreased strength  Visit Diagnosis: Repeated falls  Muscle weakness (generalized)  Difficulty in walking, not elsewhere classified     Problem List Patient Active Problem List   Diagnosis Date Noted  . Stone, kidney 08/15/2014  . Right nephrolithiasis 08/15/2014  . Pulmonary nodules 03/20/2014  . Dyspnea 03/19/2014    Carney Living PT DPT  06/13/2015, 11:28 AM  [facility] [address] [place], [place], [address] Phone: [phone]   Fax:  [phone]  Name: Caroline Griffith MRN: [id number] Date of Birth: Aug 08, 1930

## 2015-06-16 ENCOUNTER — Ambulatory Visit: Payer: PPO | Admitting: Physical Therapy

## 2015-06-16 DIAGNOSIS — R296 Repeated falls: Secondary | ICD-10-CM | POA: Diagnosis not present

## 2015-06-16 DIAGNOSIS — M6281 Muscle weakness (generalized): Secondary | ICD-10-CM

## 2015-06-16 DIAGNOSIS — R262 Difficulty in walking, not elsewhere classified: Secondary | ICD-10-CM

## 2015-06-16 NOTE — Therapy (Signed)
[place], [place], [address] Phone: [phone]   Fax:  [phone]  Physical Therapy Treatment  Patient Details  Name: Caroline Griffith MRN: [id number] Date of Birth: Nov 12, 1930 Referring Provider: Dr Lavone Orn  Encounter Date: 06/16/2015      PT End of Session - 06/16/15 1202    Visit Number 3   Number of Visits 16   Date for PT Re-Evaluation 07/28/15   PT Start Time 1103   PT Stop Time 1150   PT Time Calculation (min) 47 min   Activity Tolerance Patient tolerated treatment well   Behavior During Therapy [facility] for tasks assessed/performed      Past Medical History  Diagnosis Date  . Macular degeneration   . Varicose veins   . Arthritis   . Pneumonia     hx. of  . Chronic kidney disease     kidney stones  . History of hiatal hernia     Past Surgical History  Procedure Laterality Date  . Bunionectomy Right   . Joint replacement Right     Total hip replacement  . Joint replacement Right     Total knee replacement  . Orif wrist fracture Left   . Abdominal hysterectomy    . Tonsillectomy    . Cystoscopy with stent placement Right 08/15/2014    Procedure: CYSTOSCOPY WITH RIGHT STENT PLACEMENT;  Surgeon: [name], MD;  Location: [facility];  Service: Urology;  Laterality: Right;    There were no vitals filed for this visit.      Subjective Assessment - 06/16/15 1107    Subjective I have pain all over but not really that bad, I have arthritis.  I want to work on my balance and upper body strength.  Has not been doing her exercises.     Currently in Pain? Yes   Pain Score 2    Pain Location Back   Pain Orientation Lower   Pain Descriptors / Indicators Sore   Pain Type Chronic pain   Pain Onset More than a month ago   Pain Frequency Intermittent   Aggravating Factors  activity    Pain Relieving Factors rest             [facility] PT Assessment - 06/16/15 1115    Posture/Postural  Control   Posture/Postural Control Postural limitations   Postural Limitations Rounded Shoulders;Forward head;Increased thoracic kyphosis;Left pelvic obliquity   Posture Comments scoliosis rounded on mid Rt. thoracic spine,                      [facility] Adult PT Treatment/Exercise - 06/16/15 1115    High Level Balance   High Level Balance Comments toes up/heel raises, tandem stance each LE and added small ROM head turns added to HEP    Self-Care   Self-Care Posture;Other Self-Care Comments   Posture standing for prep and balance ex   Other Self-Care Comments  HEP    Knee/Hip Exercises: Stretches   Other Knee/Hip Stretches lower trunk rotation x 10    Knee/Hip Exercises: Aerobic   Nustep Level 7 UE and LE for strengthening   Knee/Hip Exercises: Standing   Heel Raises Both;2 sets;10 reps   Hip Flexion Stengthening;Both;1 set;10 reps   Hip Flexion Limitations pain with LLE weightbearing    Knee/Hip Exercises: Seated   Clamshell with TheraBand Green  x 20    Marching Limitations x 20   also done in standing  Knee/Hip Exercises: Supine   Quad Sets Strengthening;Both;1 set;10 reps   Heel Slides Limitations 1x5 bilateral   cramping    Bridges Limitations 2 x 10    Other Supine Knee/Hip Exercises  green band clam in supine                 PT Education - 06/16/15 1201    Education provided Yes   Education Details balance activities, safety   Person(s) Educated Patient   Methods Explanation;Handout   Comprehension Verbalized understanding;Returned demonstration          PT Short Term Goals - 06/16/15 1217    PT SHORT TERM GOAL #1   Title Pateint will demsotrate good static standing balance    Baseline depends on foot position   Status Partially Met   PT SHORT TERM GOAL #2   Title Patient will demsotrate 4+/5 gross bilateral lower extremity strength   Status On-going   PT SHORT TERM GOAL #3   Title Patient will be independent with HEP for bilateral  lower extremity strengthening   Status On-going           PT Long Term Goals - 06/16/15 1217    PT LONG TERM GOAL #1   Title Patient will score > 41 on the BERG balance scale to put her in the low fall risk category    Status On-going   PT LONG TERM GOAL #2   Title Patient will ambulate at the super market without fatigue and without falls    Status On-going   PT LONG TERM GOAL #3   Title Patient will increase dynamic standing balance to good in order to safely perfrom IADL's    Status On-going               Plan - 06/16/15 1212    Clinical Impression Statement Patient seems to have misunderstood the pain scale last visit.  She reports min pain overall in L hip and back with activity.  Muscle spasm in Rt. ant hip  with supine.  She was given balance HEP for her to work on and I asked her to bring in a copy of her HEP each session so she could get familiar with exercises.  She hasn't done any since her 1st visit. No goals met.      PT Treatment/Interventions ADLs/Self Care Home Management;Cryotherapy;Electrical Stimulation;Moist Heat;Gait training;Stair training;Functional mobility training;Therapeutic activities;Therapeutic exercise;Balance training;Patient/family education   PT Next Visit Plan check HEP for balance and then  add in 3 way standing hip exercises,    PT Home Exercise Plan laq, saq, heel slide, seated clamshell, Quad set , balance.  Pt more likely to do standing ex than supine.    Consulted and Agree with Plan of Care Patient      Patient will benefit from skilled therapeutic intervention in order to improve the following deficits and impairments:  Abnormal gait, Decreased activity tolerance, Decreased balance, Decreased endurance, Difficulty walking, Decreased strength  Visit Diagnosis: Repeated falls  Muscle weakness (generalized)  Difficulty in walking, not elsewhere classified     Problem List Patient Active Problem List   Diagnosis Date Noted  .  Stone, kidney 08/15/2014  . Right nephrolithiasis 08/15/2014  . Pulmonary nodules 03/20/2014  . Dyspnea 03/19/2014    Bora Broner 06/16/2015, 12:18 PM  [facility] [address] [place], [place], [address] Phone: [phone]   Fax:  [phone]  Name: SHENAE BONANNO MRN: [id number] Date of Birth: 1930/08/14  Raeford Razor, PT 06/16/2015 12:18 PM Phone: [phone] Fax: [phone]

## 2015-06-18 ENCOUNTER — Ambulatory Visit: Payer: PPO | Admitting: Physical Therapy

## 2015-06-18 DIAGNOSIS — R296 Repeated falls: Secondary | ICD-10-CM

## 2015-06-18 DIAGNOSIS — M6281 Muscle weakness (generalized): Secondary | ICD-10-CM

## 2015-06-18 DIAGNOSIS — R262 Difficulty in walking, not elsewhere classified: Secondary | ICD-10-CM

## 2015-06-18 NOTE — Therapy (Signed)
[place], [place], [address] Phone: [phone]   Fax:  [phone]  Physical Therapy Treatment  Patient Details  Name: Caroline Griffith MRN: [id number] Date of Birth: 27-Feb-1930 Referring Provider: Dr Lavone Orn  Encounter Date: 06/18/2015      PT End of Session - 06/18/15 2124    Visit Number 4   Number of Visits 16   Date for PT Re-Evaluation 07/28/15   Authorization Type [employer]    PT Start Time 1147   PT Stop Time 1226   PT Time Calculation (min) 39 min   Activity Tolerance Patient tolerated treatment well   Behavior During Therapy [facility] for tasks assessed/performed      Past Medical History  Diagnosis Date  . Macular degeneration   . Varicose veins   . Arthritis   . Pneumonia     hx. of  . Chronic kidney disease     kidney stones  . History of hiatal hernia     Past Surgical History  Procedure Laterality Date  . Bunionectomy Right   . Joint replacement Right     Total hip replacement  . Joint replacement Right     Total knee replacement  . Orif wrist fracture Left   . Abdominal hysterectomy    . Tonsillectomy    . Cystoscopy with stent placement Right 08/15/2014    Procedure: CYSTOSCOPY WITH RIGHT STENT PLACEMENT;  Surgeon: [name], MD;  Location: [facility];  Service: Urology;  Laterality: Right;    There were no vitals filed for this visit.      Subjective Assessment - 06/18/15 2120    Subjective --   Pertinent History Histroy of falls.    Limitations Standing;Walking   How long can you sit comfortably? N/A    How long can you stand comfortably? N/A    How long can you walk comfortably? N/A    Diagnostic tests acute/subacute fracture of the 11th rib    Currently in Pain? Yes   Pain Score 7    Pain Location Rib cage   Pain Orientation Right   Pain Descriptors / Indicators Aching   Pain Frequency Intermittent   Aggravating Factors  activity    Pain  Relieving Factors rest    Effect of Pain on Daily Activities difficulty using right arm    Multiple Pain Sites No                         [facility] Adult PT Treatment/Exercise - 06/18/15 0001    Knee/Hip Exercises: Aerobic   Nustep Level 7  LE only for strengthening   Knee/Hip Exercises: Standing   Heel Raises Both;2 sets;10 reps   Hip Flexion Limitations 2x10   Knee/Hip Exercises: Seated   Long Arc Quad Limitations 2x10   Ball Squeeze 2x10   Clamshell with TheraBand Green  x 20    Knee/Hip Exercises: Supine   Short Arc Quad Sets Limitations 2x10   Heel Slides Limitations 1x5 bilateral   cramping                 PT Education - 06/18/15 2123    Education Details Continue with HEP If pain worsens or patient has difficulty breathing seek immediate medical attention.    Person(s) Educated Patient   Methods Explanation   Comprehension Verbalized understanding;Returned demonstration          PT Short Term Goals - 06/18/15 2126  PT SHORT TERM GOAL #1   Title Pateint will demsotrate good static standing balance    Baseline depends on foot position   Time 4   Period Weeks   Status Partially Met   PT SHORT TERM GOAL #2   Title Patient will demsotrate 4+/5 gross bilateral lower extremity strength   Baseline 4/5 hip flexion and knee extension    Time 4   Period Weeks   PT SHORT TERM GOAL #3   Title Patient will be independent with HEP for bilateral lower extremity strengthening   Time 4   Period Weeks   Status On-going           PT Long Term Goals - 06/16/15 1217    PT LONG TERM GOAL #1   Title Patient will score > 41 on the BERG balance scale to put her in the low fall risk category    Status On-going   PT LONG TERM GOAL #2   Title Patient will ambulate at the super market without fatigue and without falls    Status On-going   PT LONG TERM GOAL #3   Title Patient will increase dynamic standing balance to good in order to safely perfrom  IADL's    Status On-going               Plan - 06/18/15 2125    Clinical Impression Statement Held on balance activity 2nd to gait belt and held on upper extremity exercises 2nd to possible rib fracture. Patient advised to go to the doctor tomorrow or the ED this afternoon if needed for an x-ray. Patient declined to go to the ED today for finanaical reasons. PT focused on light lower extremity exercises. No increase in pain with treatment. PT palpated rib and felt not actue dislocation. Patient still strongly advised to seek medical attention.    Rehab Potential Good   PT Frequency 2x / week   PT Duration 8 weeks   PT Treatment/Interventions ADLs/Self Care Home Management;Cryotherapy;Electrical Stimulation;Moist Heat;Gait training;Stair training;Functional mobility training;Therapeutic activities;Therapeutic exercise;Balance training;Patient/family education   PT Next Visit Plan check HEP for balance and then  add in 3 way standing hip exercises if cleared for rib fracture; continue with exercises    PT Home Exercise Plan laq, saq, heel slide, seated clamshell, Quad set , balance.  Pt more likely to do standing ex than supine.    Consulted and Agree with Plan of Care Patient      Patient will benefit from skilled therapeutic intervention in order to improve the following deficits and impairments:  Abnormal gait, Decreased activity tolerance, Decreased balance, Decreased endurance, Difficulty walking, Decreased strength  Visit Diagnosis: Repeated falls  Muscle weakness (generalized)  Difficulty in walking, not elsewhere classified     Problem List Patient Active Problem List   Diagnosis Date Noted  . Stone, kidney 08/15/2014  . Right nephrolithiasis 08/15/2014  . Pulmonary nodules 03/20/2014  . Dyspnea 03/19/2014    Carney Living PT DPT  06/18/2015, 9:27 PM  [facility] [address] [place], [place], [address] Phone:  [phone]   Fax:  [phone]  Name: SARALYNN LANGHORST MRN: [id number] Date of Birth: Sep 03, 1930

## 2015-06-19 ENCOUNTER — Ambulatory Visit
Admission: RE | Admit: 2015-06-19 | Discharge: 2015-06-19 | Disposition: A | Discharge status: Home / Self Care | Payer: PPO | Source: Ambulatory Visit | Attending: Internal Medicine | Admitting: Internal Medicine

## 2015-06-19 ENCOUNTER — Encounter: Payer: Self-pay | Admitting: Podiatry

## 2015-06-19 ENCOUNTER — Other Ambulatory Visit: Payer: Self-pay | Admitting: Internal Medicine

## 2015-06-19 ENCOUNTER — Ambulatory Visit (INDEPENDENT_AMBULATORY_CARE_PROVIDER_SITE_OTHER): Payer: PPO | Admitting: Podiatry

## 2015-06-19 VITALS — BP 102/71 | HR 75 | Resp 16

## 2015-06-19 DIAGNOSIS — Q828 Other specified congenital malformations of skin: Secondary | ICD-10-CM

## 2015-06-19 DIAGNOSIS — R079 Chest pain, unspecified: Secondary | ICD-10-CM | POA: Diagnosis not present

## 2015-06-19 DIAGNOSIS — R0789 Other chest pain: Secondary | ICD-10-CM

## 2015-06-19 DIAGNOSIS — L84 Corns and callosities: Secondary | ICD-10-CM

## 2015-06-19 DIAGNOSIS — I739 Peripheral vascular disease, unspecified: Secondary | ICD-10-CM

## 2015-06-19 DIAGNOSIS — R29898 Other symptoms and signs involving the musculoskeletal system: Secondary | ICD-10-CM

## 2015-06-19 DIAGNOSIS — L909 Atrophic disorder of skin, unspecified: Secondary | ICD-10-CM

## 2015-06-20 NOTE — Progress Notes (Signed)
Subjective:     Patient ID: Caroline Griffith, female   DOB: [birth year], 82 y.o.   MRN: [id number]  HPI patient presents stating she is developing painful calluses underneath the first metatarsal of both feet and she has developed in balance and she has fallen 5 times and last time broke several of her ribs which is becoming increasingly a difficult process for her   Review of Systems     Objective:   Physical Exam Neurovascular status intact with mild diminishment of sharp [employer] vibratory bilateral and patient who is developing increasing in balance issues    Assessment:     Callus formation along with balance issues secondary to age and possible neuropathic like condition    Plan:     Reviewed conditions and recommended at this time debridement which was accomplished and long-term balance brace is to try to give her better stability with gait and I've also advised her to try to work and walking a little bit quicker to see if it'll prevent her from rolling side to side. Patient is scheduled for brace therapy

## 2015-06-23 ENCOUNTER — Ambulatory Visit: Payer: PPO | Admitting: Physical Therapy

## 2015-06-24 DIAGNOSIS — H353212 Exudative age-related macular degeneration, right eye, with inactive choroidal neovascularization: Secondary | ICD-10-CM | POA: Diagnosis not present

## 2015-06-25 ENCOUNTER — Encounter: Payer: PPO | Admitting: Physical Therapy

## 2015-06-26 DIAGNOSIS — H353222 Exudative age-related macular degeneration, left eye, with inactive choroidal neovascularization: Secondary | ICD-10-CM | POA: Diagnosis not present

## 2015-06-30 ENCOUNTER — Ambulatory Visit: Payer: PPO | Admitting: Physical Therapy

## 2015-06-30 DIAGNOSIS — M6281 Muscle weakness (generalized): Secondary | ICD-10-CM

## 2015-06-30 DIAGNOSIS — R296 Repeated falls: Secondary | ICD-10-CM

## 2015-06-30 DIAGNOSIS — R262 Difficulty in walking, not elsewhere classified: Secondary | ICD-10-CM

## 2015-06-30 NOTE — Therapy (Signed)
[place], [place], [address] Phone: [phone]   Fax:  [phone]  Physical Therapy Treatment  Patient Details  Name: Caroline Griffith MRN: [id number] Date of Birth: 10-25-1930 Referring Provider: Dr Lavone Orn  Encounter Date: 06/30/2015      PT End of Session - 06/30/15 1253    Visit Number 5   Number of Visits 16   Date for PT Re-Evaluation 07/28/15   PT Start Time 1100   PT Stop Time 1144   PT Time Calculation (min) 44 min   Activity Tolerance Patient tolerated treatment well   Behavior During Therapy [facility] for tasks assessed/performed      Past Medical History  Diagnosis Date  . Macular degeneration   . Varicose veins   . Arthritis   . Pneumonia     hx. of  . Chronic kidney disease     kidney stones  . History of hiatal hernia     Past Surgical History  Procedure Laterality Date  . Bunionectomy Right   . Joint replacement Right     Total hip replacement  . Joint replacement Right     Total knee replacement  . Orif wrist fracture Left   . Abdominal hysterectomy    . Tonsillectomy    . Cystoscopy with stent placement Right 08/15/2014    Procedure: CYSTOSCOPY WITH RIGHT STENT PLACEMENT;  Surgeon: [name], MD;  Location: [facility];  Service: Urology;  Laterality: Right;    There were no vitals filed for this visit.      Subjective Assessment - 06/30/15 1104    Subjective Fell again (> week ago) and broke another rib. No pain now unless I lie on that Rt. side.             [facility] PT Assessment - 06/30/15 1111    Strength   Right Hip Flexion 3+/5   Right Hip Extension 3-/5   Right Hip ABduction 3-/5   Left Hip Flexion 4/5   Left Hip Extension 3-/5   Left Hip ABduction 3-/5   Right Knee Flexion 5/5   Right Knee Extension 4/5   Left Knee Flexion 4/5   Left Knee Extension 4/5   Right Ankle Dorsiflexion 4+/5   Left Ankle Dorsiflexion 3+/5           [facility] Adult  PT Treatment/Exercise - 06/30/15 1125    High Level Balance   High Level Balance Comments narrow BOS static and with horizonal head turns (very difficult), tandem and neck flex/ext for dynamic challenge,  trunkn rotations   Self-Care   Posture throughout session , importance of cre strength    Other Self-Care Comments  Walker for balance and safety   Knee/Hip Exercises: Standing   Heel Raises Both;1 set;20 reps   Hip Abduction Stengthening;Both;1 set;20 reps;Knee straight   Hip Extension Stengthening;Both;1 set;20 reps;Knee straight   Other Standing Knee Exercises toe raises x 20    Knee/Hip Exercises: Supine   Bridges Limitations 2 x 10 for HEP    Straight Leg Raises Strengthening;Both;1 set;10 reps   Knee/Hip Exercises: Sidelying   Hip ABduction Strengthening;Both;1 set;10 reps   Knee/Hip Exercises: Prone   Hip Extension Strengthening;Both;1 set;10 reps                PT Education - 06/30/15 1253    Education provided Yes   Education Details strength, balance and safety with HEP    Person(s) Educated Patient   Methods  Explanation;Demonstration;Verbal cues;Handout   Comprehension Verbalized understanding;Returned demonstration;Need further instruction          PT Short Term Goals - 06/18/15 2126    PT SHORT TERM GOAL #1   Title Pateint will demsotrate good static standing balance    Baseline depends on foot position   Time 4   Period Weeks   Status Partially Met   PT SHORT TERM GOAL #2   Title Patient will demsotrate 4+/5 gross bilateral lower extremity strength   Baseline 4/5 hip flexion and knee extension    Time 4   Period Weeks   PT SHORT TERM GOAL #3   Title Patient will be independent with HEP for bilateral lower extremity strengthening   Time 4   Period Weeks   Status On-going           PT Long Term Goals - 06/30/15 1300    PT LONG TERM GOAL #1   Title Patient will score > 41 on the BERG balance scale to put her in the low fall risk category     Status On-going   PT LONG TERM GOAL #2   Title Patient will ambulate at the super market without fatigue and without falls    Status On-going   PT LONG TERM GOAL #3   Title Patient will increase dynamic standing balance to good in order to safely perfrom IADL's    Status On-going               Plan - 06/30/15 1254    Clinical Impression Statement Pt with hip and core weakness which may be contributing to her frequency of falls.  She was able to establish a mat/bed strengthening program without increasing pain.  No pain during her session, min only when she lays on Rt. side.  Recommeded patient use a walker but she was vry resistant to using one.  She also said she thinks that if she falls again "that may be it for me"   PT Next Visit Plan check HEP,  balance     PT Home Exercise Plan laq, saq, heel slide, seated clamshell, Quad set , balance.  Pt more likely to do standing ex than supine. Gave bridgind and 3 way hip supine, SL and prone    Consulted and Agree with Plan of Care Patient      Patient will benefit from skilled therapeutic intervention in order to improve the following deficits and impairments:  Abnormal gait, Decreased activity tolerance, Decreased balance, Decreased endurance, Difficulty walking, Decreased strength  Visit Diagnosis: Repeated falls  Muscle weakness (generalized)  Difficulty in walking, not elsewhere classified     Problem List Patient Active Problem List   Diagnosis Date Noted  . Stone, kidney 08/15/2014  . Right nephrolithiasis 08/15/2014  . Pulmonary nodules 03/20/2014  . Dyspnea 03/19/2014    PAA,JENNIFER 06/30/2015, 1:02 PM  [facility] [address] [place], [place], [address] Phone: [phone]   Fax:  [phone]  Name: Caroline Griffith MRN: [id number] Date of Birth: February 14, 1930    Raeford Razor, PT 06/30/2015 1:02 PM Phone: [phone] Fax: [phone]

## 2015-06-30 NOTE — Patient Instructions (Signed)
Hip Extension (Prone)   Lift left leg _6-12___ inches from floor, keeping knee locked. Repeat ___10_ times per set. Do _2___ sets per session. Do __1__ sessions per day.  http://orth.exer.us/98    Copyright  VHI. All rights reserved.  Abduction: Side Leg Lift (Eccentric) - Side-Lying   Lie on side. Lift top leg slightly higher than shoulder level. Keep top leg straight with body, toes pointing forward. Slowly lower for 3-5 seconds. _10__ reps per set, _2__ sets per session, _5__ days per week, 1 time per day.  Copyright  VHI. All rights reserved.  Strengthening: Straight Leg Raise (Phase 1)   Tighten muscles on front of right thigh, then lift leg ___6-10_ inches from surface, keeping knee locked. Stop when your knees come to a level position.  Repeat _10___ times per set. Do __2__ sets per session. Do __2__ sessions per day.  http://orth.exer.us/614   Copyright  VHI. All rights reserved.    Bridge   Lie back, legs bent. Inhale, pressing hips up. Keeping ribs in, lengthen lower back. Exhale, rolling down along spine from top. Repeat ___10_ times, 2 sets.  Do ___1_ sessions per day.

## 2015-07-03 ENCOUNTER — Ambulatory Visit: Payer: PPO | Admitting: Physical Therapy

## 2015-07-03 DIAGNOSIS — R262 Difficulty in walking, not elsewhere classified: Secondary | ICD-10-CM

## 2015-07-03 DIAGNOSIS — R296 Repeated falls: Secondary | ICD-10-CM | POA: Diagnosis not present

## 2015-07-03 DIAGNOSIS — M6281 Muscle weakness (generalized): Secondary | ICD-10-CM

## 2015-07-03 NOTE — Therapy (Signed)
[place], [place], [address] Phone: [phone]   Fax:  [phone]  Physical Therapy Treatment  Patient Details  Name: Caroline Griffith MRN: [id number] Date of Birth: 11-Jan-1931 Referring Provider: Dr Lavone Orn  Encounter Date: 07/03/2015      PT End of Session - 07/03/15 1220    Visit Number 6   Number of Visits 16   Date for PT Re-Evaluation 07/28/15   Authorization Type [employer]    PT Start Time 1150   PT Stop Time 1230   PT Time Calculation (min) 40 min   Activity Tolerance Patient tolerated treatment well      Past Medical History  Diagnosis Date  . Macular degeneration   . Varicose veins   . Arthritis   . Pneumonia     hx. of  . Chronic kidney disease     kidney stones  . History of hiatal hernia     Past Surgical History  Procedure Laterality Date  . Bunionectomy Right   . Joint replacement Right     Total hip replacement  . Joint replacement Right     Total knee replacement  . Orif wrist fracture Left   . Abdominal hysterectomy    . Tonsillectomy    . Cystoscopy with stent placement Right 08/15/2014    Procedure: CYSTOSCOPY WITH RIGHT STENT PLACEMENT;  Surgeon: [name], MD;  Location: [facility];  Service: Urology;  Laterality: Right;    There were no vitals filed for this visit.      Subjective Assessment - 07/03/15 1159    Subjective Pt reported no falls since last visit. Pt reporting pain 1/10 discomfort in low back. Pt reported she has not been doing her HEP.    Pertinent History Histroy of falls.    Limitations Lifting   Currently in Pain? Yes   Pain Score 1    Pain Location Back   Pain Orientation Lower;Right;Left   Pain Descriptors / Indicators Discomfort   Pain Type Chronic pain   Pain Onset More than a month ago   Pain Frequency Intermittent                         [facility] Adult PT Treatment/Exercise - 07/03/15 0001     High Level Balance   High Level Balance Comments side stepping with bil upper extremithy support 20 feet  x2 each direction with instructions to kep and hips forward when stepping.    Self-Care   Posture instructed in posture correction throughout session   Other Self-Care Comments  Recommended pt use her walker to improve her balance and safety.   Reviewed all supine and standing HEP   Knee/Hip Exercises: Standing   Heel Raises Both;10 reps   Hip Flexion Stengthening;Both;10 reps;2 sets   Hip Abduction Both;10 reps;Knee straight;2 sets   Hip Extension Stengthening;Both;20 reps;Knee straight;2 sets   Other Standing Knee Exercises toe raises x10 2 sets   Knee/Hip Exercises: Supine   Straight Leg Raises 10 reps;1 set;Both;Strengthening   Knee/Hip Exercises: Sidelying   Hip ABduction Strengthening;Both;1 set;10 reps;Right  Left leg performed standing due to rib pain in R sidelying                PT Education - 07/03/15 1220    Education Details Reviewed pt's HEP   Person(s) Educated Patient   Methods Explanation;Demonstration   Comprehension Verbalized understanding;Returned demonstration  PT Short Term Goals - 07/03/15 1251    PT SHORT TERM GOAL #1   Title Pateint will demsotrate good static standing balance    Baseline depends on foot position   Time 4   Period Weeks   Status Partially Met   PT SHORT TERM GOAL #2   Title Patient will demsotrate 4+/5 gross bilateral lower extremity strength   Baseline 4/5 hip flexion and knee extension    Time 4   Period Weeks   Status On-going   PT SHORT TERM GOAL #3   Title Patient will be independent with HEP for bilateral lower extremity strengthening   Time 4   Period Weeks   Status On-going           PT Long Term Goals - 06/30/15 1300    PT LONG TERM GOAL #1   Title Patient will score > 41 on the BERG balance scale to put her in the low fall risk category    Status On-going   PT LONG TERM GOAL #2   Title  Patient will ambulate at the super market without fatigue and without falls    Status On-going   PT LONG TERM GOAL #3   Title Patient will increase dynamic standing balance to good in order to safely perfrom IADL's    Status On-going               Plan - 07/03/15 1224    Clinical Impression Statement Pt reported not performing her HEP. Pt reviewed all supine and standing exercises with no increase in pain during session. Pt required increased time for transitioning between exercises and reported 2 episodes of dizziness when transitioning from supine to sit and when turning during amb.    Rehab Potential Good   PT Treatment/Interventions ADLs/Self Care Home Management;Cryotherapy;Electrical Stimulation;Moist Heat;Gait training;Stair training;Functional mobility training;Therapeutic activities;Therapeutic exercise;Balance training;Patient/family education   PT Next Visit Plan Continue to progress LE strengtheing and Balance   PT Home Exercise Plan Continue HEP: laq, saq, heel slide, seated clamshell, Quad set , balance.  Pt more likely to do standing ex than supine. Gave bridging and 3 way hip supine, SL and prone (no new exercises added this visit)   Consulted and Agree with Plan of Care Patient      Patient will benefit from skilled therapeutic intervention in order to improve the following deficits and impairments:  Abnormal gait, Decreased activity tolerance, Decreased balance, Decreased endurance, Difficulty walking, Decreased strength  Visit Diagnosis: Repeated falls  Muscle weakness (generalized)  Difficulty in walking, not elsewhere classified     Problem List Patient Active Problem List   Diagnosis Date Noted  . Stone, kidney 08/15/2014  . Right nephrolithiasis 08/15/2014  . Pulmonary nodules 03/20/2014  . Dyspnea 03/19/2014    Oretha Caprice, MPT 07/03/2015, 12:51 PM  [facility] [address] [place], [place], [address] Phone: [phone]   Fax:  [phone]  Name: Caroline Griffith MRN: [id number] Date of Birth: 1930/08/09

## 2015-07-07 ENCOUNTER — Ambulatory Visit: Payer: PPO | Admitting: Physical Therapy

## 2015-07-07 DIAGNOSIS — R296 Repeated falls: Secondary | ICD-10-CM | POA: Diagnosis not present

## 2015-07-07 DIAGNOSIS — M6281 Muscle weakness (generalized): Secondary | ICD-10-CM

## 2015-07-07 DIAGNOSIS — R262 Difficulty in walking, not elsewhere classified: Secondary | ICD-10-CM

## 2015-07-08 NOTE — Therapy (Signed)
Caroline Griffith, [place], [address] Phone: [phone]   Fax:  [phone]  Physical Therapy Treatment  Patient Details  Name: Caroline Griffith MRN: [id number] Date of Birth: 1930/01/16 Referring Provider: Dr Lavone Orn  Encounter Date: 07/07/2015      PT End of Session - 07/08/15 1024    Visit Number 7   Number of Visits 16   Date for PT Re-Evaluation 07/28/15   Authorization Type [employer]    PT Start Time 1100   PT Stop Time 1140   PT Time Calculation (min) 40 min      Past Medical History  Diagnosis Date  . Macular degeneration   . Varicose veins   . Arthritis   . Pneumonia     hx. of  . Chronic kidney disease     kidney stones  . History of hiatal hernia     Past Surgical History  Procedure Laterality Date  . Bunionectomy Right   . Joint replacement Right     Total hip replacement  . Joint replacement Right     Total knee replacement  . Orif wrist fracture Left   . Abdominal hysterectomy    . Tonsillectomy    . Cystoscopy with stent placement Right 08/15/2014    Procedure: CYSTOSCOPY WITH RIGHT STENT PLACEMENT;  Surgeon: [name], MD;  Location: [facility];  Service: Urology;  Laterality: Right;    There were no vitals filed for this visit.      Subjective Assessment - 07/07/15 1115    Subjective Patient has had no falls since the last visit. She feels like her balance is more off today. She felt good after the last visit.    Patient is accompained by: Family member   Pertinent History Histroy of falls.    Limitations Lifting   How long can you sit comfortably? N/A    How long can you stand comfortably? N/A    How long can you walk comfortably? N/A    Diagnostic tests acute/subacute fracture of the 11th rib    Currently in Pain? No/denies                         [facility] Adult PT Treatment/Exercise - 07/08/15 0001    High Level Balance   High  Level Balance Comments narrow BOS static and with horizonal head turns (very difficult), tandem and neck flex/ext for dynamic challenge,  trunk rotations; 4 inch step touches with CGA.    Knee/Hip Exercises: Aerobic   Nustep Level 7  LE only for strengthening   Knee/Hip Exercises: Standing   Heel Raises Both;10 reps   Hip Flexion Stengthening;Both;10 reps;2 sets   Hip Abduction Both;10 reps;Knee straight;2 sets   Hip Extension Stengthening;Both;20 reps;Knee straight;2 sets   Other Standing Knee Exercises toe raises x10 2 sets   Knee/Hip Exercises: Seated   Long Arc Quad Limitations 2x10   Ball Squeeze 2x10   Clamshell with TheraBand Yellow  x 20    Sit to Sand 5 reps;with UE support                PT Education - 07/08/15 1022    Education Details continue with leg strengthening at home.    Person(s) Educated Patient   Methods Explanation;Demonstration   Comprehension Verbalized understanding;Returned demonstration          PT Short Term Goals - 07/08/15 1033    PT SHORT TERM  GOAL #1   Title Pateint will demsotrate good static standing balance    Baseline still fair    Time 4   Period Weeks   Status On-going   PT SHORT TERM GOAL #2   Title Patient will demsotrate 4+/5 gross bilateral lower extremity strength   Baseline 4/5 hip flexion and knee extension    Time 4   Period Weeks   Status On-going   PT SHORT TERM GOAL #3   Title Patient will be independent with HEP for bilateral lower extremity strengthening   Time 4   Period Weeks   Status On-going           PT Long Term Goals - 07/08/15 1034    PT LONG TERM GOAL #1   Title Patient will score > 41 on the BERG balance scale to put her in the low fall risk category    Time 8   Period Weeks   Status On-going   PT LONG TERM GOAL #2   Title Patient will ambulate [place] the [place] market without fatigue and without falls    Time 8   Period Weeks   Status On-going   PT LONG TERM GOAL #3   Title Patient will  increase dynamic standing balance to good in order to safely perfrom IADL's    Time 8   Period Weeks   Status On-going               Plan - 07/08/15 1028    Clinical Impression Statement Patient required a little more assist wih balance today. She reported just feeling a little off. Therapy also worked on sit to stand from [employer] chair. She required mod cuing for hand placement and weight shift. She has not reached any new goals today.    Rehab Potential Good   PT Frequency 2x / week   PT Duration 8 weeks   PT Treatment/Interventions ADLs/Self Care Home Management;Cryotherapy;Electrical Stimulation;Moist Heat;Gait training;Stair training;Functional mobility training;Therapeutic activities;Therapeutic exercise;Balance training;Patient/family education   PT Next Visit Plan Continue to progress LE strengtheing and Balance   PT Home Exercise Plan Continue HEP: laq, saq, heel slide, seated clamshell, Quad set , balance.  Pt more likely to do standing ex than supine. Gave bridging and 3 way hip supine, SL and prone (no new exercises added this visit)   Consulted and Agree with Plan of Care Patient      Patient will benefit from skilled therapeutic intervention in order to improve the following deficits and impairments:  Abnormal gait, Decreased activity tolerance, Decreased balance, Decreased endurance, Difficulty walking, Decreased strength  Visit Diagnosis: Repeated falls  Muscle weakness (generalized)  Difficulty in walking, not elsewhere classified     Problem List Patient Active Problem List   Diagnosis Date Noted  . Stone, kidney 08/15/2014  . Right nephrolithiasis 08/15/2014  . Pulmonary nodules 03/20/2014  . Dyspnea 03/19/2014    Carney Living PT DPT 07/08/2015, 10:43 AM  [facility] [address] [place], [place], [address] Phone: [phone]   Fax:  [phone]  Name: Caroline Griffith MRN: [id number] Date of  Birth: [birth year]

## 2015-07-09 ENCOUNTER — Ambulatory Visit: Payer: PPO | Admitting: Physical Therapy

## 2015-07-09 DIAGNOSIS — M6281 Muscle weakness (generalized): Secondary | ICD-10-CM

## 2015-07-09 DIAGNOSIS — R296 Repeated falls: Secondary | ICD-10-CM

## 2015-07-09 DIAGNOSIS — R262 Difficulty in walking, not elsewhere classified: Secondary | ICD-10-CM

## 2015-07-09 NOTE — Therapy (Signed)
[place], [place], [address] Phone: [phone]   Fax:  [phone]  Physical Therapy Treatment  Patient Details  Name: Caroline Griffith MRN: [id number] Date of Birth: 29-Feb-1930 Referring Provider: Dr Lavone Orn  Encounter Date: 07/09/2015      PT End of Session - 07/09/15 1108    Visit Number 8   Number of Visits 16   PT Start Time 7564   PT Stop Time 1145   PT Time Calculation (min) 42 min      Past Medical History  Diagnosis Date  . Macular degeneration   . Varicose veins   . Arthritis   . Pneumonia     hx. of  . Chronic kidney disease     kidney stones  . History of hiatal hernia     Past Surgical History  Procedure Laterality Date  . Bunionectomy Right   . Joint replacement Right     Total hip replacement  . Joint replacement Right     Total knee replacement  . Orif wrist fracture Left   . Abdominal hysterectomy    . Tonsillectomy    . Cystoscopy with stent placement Right 08/15/2014    Procedure: CYSTOSCOPY WITH RIGHT STENT PLACEMENT;  Surgeon: [name], MD;  Location: [facility];  Service: Urology;  Laterality: Right;    There were no vitals filed for this visit.      Subjective Assessment - 07/09/15 1106    Subjective No increased pain today.  I want you to work me.  Pt confused with time of session , able to accomodate her. "I want to work my arms"               [facility] Adult PT Treatment/Exercise - 07/09/15 1110    Therapeutic Activites    Therapeutic Activities Other Therapeutic Activities  balance: static with head turns, rotation x 5 each direction   Other Therapeutic Activities balance on foam pad (AIREX) worked upper body to challenge balance: row, ext with yellow x 15 reps, facing forward flexion bilateral and unilateral, yellow band , horiz abd red x 10 and ER/IR red band x 10 , pt required min guard assist for balance     Knee/Hip Exercises: Standing   Heel Raises Both;1 set;20 reps   Hip Abduction Both;10 reps;Knee straight;2 sets   Abduction Limitations cues for level pelvis and posture   Hip Extension Stengthening;Both;1 set;10 reps   Extension Limitations same cues    Knee/Hip Exercises: Supine   Bridges Limitations x 2x 10    Straight Leg Raises Strengthening;Both;1 set;10 reps   Straight Leg Raises Limitations hip flexion and abd combined to make 1 rep    Other Supine Knee/Hip Exercises calm with band x 20    Knee/Hip Exercises: Sidelying   Hip ABduction Strengthening;Both;1 set;10 reps   Clams red band x 20                PT Education - 07/09/15 1141    Education provided Yes   Education Details 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, Retail buyer) Educated Patient   Methods Explanation;Verbal cues   Comprehension Verbalized understanding;Returned demonstration          PT Short Term Goals - 07/09/15 1137    PT SHORT TERM GOAL #3   Title Patient will be independent with HEP for bilateral lower extremity strengthening   Baseline has so many, able to do some today with min  cueing    Status Partially Met           PT Long Term Goals - 07/08/15 1034    PT LONG TERM GOAL #1   Title Patient will score > 41 on the BERG balance scale to put her in the low fall risk category    Time 8   Period Weeks   Status On-going   PT LONG TERM GOAL #2   Title Patient will ambulate [place] the [place] market without fatigue and without falls    Time 8   Period Weeks   Status On-going   PT LONG TERM GOAL #3   Title Patient will increase dynamic standing balance to good in order to safely perfrom IADL's    Time 8   Period Weeks   Status On-going               Plan - 07/09/15 1322    Clinical Impression Statement Patient needs more steadying A with activities involving trunk and head movements rather than arms.  Use of UE theraband seemed to help maintain her balance even on Airex. Rt. hip abd very weak in  standiong and sidelying work, 3/5  by observation. She did come to PT on the wrong day and said she was confused to where she was going today.  Monitor patient for other signs of cogntiive decline and consider further assessment, neurologist ?    PT Next Visit Plan Continue to progress LE strengtheing and Balance, incorporate UE as well.  consider retest BERG   PT Home Exercise Plan Continue HEP: laq, saq, heel slide, seated clamshell, Quad set , balance.  Pt more likely to do standing ex than supine. Gave bridging and 3 way hip supine, SL and prone (no new exercises added this visit)   Consulted and Agree with Plan of Care Patient      Patient will benefit from skilled therapeutic intervention in order to improve the following deficits and impairments:  Abnormal gait, Decreased activity tolerance, Decreased balance, Decreased endurance, Difficulty walking, Decreased strength, Decreased cognition, Postural dysfunction, Dizziness  Visit Diagnosis: Repeated falls  Muscle weakness (generalized)  Difficulty in walking, not elsewhere classified     Problem List Patient Active Problem List   Diagnosis Date Noted  . Stone, kidney 08/15/2014  . Right nephrolithiasis 08/15/2014  . Pulmonary nodules 03/20/2014  . Dyspnea 03/19/2014    Caroline Griffith 07/09/2015, 1:29 PM  [facility] [address] [place], [place], [address] Phone: [phone]   Fax:  [phone]  Name: Caroline Griffith MRN: [id number] Date of Birth: September 10, 1930    Raeford Razor, PT 07/09/2015 1:29 PM Phone: [phone] Fax: [phone]

## 2015-07-10 ENCOUNTER — Encounter: Payer: PPO | Admitting: Physical Therapy

## 2015-07-21 ENCOUNTER — Ambulatory Visit: Payer: PPO | Attending: Internal Medicine | Admitting: Physical Therapy

## 2015-07-21 DIAGNOSIS — M6281 Muscle weakness (generalized): Secondary | ICD-10-CM | POA: Diagnosis not present

## 2015-07-21 DIAGNOSIS — R296 Repeated falls: Secondary | ICD-10-CM | POA: Diagnosis not present

## 2015-07-21 DIAGNOSIS — R262 Difficulty in walking, not elsewhere classified: Secondary | ICD-10-CM | POA: Diagnosis not present

## 2015-07-21 NOTE — Therapy (Signed)
[place], [place], [address] Phone: [phone]   Fax:  [phone]  Physical Therapy Treatment  Patient Details  Name: Caroline Griffith MRN: [id number] Date of Birth: [birth year] Referring Provider: Dr Lavone Orn  Encounter Date: 07/21/2015      PT End of Session - 07/21/15 1750    Visit Number 9   Number of Visits 16   Date for PT Re-Evaluation 07/28/15   PT Start Time 3838   PT Stop Time 1633   PT Time Calculation (min) 48 min   Activity Tolerance Patient tolerated treatment well   Behavior During Therapy [facility] for tasks assessed/performed      Past Medical History  Diagnosis Date  . Macular degeneration   . Varicose veins   . Arthritis   . Pneumonia     hx. of  . Chronic kidney disease     kidney stones  . History of hiatal hernia     Past Surgical History  Procedure Laterality Date  . Bunionectomy Right   . Joint replacement Right     Total hip replacement  . Joint replacement Right     Total knee replacement  . Orif wrist fracture Left   . Abdominal hysterectomy    . Tonsillectomy    . Cystoscopy with stent placement Right 08/15/2014    Procedure: CYSTOSCOPY WITH RIGHT STENT PLACEMENT;  Surgeon: [name], MD;  Location: [facility];  Service: Urology;  Laterality: Right;    There were no vitals filed for this visit.      Subjective Assessment - 07/21/15 1556    Subjective Ribs 7/10.  Back has not been hurtinmg. I am obviously more confused.  I forgot when my appointment was.  Ablr to use right arm without pain today.    Currently in Pain? Yes   Pain Score 7    Pain Location Rib cage   Pain Orientation Right   Pain Descriptors / Indicators Aching   Aggravating Factors  sleeping on right   Pain Relieving Factors rest                         [facility] Adult PT Treatment/Exercise - 07/21/15 0001    High Level Balance   High Level Balance Activities --  static  stand with head movements challanging CGA required.     High Level Balance Comments CGA with small steps side to side ,  forward and back.    Patient able to do with intermittant use of hands.     Knee/Hip Exercises: Aerobic   Nustep Level 4-7 arms and legs only   Knee/Hip Exercises: Standing   Extension Limitations Balance work with pillowcase,  CGA  single leg stand with other leg  sliding pillow case on floor 5 x forward flexion and abduction 5 x each leg each way.     Knee/Hip Exercises: Seated   Long Arc Quad 5 reps  10 second holds, eccentric lowering.  AAflexion to extension   Long Arc Quad Weight 4 lbs.   Long [employer] Limitations RT hip min assist to keep knee neutrai (Tends toi IR)  each  knee   Hamstring Curl 10 reps  red band, each knee   Knee/Hip Exercises: Sidelying   Hip ABduction Limitations 10 X each hip  NO reib pain with right sidelying.    Clams 10 X  PT Short Term Goals - 07/21/15 1754    PT SHORT TERM GOAL #1   Title Pateint will demsotrate good static standing balance    Baseline still fair    Time 4   Period Weeks   Status On-going   PT SHORT TERM GOAL #2   Title Patient will demsotrate 4+/5 gross bilateral lower extremity strength   Time 4   Period Weeks   Status Unable to assess   PT SHORT TERM GOAL #3   Title Patient will be independent with HEP for bilateral lower extremity strengthening   Baseline Patient has not been consistant with doing her exercises (visited her daughter over the weekend)   Time 4   Period Weeks   Status Partially Met           PT Long Term Goals - 07/08/15 1034    PT LONG TERM GOAL #1   Title Patient will score > 41 on the BERG balance scale to put her in the low fall risk category    Time 8   Period Weeks   Status On-going   PT LONG TERM GOAL #2   Title Patient will ambulate at the [place] market without fatigue and without falls    Time 8   Period Weeks   Status On-going   PT LONG  TERM GOAL #3   Title Patient will increase dynamic standing balance to good in order to safely perfrom IADL's    Time 8   Period Weeks   Status On-going               Plan - 07/21/15 1750    Clinical Impression Statement Patient is having less rib pain and was able to tolerate session without increased pain.  She was unable to single leg stand, however she was able to move with smaller motions/ weight shifting with less support than initially.  She continues to loose her balance with head movwmwnts and turning.    PT Next Visit Plan Continue to progress LE strengtheing and Balance, incorporate UE as well.  consider retest BERG  She may need FOTO.   PT Home Exercise Plan continue,  did not add any new exercises.    Consulted and Agree with Plan of Care Patient      Patient will benefit from skilled therapeutic intervention in order to improve the following deficits and impairments:  Abnormal gait, Decreased activity tolerance, Decreased balance, Decreased endurance, Difficulty walking, Decreased strength, Decreased cognition, Postural dysfunction, Dizziness  Visit Diagnosis: Repeated falls  Muscle weakness (generalized)  Difficulty in walking, not elsewhere classified     Problem List Patient Active Problem List   Diagnosis Date Noted  . Stone, kidney 08/15/2014  . Right nephrolithiasis 08/15/2014  . Pulmonary nodules 03/20/2014  . Dyspnea 03/19/2014    Kreston Ahrendt 07/21/2015, 5:57 PM  [facility] [address] [place], [place], [address] Phone: [phone]   Fax:  [phone]  Name: ARIONA DESCHENE MRN: [id number] Date of Birth: [birth year]    Melvenia Needles, PTA 07/21/2015 5:57 PM Phone: [phone] Fax: [phone]

## 2015-07-23 ENCOUNTER — Encounter: Payer: PPO | Admitting: Physical Therapy

## 2015-07-25 ENCOUNTER — Ambulatory Visit: Payer: PPO | Admitting: Physical Therapy

## 2015-07-25 DIAGNOSIS — R296 Repeated falls: Secondary | ICD-10-CM

## 2015-07-25 DIAGNOSIS — M6281 Muscle weakness (generalized): Secondary | ICD-10-CM

## 2015-07-25 DIAGNOSIS — R262 Difficulty in walking, not elsewhere classified: Secondary | ICD-10-CM

## 2015-07-25 NOTE — Therapy (Signed)
[place], [place], [address] Phone: [phone]   Fax:  [phone]  Physical Therapy Treatment/Discharge  Patient Details  Name: Caroline Griffith MRN: [id number] Date of Birth: 24-Sep-1930 Referring Provider: Dr Lavone Orn  Encounter Date: 07/25/2015      PT End of Session - 07/25/15 0930    Visit Number 10   Number of Visits 16   Date for PT Re-Evaluation 07/28/15   PT Start Time 0930   PT Stop Time 1019   PT Time Calculation (min) 49 min   Activity Tolerance Patient tolerated treatment well   Behavior During Therapy [facility] for tasks assessed/performed      Past Medical History  Diagnosis Date  . Macular degeneration   . Varicose veins   . Arthritis   . Pneumonia     hx. of  . Chronic kidney disease     kidney stones  . History of hiatal hernia     Past Surgical History  Procedure Laterality Date  . Bunionectomy Right   . Joint replacement Right     Total hip replacement  . Joint replacement Right     Total knee replacement  . Orif wrist fracture Left   . Abdominal hysterectomy    . Tonsillectomy    . Cystoscopy with stent placement Right 08/15/2014    Procedure: CYSTOSCOPY WITH RIGHT STENT PLACEMENT;  Surgeon: [name], MD;  Location: [facility];  Service: Urology;  Laterality: Right;    There were no vitals filed for this visit.      Subjective Assessment - 07/25/15 0931    Subjective No pain today, just general arthritis pain "all over".             [facility] PT Assessment - 07/25/15 0954    Berg Balance Test   Sit to Stand Able to stand without using hands and stabilize independently   Standing Unsupported Able to stand safely 2 minutes   Sitting with Back Unsupported but Feet Supported on Floor or Stool Able to sit safely and securely 2 minutes   Stand to Sit Sits safely with minimal use of hands   Transfers Able to transfer safely, minor use of hands   Standing  Unsupported with Eyes Closed Able to stand 10 seconds with supervision   Standing Ubsupported with Feet Together Able to place feet together independently and stand for 1 minute with supervision   From Standing, Reach Forward with Outstretched Arm Can reach confidently >25 cm (10")   From Standing Position, Pick up Object from [place] to pick up shoe safely and easily   From Standing Position, Turn to Look Behind Over each Shoulder Looks behind from both sides and weight shifts well   Turn 360 Degrees Able to turn 360 degrees safely but slowly   Standing Unsupported, Alternately Place Feet on Step/Stool Able to complete >2 steps/needs minimal assist   Standing Unsupported, One Foot in Front Able to take small step independently and hold 30 seconds   Standing on One Leg Tries to lift leg/unable to hold 3 seconds but remains standing independently   Total Score 44                     [facility] Adult PT Treatment/Exercise - 07/25/15 0939    Self-Care   Other Self-Care Comments  post PT exercise group, POC    Knee/Hip Exercises: Supine   Bridges Limitations x 10    Straight Leg  Raises Strengthening;Both;1 set;10 reps   Knee/Hip Exercises: Sidelying   Hip ABduction Limitations 10 X each hip  NO reib pain with right sidelying.    Knee/Hip Exercises: Prone   Straight Leg Raises Strengthening;Both;1 set;10 reps   Shoulder Exercises: Standing   Horizontal ABduction Strengthening;Both;10 reps;Theraband   Theraband Level (Shoulder Horizontal ABduction) Level 1 (Yellow)   Extension Strengthening;Both;10 reps   Theraband Level (Shoulder Extension) Level 1 (Yellow)   Row Strengthening;Both;10 reps   Theraband Level (Shoulder Row) Level 1 (Yellow)                  PT Short Term Goals - 2015-08-06 1302    PT SHORT TERM GOAL #1   Title Pateint will demonstrate good static standing balance    Status Partially Met   PT SHORT TERM GOAL #2   Title Patient will demsotrate 4+/5  gross bilateral lower extremity strength   Status Partially Met   PT SHORT TERM GOAL #3   Title Patient will be independent with HEP for bilateral lower extremity strengthening   Status Achieved           PT Long Term Goals - 08/06/2015 1302    PT LONG TERM GOAL #1   Title Patient will score > 41 on the BERG balance scale to put her in the low fall risk category    Status Achieved   PT LONG TERM GOAL #2   Title Patient will amb[place] the [place] without fatigue and without falls    Status Achieved   PT LONG TERM GOAL #3   Title Patient will increase dynamic standing balance to good in order to safely perfrom IADL's    Status Achieved               Plan - August 06, 2015 1300    Clinical Impression Statement Patient does not have any obvious pain today.  She has improved her Berg score by 10 points.  She feels ready to be DC and will finish today and return for Group exercise class on Wed. for continued exercise and montiored balance work.  She needs her cane or  walker at all times.  She states her family realizes her memory is declining, will spend a few weeks with her son soon.       Patient will benefit from skilled therapeutic intervention in order to improve the following deficits and impairments:  Abnormal gait, Decreased activity tolerance, Decreased balance, Decreased endurance, Difficulty walking, Decreased strength, Decreased cognition, Postural dysfunction, Dizziness  Visit Diagnosis: Repeated falls  Muscle weakness (generalized)  Difficulty in walking, not elsewhere classified       G-Codes - 06-Aug-2015 1011    Functional Assessment Tool Used BERG balance scale, clinical decision making    Functional Limitation Mobility: Walking and moving around   Mobility: Walking and Moving Around Current Status [phone]) At least 1 percent but less than 20 percent impaired, limited or restricted   Mobility: Walking and Moving Around Goal Status [phone]) At least 1 percent  but less than 20 percent impaired, limited or restricted   Mobility: Walking and Moving Around Discharge Status [phone]) At least 1 percent but less than 20 percent impaired, limited or restricted      Problem List Patient Active Problem List   Diagnosis Date Noted  . Stone, kidney 08/15/2014  . Right nephrolithiasis 08/15/2014  . Pulmonary nodules 03/20/2014  . Dyspnea 03/19/2014    Modell Fendrick 08-06-15, 1:04 PM  [facility]  [place], [place], [address] Phone: [phone]   Fax:  [phone]  Name: Caroline Griffith MRN: [id number] Date of Birth: Sep 01, 1930  PHYSICAL THERAPY DISCHARGE SUMMARY  Visits from Start of Care: 10  Current functional level related to goals / functional outcomes: See goals   Remaining deficits: Balance, strength   Education / Equipment: HEP, balance, cane, posture and body mechanics  Plan: Patient agrees to discharge.  Patient goals were partially met. Patient is being discharged due to being pleased with the current functional level.  ?????      Raeford Razor, PT 07/25/2015 1:04 PM Phone: [phone] Fax: [phone]

## 2015-07-25 NOTE — Patient Instructions (Addendum)
  Low Row: Thumbs Up    Face anchor, medium to wide stance. Thumbs up, pull arms back, squeezing shoulder blades together.  Repeat __ times per set. Do __ sets per session. Do __ sessions per week. Anchor Height: Waist  http://tub.exer.us/68   Copyright  VHI. All rights reserved.  Resisted Horizontal Abduction: Bilateral    Sit or stand, tubing in both hands, arms out in front. Keeping arms straight, pinch shoulder blades together and stretch arms out. Repeat ____ times per set. Do ____ sets per session. Do ____ sessions per day.  http://orth.exer.us/969   Copyright  VHI. All rights reserved.  EXTENSION: Standing - Resistance Band: Stable (Active)    Stand, right arm at side. Against yellow resistance band, draw arm backward, as far as possible, keeping elbow straight. Complete _1-2__ sets of __10-20_ repetitions. Perform _1__ sessions per day.  Copyright  VHI. All rights reserved.

## 2015-07-29 ENCOUNTER — Ambulatory Visit: Payer: PPO | Admitting: Physical Therapy

## 2015-07-31 ENCOUNTER — Encounter: Payer: PPO | Admitting: Physical Therapy

## 2015-08-26 DIAGNOSIS — H353211 Exudative age-related macular degeneration, right eye, with active choroidal neovascularization: Secondary | ICD-10-CM | POA: Diagnosis not present

## 2015-08-26 DIAGNOSIS — H353221 Exudative age-related macular degeneration, left eye, with active choroidal neovascularization: Secondary | ICD-10-CM | POA: Diagnosis not present

## 2015-09-09 DIAGNOSIS — H353221 Exudative age-related macular degeneration, left eye, with active choroidal neovascularization: Secondary | ICD-10-CM | POA: Diagnosis not present

## 2015-09-16 DIAGNOSIS — R0609 Other forms of dyspnea: Secondary | ICD-10-CM | POA: Diagnosis not present

## 2015-09-16 DIAGNOSIS — F419 Anxiety disorder, unspecified: Secondary | ICD-10-CM | POA: Diagnosis not present

## 2015-09-16 DIAGNOSIS — R5383 Other fatigue: Secondary | ICD-10-CM | POA: Diagnosis not present

## 2015-09-16 DIAGNOSIS — R413 Other amnesia: Secondary | ICD-10-CM | POA: Diagnosis not present

## 2015-09-17 ENCOUNTER — Encounter: Payer: Self-pay | Admitting: Podiatry

## 2015-09-17 ENCOUNTER — Ambulatory Visit (INDEPENDENT_AMBULATORY_CARE_PROVIDER_SITE_OTHER): Payer: PPO | Admitting: Podiatry

## 2015-09-17 DIAGNOSIS — L84 Corns and callosities: Secondary | ICD-10-CM

## 2015-09-18 NOTE — Progress Notes (Signed)
Subjective:     Patient ID: Caroline Griffith, female   DOB: 1930/07/04, 82 y.o.   MRN: [id number]  HPI patient presents with calluses plantar aspect both feet   Review of Systems     Objective:   Physical Exam Neurovascular status intact with keratotic lesions bilateral    Assessment:     Lesion formation    Plan:     Debris lesions bilateral with no iatrogenic bleeding noted

## 2015-10-22 DIAGNOSIS — H353221 Exudative age-related macular degeneration, left eye, with active choroidal neovascularization: Secondary | ICD-10-CM | POA: Diagnosis not present

## 2015-10-22 DIAGNOSIS — H353211 Exudative age-related macular degeneration, right eye, with active choroidal neovascularization: Secondary | ICD-10-CM | POA: Diagnosis not present

## 2015-10-23 ENCOUNTER — Ambulatory Visit (INDEPENDENT_AMBULATORY_CARE_PROVIDER_SITE_OTHER): Payer: PPO | Admitting: Podiatry

## 2015-10-23 DIAGNOSIS — L84 Corns and callosities: Secondary | ICD-10-CM

## 2015-10-23 NOTE — Progress Notes (Signed)
Subjective:     Patient ID: Caroline Griffith, female   DOB: [birth year], 82 y.o.   MRN: [id number]  HPI patient presents with lesions on both feet   Review of Systems     Objective:   Physical Exam Neurovascular status intact keratotic lesions bilateral    Assessment:     Chronic lesion formation    Plan:     Debris lesions bilateral with no iatrogenic bleeding noted

## 2015-10-27 DIAGNOSIS — R269 Unspecified abnormalities of gait and mobility: Secondary | ICD-10-CM | POA: Diagnosis not present

## 2015-10-27 DIAGNOSIS — Z1389 Encounter for screening for other disorder: Secondary | ICD-10-CM | POA: Diagnosis not present

## 2015-10-27 DIAGNOSIS — Z0000 Encounter for general adult medical examination without abnormal findings: Secondary | ICD-10-CM | POA: Diagnosis not present

## 2015-11-11 DIAGNOSIS — H353221 Exudative age-related macular degeneration, left eye, with active choroidal neovascularization: Secondary | ICD-10-CM | POA: Diagnosis not present

## 2015-11-11 DIAGNOSIS — H353211 Exudative age-related macular degeneration, right eye, with active choroidal neovascularization: Secondary | ICD-10-CM | POA: Diagnosis not present

## 2015-11-27 ENCOUNTER — Ambulatory Visit (INDEPENDENT_AMBULATORY_CARE_PROVIDER_SITE_OTHER): Payer: PPO | Admitting: Podiatry

## 2015-11-27 DIAGNOSIS — M2041 Other hammer toe(s) (acquired), right foot: Secondary | ICD-10-CM | POA: Diagnosis not present

## 2015-11-27 DIAGNOSIS — M2042 Other hammer toe(s) (acquired), left foot: Secondary | ICD-10-CM

## 2015-11-27 DIAGNOSIS — L84 Corns and callosities: Secondary | ICD-10-CM | POA: Diagnosis not present

## 2015-11-27 NOTE — Progress Notes (Signed)
Subjective:     Patient ID: Caroline Griffith, female   DOB: 1930-07-28, 82 y.o.   MRN: [id number]  HPI patient presents stating that she has these calluses that become very sore and also she is concerned about her hammertoes and whether they could be corrected   Review of Systems     Objective:   Physical Exam Neurovascular status intact with keratotic lesion sub-first metatarsal bilateral that are painful and digital deformities lesser digits right over left with contracture noted    Assessment:     Keratotic lesion sub-first metatarsal bilateral with lesion formation and hammertoe deformity    Plan:     Discussed hammertoes and do not recommend correction due to age and changes in shoe gear which would be better options for her. Debrided lesions on both feet which was tolerated well

## 2015-12-24 DIAGNOSIS — H353211 Exudative age-related macular degeneration, right eye, with active choroidal neovascularization: Secondary | ICD-10-CM | POA: Diagnosis not present

## 2015-12-24 DIAGNOSIS — H353221 Exudative age-related macular degeneration, left eye, with active choroidal neovascularization: Secondary | ICD-10-CM | POA: Diagnosis not present

## 2016-01-09 ENCOUNTER — Ambulatory Visit (INDEPENDENT_AMBULATORY_CARE_PROVIDER_SITE_OTHER): Payer: PPO | Admitting: Podiatry

## 2016-01-09 DIAGNOSIS — L84 Corns and callosities: Secondary | ICD-10-CM

## 2016-01-09 NOTE — Progress Notes (Signed)
Subjective:     Patient ID: Caroline Griffith, female   DOB: 08/05/1930, 82 y.o.   MRN: [id number]  HPI patient presents with lesions underneath the first metatarsal bilateral that are painful   Review of Systems     Objective:   Physical Exam Neurovascular status intact with keratotic lesions    Assessment:     Lesion secondary to pressure    Plan:     Debride lesions bilateral first metatarsal with no iatrogenic bleeding noted

## 2016-01-20 DIAGNOSIS — H353231 Exudative age-related macular degeneration, bilateral, with active choroidal neovascularization: Secondary | ICD-10-CM | POA: Diagnosis not present

## 2016-02-06 DIAGNOSIS — H52203 Unspecified astigmatism, bilateral: Secondary | ICD-10-CM | POA: Diagnosis not present

## 2016-02-06 DIAGNOSIS — Z961 Presence of intraocular lens: Secondary | ICD-10-CM | POA: Diagnosis not present

## 2016-02-06 DIAGNOSIS — H40053 Ocular hypertension, bilateral: Secondary | ICD-10-CM | POA: Diagnosis not present

## 2016-02-06 DIAGNOSIS — H40013 Open angle with borderline findings, low risk, bilateral: Secondary | ICD-10-CM | POA: Diagnosis not present

## 2016-02-20 ENCOUNTER — Ambulatory Visit: Payer: PPO | Admitting: Podiatry

## 2016-02-23 ENCOUNTER — Ambulatory Visit (INDEPENDENT_AMBULATORY_CARE_PROVIDER_SITE_OTHER): Payer: PPO | Admitting: Podiatry

## 2016-02-23 ENCOUNTER — Encounter: Payer: Self-pay | Admitting: Podiatry

## 2016-02-23 DIAGNOSIS — L84 Corns and callosities: Secondary | ICD-10-CM

## 2016-02-24 NOTE — Progress Notes (Signed)
Subjective:     Patient ID: Caroline Griffith, female   DOB: [birth year], 83 y.o.   MRN: [id number]  HPI patient presents with painful lesions on both feet   Review of Systems     Objective:   Physical Exam Neurovascular status intact with keratotic lesions plantar first metatarsal bilateral    Assessment:     Lesion secondary to metatarsal protrusion    Plan:     Debride lesions bilateral with no iatrogenic bleeding reappoint as needed

## 2016-02-25 DIAGNOSIS — H353211 Exudative age-related macular degeneration, right eye, with active choroidal neovascularization: Secondary | ICD-10-CM | POA: Diagnosis not present

## 2016-03-16 DIAGNOSIS — H353221 Exudative age-related macular degeneration, left eye, with active choroidal neovascularization: Secondary | ICD-10-CM | POA: Diagnosis not present

## 2016-03-16 DIAGNOSIS — H353211 Exudative age-related macular degeneration, right eye, with active choroidal neovascularization: Secondary | ICD-10-CM | POA: Diagnosis not present

## 2016-03-22 ENCOUNTER — Ambulatory Visit: Payer: PPO | Admitting: Podiatry

## 2016-04-23 ENCOUNTER — Ambulatory Visit (INDEPENDENT_AMBULATORY_CARE_PROVIDER_SITE_OTHER): Payer: PPO | Admitting: Podiatry

## 2016-04-23 DIAGNOSIS — L400 Psoriasis vulgaris: Secondary | ICD-10-CM | POA: Diagnosis not present

## 2016-04-23 DIAGNOSIS — L84 Corns and callosities: Secondary | ICD-10-CM

## 2016-04-23 DIAGNOSIS — C4441 Basal cell carcinoma of skin of scalp and neck: Secondary | ICD-10-CM | POA: Diagnosis not present

## 2016-04-23 DIAGNOSIS — C44519 Basal cell carcinoma of skin of other part of trunk: Secondary | ICD-10-CM | POA: Diagnosis not present

## 2016-04-23 DIAGNOSIS — D485 Neoplasm of uncertain behavior of skin: Secondary | ICD-10-CM | POA: Diagnosis not present

## 2016-04-23 DIAGNOSIS — Z85828 Personal history of other malignant neoplasm of skin: Secondary | ICD-10-CM | POA: Diagnosis not present

## 2016-04-23 DIAGNOSIS — C44319 Basal cell carcinoma of skin of other parts of face: Secondary | ICD-10-CM | POA: Diagnosis not present

## 2016-04-23 NOTE — Progress Notes (Addendum)
Subjective:     Patient ID: Caroline Griffith, female   DOB: [birth year], 83 y.o.   MRN: [id number]  HPI patient states the lesions are very sore on both feet and she cannot walk the bone was exposed and she's only getting about 4 weeks of relief   Review of Systems     Objective:   Physical Exam Neurovascular status intact with patient found to have significant plantar flexion first metatarsal segment bilateral with keratotic lesion and no fat tissue exposure    Assessment:     Chronic lesions with no fat to protect with plantarflexed metatarsal bilateral    Plan:     H&P condition reviewed and debridement accomplished of the lesions and I then discussed a super softer orthotic with depressions around the first metatarsal bilateral. She will Cipro for this for casting and we will go with a super soft type orthotic with p cell material

## 2016-05-03 ENCOUNTER — Other Ambulatory Visit: Payer: PPO

## 2016-05-03 DIAGNOSIS — L84 Corns and callosities: Secondary | ICD-10-CM | POA: Diagnosis not present

## 2016-05-05 DIAGNOSIS — H353221 Exudative age-related macular degeneration, left eye, with active choroidal neovascularization: Secondary | ICD-10-CM | POA: Diagnosis not present

## 2016-05-05 DIAGNOSIS — H353211 Exudative age-related macular degeneration, right eye, with active choroidal neovascularization: Secondary | ICD-10-CM | POA: Diagnosis not present

## 2016-05-18 DIAGNOSIS — H353231 Exudative age-related macular degeneration, bilateral, with active choroidal neovascularization: Secondary | ICD-10-CM | POA: Diagnosis not present

## 2016-05-20 DIAGNOSIS — C44519 Basal cell carcinoma of skin of other part of trunk: Secondary | ICD-10-CM | POA: Diagnosis not present

## 2016-05-20 DIAGNOSIS — Z85828 Personal history of other malignant neoplasm of skin: Secondary | ICD-10-CM | POA: Diagnosis not present

## 2016-05-25 ENCOUNTER — Other Ambulatory Visit: Payer: PPO

## 2016-05-31 ENCOUNTER — Other Ambulatory Visit: Payer: PPO

## 2016-06-15 DIAGNOSIS — R350 Frequency of micturition: Secondary | ICD-10-CM | POA: Diagnosis not present

## 2016-06-15 DIAGNOSIS — Z87442 Personal history of urinary calculi: Secondary | ICD-10-CM | POA: Diagnosis not present

## 2016-07-13 DIAGNOSIS — H353211 Exudative age-related macular degeneration, right eye, with active choroidal neovascularization: Secondary | ICD-10-CM | POA: Diagnosis not present

## 2016-07-13 DIAGNOSIS — H353221 Exudative age-related macular degeneration, left eye, with active choroidal neovascularization: Secondary | ICD-10-CM | POA: Diagnosis not present

## 2016-07-20 DIAGNOSIS — H353211 Exudative age-related macular degeneration, right eye, with active choroidal neovascularization: Secondary | ICD-10-CM | POA: Diagnosis not present

## 2016-07-20 DIAGNOSIS — H353221 Exudative age-related macular degeneration, left eye, with active choroidal neovascularization: Secondary | ICD-10-CM | POA: Diagnosis not present

## 2016-07-26 ENCOUNTER — Ambulatory Visit (INDEPENDENT_AMBULATORY_CARE_PROVIDER_SITE_OTHER): Payer: PPO | Admitting: Podiatry

## 2016-07-26 DIAGNOSIS — Q828 Other specified congenital malformations of skin: Secondary | ICD-10-CM

## 2016-08-02 NOTE — Progress Notes (Signed)
   Subjective: Patient presents to the office today for chief complaint of painful callus lesions of the feet. Patient states that the pain is ongoing and is affecting their ability to ambulate without pain. Patient presents today for further treatment and evaluation.  Objective:  Physical Exam General: Alert and oriented x3 in no acute distress  Dermatology: Hyperkeratotic lesion present on the bilateral feet 2. Pain on palpation with a central nucleated core noted.  Skin is warm, dry and supple bilateral lower extremities. Negative for open lesions or macerations.  Vascular: Palpable pedal pulses bilaterally. No edema or erythema noted. Capillary refill within normal limits.  Neurological: Epicritic and protective threshold grossly intact bilaterally.   Musculoskeletal Exam: Pain on palpation at the keratotic lesion noted. Range of motion within normal limits bilateral. Muscle strength 5/5 in all groups bilateral.  Assessment: #1 porokeratosis bilateral feet 2   Plan of Care:  #1 Patient evaluated #2 Excisional debridement of keratoic lesion using a chisel blade was performed without incident.  #3 Treated area(s) with Salinocaine and dressed with light dressing. #4 Patient is to return to the clinic PRN.   Edrick Kins, DPM [facility]  Dr. Edrick Kins, [address]                                        [place], [place] [address]                Office [phone]  Fax [phone]

## 2016-08-09 DIAGNOSIS — H40013 Open angle with borderline findings, low risk, bilateral: Secondary | ICD-10-CM | POA: Diagnosis not present

## 2016-08-09 DIAGNOSIS — H40033 Anatomical narrow angle, bilateral: Secondary | ICD-10-CM | POA: Diagnosis not present

## 2016-09-20 ENCOUNTER — Ambulatory Visit (INDEPENDENT_AMBULATORY_CARE_PROVIDER_SITE_OTHER): Payer: PPO | Admitting: Podiatry

## 2016-09-20 ENCOUNTER — Encounter: Payer: Self-pay | Admitting: Podiatry

## 2016-09-20 DIAGNOSIS — Q828 Other specified congenital malformations of skin: Secondary | ICD-10-CM | POA: Diagnosis not present

## 2016-09-22 DIAGNOSIS — H353221 Exudative age-related macular degeneration, left eye, with active choroidal neovascularization: Secondary | ICD-10-CM | POA: Diagnosis not present

## 2016-09-24 NOTE — Progress Notes (Signed)
   Subjective: Patient presents to the office today for chief complaint of painful callus lesions of the feet. Patient states that the pain is ongoing and is affecting their ability to ambulate without pain. Patient presents today for further treatment and evaluation.  Objective:  Physical Exam General: Alert and oriented x3 in no acute distress  Dermatology: Hyperkeratotic lesion present on the bilateral feet 2. Pain on palpation with a central nucleated core noted.  Skin is warm, dry and supple bilateral lower extremities. Negative for open lesions or macerations.  Vascular: Palpable pedal pulses bilaterally. No edema or erythema noted. Capillary refill within normal limits.  Neurological: Epicritic and protective threshold grossly intact bilaterally.   Musculoskeletal Exam: Pain on palpation at the keratotic lesion noted. Range of motion within normal limits bilateral. Muscle strength 5/5 in all groups bilateral.  Assessment: #1 porokeratosis bilateral feet 2   Plan of Care:  #1 Patient evaluated #2 Excisional debridement of keratoic lesion using a chisel blade was performed without incident.  #3 Treated area(s) with Salinocaine and dressed with light dressing. #4 today we're going to set up an appointment with Liliane Channel, Pedorthist to modify the patient's orthotics and allow for offloading of the first MPJs bilateral  #5 Patient is to return to the clinic PRN.   Edrick Kins, DPM [facility]  Dr. Edrick Kins, [address]                                        [place], [place] [address]                Office [phone]  Fax [phone]

## 2016-09-29 DIAGNOSIS — H353211 Exudative age-related macular degeneration, right eye, with active choroidal neovascularization: Secondary | ICD-10-CM | POA: Diagnosis not present

## 2016-10-01 ENCOUNTER — Inpatient Hospital Stay (HOSPITAL_COMMUNITY)
Admission: EM | Admit: 2016-10-01 | Discharge: 2016-10-04 | DRG: 872 | Disposition: A | Discharge status: Home Health | Payer: PPO | Attending: Internal Medicine | Admitting: Internal Medicine

## 2016-10-01 ENCOUNTER — Emergency Department (HOSPITAL_COMMUNITY): Payer: PPO

## 2016-10-01 ENCOUNTER — Encounter (HOSPITAL_COMMUNITY): Payer: Self-pay | Admitting: Emergency Medicine

## 2016-10-01 DIAGNOSIS — H3530 Unspecified macular degeneration: Secondary | ICD-10-CM | POA: Diagnosis present

## 2016-10-01 DIAGNOSIS — R1031 Right lower quadrant pain: Secondary | ICD-10-CM | POA: Diagnosis not present

## 2016-10-01 DIAGNOSIS — Z66 Do not resuscitate: Secondary | ICD-10-CM | POA: Diagnosis not present

## 2016-10-01 DIAGNOSIS — Z79899 Other long term (current) drug therapy: Secondary | ICD-10-CM | POA: Diagnosis not present

## 2016-10-01 DIAGNOSIS — N201 Calculus of ureter: Secondary | ICD-10-CM | POA: Diagnosis not present

## 2016-10-01 DIAGNOSIS — N289 Disorder of kidney and ureter, unspecified: Secondary | ICD-10-CM | POA: Diagnosis not present

## 2016-10-01 DIAGNOSIS — Z23 Encounter for immunization: Secondary | ICD-10-CM

## 2016-10-01 DIAGNOSIS — Z87891 Personal history of nicotine dependence: Secondary | ICD-10-CM

## 2016-10-01 DIAGNOSIS — N184 Chronic kidney disease, stage 4 (severe): Secondary | ICD-10-CM | POA: Diagnosis present

## 2016-10-01 DIAGNOSIS — Z96641 Presence of right artificial hip joint: Secondary | ICD-10-CM | POA: Diagnosis not present

## 2016-10-01 DIAGNOSIS — Z466 Encounter for fitting and adjustment of urinary device: Secondary | ICD-10-CM | POA: Diagnosis not present

## 2016-10-01 DIAGNOSIS — R918 Other nonspecific abnormal finding of lung field: Secondary | ICD-10-CM | POA: Diagnosis not present

## 2016-10-01 DIAGNOSIS — F419 Anxiety disorder, unspecified: Secondary | ICD-10-CM | POA: Diagnosis present

## 2016-10-01 DIAGNOSIS — A419 Sepsis, unspecified organism: Secondary | ICD-10-CM | POA: Diagnosis not present

## 2016-10-01 DIAGNOSIS — G43A1 Cyclical vomiting, intractable: Secondary | ICD-10-CM | POA: Diagnosis not present

## 2016-10-01 DIAGNOSIS — K5730 Diverticulosis of large intestine without perforation or abscess without bleeding: Secondary | ICD-10-CM | POA: Diagnosis present

## 2016-10-01 DIAGNOSIS — N390 Urinary tract infection, site not specified: Secondary | ICD-10-CM

## 2016-10-01 DIAGNOSIS — Z96651 Presence of right artificial knee joint: Secondary | ICD-10-CM | POA: Diagnosis not present

## 2016-10-01 DIAGNOSIS — R627 Adult failure to thrive: Secondary | ICD-10-CM | POA: Diagnosis present

## 2016-10-01 DIAGNOSIS — K8020 Calculus of gallbladder without cholecystitis without obstruction: Secondary | ICD-10-CM | POA: Diagnosis not present

## 2016-10-01 DIAGNOSIS — B9620 Unspecified Escherichia coli [E. coli] as the cause of diseases classified elsewhere: Secondary | ICD-10-CM | POA: Diagnosis present

## 2016-10-01 DIAGNOSIS — N200 Calculus of kidney: Secondary | ICD-10-CM | POA: Diagnosis not present

## 2016-10-01 DIAGNOSIS — N179 Acute kidney failure, unspecified: Secondary | ICD-10-CM | POA: Diagnosis not present

## 2016-10-01 DIAGNOSIS — R109 Unspecified abdominal pain: Secondary | ICD-10-CM | POA: Diagnosis present

## 2016-10-01 DIAGNOSIS — R0600 Dyspnea, unspecified: Secondary | ICD-10-CM

## 2016-10-01 DIAGNOSIS — N136 Pyonephrosis: Secondary | ICD-10-CM | POA: Diagnosis present

## 2016-10-01 DIAGNOSIS — N132 Hydronephrosis with renal and ureteral calculous obstruction: Secondary | ICD-10-CM | POA: Diagnosis not present

## 2016-10-01 DIAGNOSIS — N2889 Other specified disorders of kidney and ureter: Secondary | ICD-10-CM | POA: Diagnosis not present

## 2016-10-01 DIAGNOSIS — R6889 Other general symptoms and signs: Secondary | ICD-10-CM | POA: Diagnosis not present

## 2016-10-01 HISTORY — DX: Urinary calculus, unspecified: N20.9

## 2016-10-01 LAB — URINALYSIS, ROUTINE W REFLEX MICROSCOPIC
Bilirubin Urine: NEGATIVE
GLUCOSE, UA: NEGATIVE mg/dL
KETONES UR: 5 mg/dL — AB
NITRITE: NEGATIVE
PROTEIN: NEGATIVE mg/dL
Specific Gravity, Urine: 1.014 (ref 1.005–1.030)
pH: 5 (ref 5.0–8.0)

## 2016-10-01 LAB — COMPREHENSIVE METABOLIC PANEL
ALT: 15 U/L (ref 14–54)
AST: 31 U/L (ref 15–41)
Albumin: 4 g/dL (ref 3.5–5.0)
Alkaline Phosphatase: 108 U/L (ref 38–126)
Anion gap: 8 (ref 5–15)
BILIRUBIN TOTAL: 1.1 mg/dL (ref 0.3–1.2)
BUN: 17 mg/dL (ref 6–20)
CALCIUM: 9.3 mg/dL (ref 8.9–10.3)
CO2: 26 mmol/L (ref 22–32)
Chloride: 107 mmol/L (ref 101–111)
Creatinine, Ser: 1.57 mg/dL — ABNORMAL HIGH (ref 0.44–1.00)
GFR calc Af Amer: 34 mL/min — ABNORMAL LOW (ref >60)
GFR, EST NON AFRICAN AMERICAN: 29 mL/min — AB (ref >60)
GLUCOSE: 105 mg/dL — AB (ref 65–99)
Potassium: 3.9 mmol/L (ref 3.5–5.1)
Sodium: 141 mmol/L (ref 135–145)
Total Protein: 6.9 g/dL (ref 6.5–8.1)

## 2016-10-01 LAB — CBC
HCT: 42 % (ref 36.0–46.0)
HEMOGLOBIN: 13.9 g/dL (ref 12.0–15.0)
MCH: 31 pg (ref 26.0–34.0)
MCHC: 33.1 g/dL (ref 30.0–36.0)
MCV: 93.5 fL (ref 78.0–100.0)
Platelets: 239 10*3/uL (ref 150–400)
RBC: 4.49 MIL/uL (ref 3.87–5.11)
RDW: 13.4 % (ref 11.5–15.5)
WBC: 8.1 10*3/uL (ref 4.0–10.5)

## 2016-10-01 LAB — LIPASE, BLOOD: Lipase: 20 U/L (ref 11–51)

## 2016-10-01 MED ORDER — SODIUM CHLORIDE 0.9 % IV BOLUS (SEPSIS): 1000.0000 mL | Freq: Once | INTRAVENOUS | Status: DC

## 2016-10-01 MED ORDER — SODIUM CHLORIDE 0.9 % IV BOLUS (SEPSIS)
500.0000 mL | Freq: Once | INTRAVENOUS | Status: AC
  Administered 2016-10-01: 500 mL via INTRAVENOUS

## 2016-10-01 MED ORDER — TERAZOSIN HCL 1 MG PO CAPS
1.0000 mg | ORAL_CAPSULE | Freq: Every day | ORAL | Status: DC
  Administered 2016-10-03: 1 mg via ORAL
  Filled 2016-10-01 (×2): qty 1

## 2016-10-01 MED ORDER — DEXTROSE 5 % IV SOLN
1.0000 g | Freq: Once | INTRAVENOUS | Status: AC
  Administered 2016-10-01: 1 g via INTRAVENOUS
  Filled 2016-10-01: qty 10

## 2016-10-01 MED ORDER — ONDANSETRON HCL 4 MG/2ML IJ SOLN
4.0000 mg | Freq: Once | INTRAMUSCULAR | Status: AC
  Administered 2016-10-01: 4 mg via INTRAVENOUS
  Filled 2016-10-01: qty 2

## 2016-10-01 MED ORDER — MORPHINE SULFATE (PF) 2 MG/ML IV SOLN
2.0000 mg | Freq: Once | INTRAVENOUS | Status: AC
  Administered 2016-10-01: 2 mg via INTRAVENOUS
  Filled 2016-10-01: qty 1

## 2016-10-01 MED ORDER — MORPHINE SULFATE (PF) 4 MG/ML IV SOLN
4.0000 mg | Freq: Once | INTRAVENOUS | Status: AC
  Administered 2016-10-01: 4 mg via INTRAVENOUS
  Filled 2016-10-01: qty 1

## 2016-10-01 NOTE — ED Triage Notes (Signed)
Per EMS-states she was at PCP office complaining of abdominal discomfort N/V which started this am-BP 130/58-patient requesting pain meds at MD office-sent here for further eval-no N/V in route

## 2016-10-01 NOTE — ED Notes (Signed)
Pt denies N/V. She states that she has not experienced either. She endorses a sharp pain in her RLQ that radiated through to her back.

## 2016-10-01 NOTE — ED Provider Notes (Signed)
[facility] DEPT Provider Note   CSN: [id number] Arrival date & time: 10/01/16  1437     History   Chief Complaint Chief Complaint  Patient presents with  . Abdominal Pain    RLQ  . Back Pain    HPI Caroline Griffith is a 83 y.o. female.  The history is provided by the patient.  Abdominal Pain   This is a new problem. The current episode started 6 to 12 hours ago. The problem occurs constantly. The problem has been gradually improving. The pain is associated with an unknown factor. The pain is located in the RLQ. The pain is moderate. Associated symptoms include constipation. Pertinent negatives include anorexia, fever, diarrhea, hematochezia, melena, nausea, vomiting and frequency. The symptoms are aggravated by palpation. Nothing relieves the symptoms.   83 year old female who presents with abdominal pain. She has a history of nephrolithiasis and abdominal hysterectomy. States onset of right-sided abdominal pain starting at 6 AM this morning. Pain has been constant, but now radiating in her low back. Denies any nausea, vomiting, diarrhea, dysuria, urinary frequency, hematuria. States she does feel constipated for one day. Denies any fevers or chills. Does not recall if she's had similar pain in the past. Has not tried any treatments for her symptoms Past Medical History:  Diagnosis Date  . Arthritis   . Chronic kidney disease    kidney stones  . History of hiatal hernia   . Macular degeneration   . Pneumonia    hx. of  . Varicose veins     Patient Active Problem List   Diagnosis Date Noted  . Stone, kidney 08/15/2014  . Right nephrolithiasis 08/15/2014  . Pulmonary nodules 03/20/2014  . Dyspnea 03/19/2014    Past Surgical History:  Procedure Laterality Date  . ABDOMINAL HYSTERECTOMY    . BUNIONECTOMY Right   . CYSTOSCOPY WITH STENT PLACEMENT Right 08/15/2014   Procedure: CYSTOSCOPY WITH RIGHT STENT PLACEMENT;  Surgeon: [name], MD;  Location: [facility] ORS;   Service: Urology;  Laterality: Right;  . JOINT REPLACEMENT Right    Total hip replacement  . JOINT REPLACEMENT Right    Total knee replacement  . ORIF WRIST FRACTURE Left   . TONSILLECTOMY      OB History    No data available       Home Medications    Prior to Admission medications   Medication Sig Start Date End Date Taking? Authorizing Provider  Calcium Carbonate-Vitamin D (CALTRATE 600+D) 600-400 MG-UNIT per tablet Take 1 tablet by mouth daily.    Yes [provider]  latanoprost (XALATAN) 0.005 % ophthalmic solution PLACE 1 DROP INTO BOTH EYES ONCE DAILY 08/28/16  Yes [provider]  LORazepam (ATIVAN) 0.5 MG tablet Take 0.5 mg by mouth 2 (two) times daily as needed for anxiety.   Yes [provider]  Multiple Vitamins-Minerals (PRESERVISION AREDS 2) CAPS Take 1 capsule by mouth 2 (two) times daily.   Yes [provider]  PREVIDENT 5000 BOOSTER PLUS 1.1 % PSTE BRUSH AFTER MEALS UTD. DO NOT RINSE 09/01/16  Yes [provider]  traMADol (ULTRAM) 50 MG tablet Take 50 mg by mouth every 6 (six) hours as needed for moderate pain or severe pain.  10/01/16  Yes [provider]  loperamide (IMODIUM) 2 MG capsule Take 2 mg by mouth as needed for diarrhea or loose stools.    [provider]    Family History Family History  Problem Relation Age of Onset  . Heart  disease Mother   . Asthma Mother   . Emphysema Mother        smoked  . Heart disease Father     Social History Social History  Substance Use Topics  . Smoking status: Former Smoker    Packs/day: 0.25    Years: 14.00    Types: Cigarettes    Quit date: 01/12/1963  . Smokeless tobacco: Never Used  . Alcohol use 0.0 oz/week     Comment: one drink a night     Allergies   Sulfa antibiotics   Review of Systems Review of Systems  Constitutional: Negative for fever.  Respiratory: Negative for shortness of breath.   Gastrointestinal: Positive for  constipation. Negative for anorexia, diarrhea, hematochezia, melena, nausea and vomiting.  Genitourinary: Negative for frequency.  All other systems reviewed and are negative.    Physical Exam Updated Vital Signs BP (!) 97/57 (BP Location: Right Arm)   Pulse 98   Temp 99.3 F (37.4 C) (Oral)   Resp 16   SpO2 96%   Physical Exam Physical Exam  Nursing note and vitals reviewed. Constitutional: Well developed, well nourished, non-toxic, and in no acute distress Head: Normocephalic and atraumatic.  Mouth/Throat: Oropharynx is clear and moist.  Neck: Normal range of motion. Neck supple.  Cardiovascular: Normal rate and regular rhythm.   Pulmonary/Chest: Effort normal and breath sounds normal.  Abdominal: Soft. There is mild right lower quadrant tenderness. There is no rebound and no guarding.  no significant CVA tenderness  Musculoskeletal: Normal range of motion.  Neurological: Alert, no facial droop, fluent speech, moves all extremities symmetrically Skin: Skin is warm and dry.  Psychiatric: Cooperative   ED Treatments / Results  Labs (all labs ordered are listed, but only abnormal results are displayed) Labs Reviewed  COMPREHENSIVE METABOLIC PANEL - Abnormal; Notable for the following:       Result Value   Glucose, Bld 105 (*)    Creatinine, Ser 1.57 (*)    GFR calc non Af Amer 29 (*)    GFR calc Af Amer 34 (*)    All other components within normal limits  URINALYSIS, ROUTINE W REFLEX MICROSCOPIC - Abnormal; Notable for the following:    APPearance CLOUDY (*)    Hgb urine dipstick MODERATE (*)    Ketones, ur 5 (*)    Leukocytes, UA LARGE (*)    Bacteria, UA MANY (*)    Squamous Epithelial / LPF 6-30 (*)    All other components within normal limits  URINE CULTURE  LIPASE, BLOOD  CBC    EKG  EKG Interpretation None       Radiology Ct Abdomen Pelvis Wo Contrast  Result Date: 10/01/2016 CLINICAL DATA:  Acute onset right abdominal pain at 6 a.m. this morning.  The pain now radiates into the lower back. Constipation for 1 day. EXAM: CT ABDOMEN AND PELVIS WITHOUT CONTRAST TECHNIQUE: Multidetector CT imaging of the abdomen and pelvis was performed following the standard protocol without IV contrast. COMPARISON:  10/19/2013. FINDINGS: Lower chest: Again demonstrated is a large hiatal hernia containing fat and stomach. 5 mm left lower lobe nodule on image number 9 of series 4 without significant change. Additional previously demonstrated bilateral lung nodules are not currently included. Hepatobiliary: 2 adjacent 4 mm gallstones in the dependent portion of the gallbladder or single 6 mm stone. No gallbladder wall thickening or pericholecystic fluid. Unremarkable liver. Pancreas: Unremarkable. No pancreatic ductal dilatation or surrounding inflammatory changes. Spleen: Normal in size without focal abnormality.  Adrenals/Urinary Tract: Moderate dilatation of the right renal collecting system to the level of an 8 mm UPJ calculus. Associated diffuse enlargement of the right kidney with right perinephric soft tissue stranding. Moderately atrophied left kidney. No additional urinary tract calculi or hydronephrosis. Normal appearing adrenal glands. Stomach/Bowel: Large number of colonic diverticula without evidence diverticulitis. No evidence of appendicitis. Unremarkable small bowel. Vascular/Lymphatic: Atheromatous arterial calcifications without aneurysm. No enlarged lymph nodes. Reproductive: Status post hysterectomy. No adnexal masses. Other: No abdominal wall hernia or abnormality. No abdominopelvic ascites. Musculoskeletal: Right hip prosthesis. Lumbar and lower thoracic spine degenerative changes. These include marked disc space narrowing and vacuum phenomenon at the L2-3 and L4-5 levels and moderate disc space narrowing at the L5-S1 level. There are also facet degenerative changes with associated grade 1 retrolisthesis at the L2-3 and L3-4 levels and grade 1 anterolisthesis  at the L4-5 and L5-S1 levels. IMPRESSION: 1. 8 mm right UPJ calculus causing moderate right hydronephrosis. 2. Extensive colonic diverticulosis. 3. Cholelithiasis. 4. Moderate left renal atrophy. Electronically Signed   By: Claudie Revering M.D.   On: 10/01/2016 21:01    Procedures Procedures (including critical care time)  Medications Ordered in ED Medications  morphine 4 MG/ML injection 4 mg (not administered)  sodium chloride 0.9 % bolus 500 mL (not administered)  sodium chloride 0.9 % bolus 500 mL (0 mLs Intravenous Stopped 10/01/16 2105)  morphine 2 MG/ML injection 2 mg (2 mg Intravenous Given 10/01/16 1901)  ondansetron (ZOFRAN) injection 4 mg (4 mg Intravenous Given 10/01/16 1901)  cefTRIAXone (ROCEPHIN) 1 g in dextrose 5 % 50 mL IVPB (0 g Intravenous Stopped 10/01/16 2149)     Initial Impression / Assessment and Plan / ED Course  I have reviewed the triage vital signs and the nursing notes.  Pertinent labs & imaging results that were available during my care of the patient were reviewed by me and considered in my medical decision making (see chart for details).     Right sided abdominal pain now towards the low back. Afebrile, mildly tachycardic but normotensive. Abdomen soft and non-peritoneal. Differential includes urolithiasis, pyelonephritis, appendicitis. Will obtain basic blood work, UA, and CT abd/pelvis.    Afebrile, hemodynamically stable. Nontoxic. Blood work without leukocytosis, in mild acute kidney injury. UA suggestive of blood as well as possible UTI but there are some squamous cells. A CT abdomen and pelvis are visualized and shows 8 mm right UPJ stone. Pain better controlled with morphine but does become recurrent later on during ED stay. Discussed with Dr. [name] regarding possible infected kidney stone. Given ceftrriaxone  He recommended observation overnight on the hospitalist service and is becomes febrile or ill may require more urgent stent placement. Discussed with  patient, who is comfortable with admission. Accepted by Dr. [name] from hospitalist service.   Final Clinical Impressions(s) / ED Diagnoses   Final diagnoses:  Lower urinary tract infectious disease  Calculus of ureter    New Prescriptions New Prescriptions   No medications on file     Forde Dandy, MD 10/01/16 2158

## 2016-10-01 NOTE — H&P (Addendum)
TRH H&P   Patient Demographics:    Caroline Griffith, is a 83 y.o. female  MRN: [id number]   DOB - May 12, 1930  Admit Date - 10/01/2016  Outpatient Primary MD for the patient is Lavone Orn, MD  Referring MD/NP/PA: Dr. Oleta Mouse  Outpatient Specialists:  [facility] (podiatry) [facility] Patient coming from:  home  Chief Complaint  Patient presents with  . Abdominal Pain    RLQ  . Back Pain      HPI:    Caroline Griffith  is a 83 y.o. female, w history of nephrolithiasis w  ESL of large right pelvic stone s/p cyst/stent 08/15/2014 [name]), apparently c/o R sided pain beginning this am.  Denies fever, chills, n/v, dysuria, hematuria.  Pt present to ED due to pain.   In ED,  CT scan abd/ pelvis=>  74mm right UPJ calculus causing moderate right hydronephrolsis , cholelithiasis, diverticulosis, moderate L renal atrophy.  Bun/creat 17/1.57,  Prior creatinine 0.99.  Wbc 8.1, Hgb 13.9, Plt 239.  ua wbc TNTC, RBC 6-30.    Pt will be admitted for right sided pain secondary to nephrolithiasis and mild renal insufficiency, and UTI.     Review of systems:    In addition to the HPI above,  No Fever-chills, No Headache, No changes with Vision or hearing, No problems swallowing food or Liquids, No Chest pain, Cough or Shortness of Breath, No Abdominal pain, No Nausea or Vommitting, Bowel movements are regular, No Blood in stool or Urine, No dysuria, No new skin rashes or bruises, No new joints pains-aches,  No new weakness, tingling, numbness in any extremity, No recent weight gain or loss, No polyuria, polydypsia or polyphagia, No significant Mental Stressors.  A full 10 point Review of Systems was done, except as stated above, all other Review of Systems were negative.   With Past History of the following :    Past Medical History:  Diagnosis Date  . Arthritis   .  Chronic kidney disease    kidney stones  . History of hiatal hernia   . Macular degeneration   . Pneumonia    hx. of  . Varicose veins       Past Surgical History:  Procedure Laterality Date  . ABDOMINAL HYSTERECTOMY    . BUNIONECTOMY Right   . CYSTOSCOPY WITH STENT PLACEMENT Right 08/15/2014   Procedure: CYSTOSCOPY WITH RIGHT STENT PLACEMENT;  Surgeon: [name], MD;  Location: [facility];  Service: Urology;  Laterality: Right;  . JOINT REPLACEMENT Right    Total hip replacement  . JOINT REPLACEMENT Right    Total knee replacement  . ORIF WRIST FRACTURE Left   . TONSILLECTOMY        Social History:     Social History  Substance Use Topics  . Smoking status: Former Smoker    Packs/day: 0.25    Years: 14.00  Types: Cigarettes    Quit date: 01/12/1963  . Smokeless tobacco: Never Used  . Alcohol use 0.0 oz/week     Comment: one drink a night     Lives - at home  Mobility - walks by self   Family History :     Family History  Problem Relation Age of Onset  . Heart disease Mother   . Asthma Mother   . Emphysema Mother        smoked  . Heart disease Father       Home Medications:   Prior to Admission medications   Medication Sig Start Date End Date Taking? Authorizing Provider  Calcium Carbonate-Vitamin D (CALTRATE 600+D) 600-400 MG-UNIT per tablet Take 1 tablet by mouth daily.    Yes [provider]  latanoprost (XALATAN) 0.005 % ophthalmic solution PLACE 1 DROP INTO BOTH EYES ONCE DAILY 08/28/16  Yes [provider]  LORazepam (ATIVAN) 0.5 MG tablet Take 0.5 mg by mouth 2 (two) times daily as needed for anxiety.   Yes [provider]  Multiple Vitamins-Minerals (PRESERVISION AREDS 2) CAPS Take 1 capsule by mouth 2 (two) times daily.   Yes [provider]  PREVIDENT 5000 BOOSTER PLUS 1.1 % PSTE BRUSH AFTER MEALS UTD. DO NOT RINSE 09/01/16  Yes [provider]  traMADol (ULTRAM) 50 MG tablet Take 50 mg by mouth  every 6 (six) hours as needed for moderate pain or severe pain.  10/01/16  Yes [provider]  loperamide (IMODIUM) 2 MG capsule Take 2 mg by mouth as needed for diarrhea or loose stools.    [provider]     Allergies:     Allergies  Allergen Reactions  . Sulfa Antibiotics     Unknown      Physical Exam:   Vitals  Blood pressure (!) 97/57, pulse 98, temperature 99.3 F (37.4 C), temperature source Oral, resp. rate 16, SpO2 96 %.   1. General  lying in bed in NAD,   2. Normal affect and insight, Not Suicidal or Homicidal, Awake Alert, Oriented X 3.  3. No F.N deficits, ALL C.Nerves Intact, Strength 5/5 all 4 extremities, Sensation intact all 4 extremities, Plantars down going.  4. Ears and Eyes appear Normal, Conjunctivae clear, PERRLA. Moist Oral Mucosa.  5. Supple Neck, No JVD, No cervical lymphadenopathy appriciated, No Carotid Bruits.  6. Symmetrical Chest wall movement, Good air movement bilaterally, CTAB.  7. RRR, No Gallops, Rubs or Murmurs, No Parasternal Heave.  8. Positive Bowel Sounds, Abdomen Soft, No tenderness, No organomegaly appriciated,No rebound -guarding or rigidity.  9.  No Cyanosis, Normal Skin Turgor, No Skin Rash or Bruise.  10. Good muscle tone,  joints appear normal , no effusions, Normal ROM.  11. No Palpable Lymph Nodes in Neck or Axillae  No CVA tenderness   Data Review:    CBC  Recent Labs Lab 10/01/16 1652  WBC 8.1  HGB 13.9  HCT 42.0  PLT 239  MCV 93.5  MCH 31.0  MCHC 33.1  RDW 13.4   ------------------------------------------------------------------------------------------------------------------  Chemistries   Recent Labs Lab 10/01/16 1652  NA 141  K 3.9  CL 107  CO2 26  GLUCOSE 105*  BUN 17  CREATININE 1.57*  CALCIUM 9.3  AST 31  ALT 15  ALKPHOS 108  BILITOT 1.1   ------------------------------------------------------------------------------------------------------------------ CrCl  cannot be calculated (Unknown ideal weight.). ------------------------------------------------------------------------------------------------------------------ No results for input(s): TSH, T4TOTAL, T3FREE, THYROIDAB in the last 72 hours.  Invalid input(s): FREET3  Coagulation profile No results for input(s): INR, PROTIME in the last 168 hours. ------------------------------------------------------------------------------------------------------------------- No results for input(s): DDIMER in the last 72 hours. -------------------------------------------------------------------------------------------------------------------  Cardiac Enzymes No results for input(s): CKMB, TROPONINI, MYOGLOBIN in the last 168 hours.  Invalid input(s): CK ------------------------------------------------------------------------------------------------------------------ No results found for: BNP   ---------------------------------------------------------------------------------------------------------------  Urinalysis    Component Value Date/Time   COLORURINE YELLOW 10/01/2016 1459   APPEARANCEUR CLOUDY (A) 10/01/2016 1459   LABSPEC 1.014 10/01/2016 1459   PHURINE 5.0 10/01/2016 1459   GLUCOSEU NEGATIVE 10/01/2016 1459   HGBUR MODERATE (A) 10/01/2016 1459   BILIRUBINUR NEGATIVE 10/01/2016 1459   KETONESUR 5 (A) 10/01/2016 1459   PROTEINUR NEGATIVE 10/01/2016 1459   NITRITE NEGATIVE 10/01/2016 1459   LEUKOCYTESUR LARGE (A) 10/01/2016 1459    ----------------------------------------------------------------------------------------------------------------   Imaging Results:    Ct Abdomen Pelvis Wo Contrast  Result Date: 10/01/2016 CLINICAL DATA:  Acute onset right abdominal pain at 6 a.m. this morning. The pain now radiates into the lower back. Constipation for 1 day. EXAM: CT ABDOMEN AND PELVIS WITHOUT CONTRAST TECHNIQUE: Multidetector CT imaging of the abdomen and pelvis was performed  following the standard protocol without IV contrast. COMPARISON:  10/19/2013. FINDINGS: Lower chest: Again demonstrated is a large hiatal hernia containing fat and stomach. 5 mm left lower lobe nodule on image number 9 of series 4 without significant change. Additional previously demonstrated bilateral lung nodules are not currently included. Hepatobiliary: 2 adjacent 4 mm gallstones in the dependent portion of the gallbladder or single 6 mm stone. No gallbladder wall thickening or pericholecystic fluid. Unremarkable liver. Pancreas: Unremarkable. No pancreatic ductal dilatation or surrounding inflammatory changes. Spleen: Normal in size without focal abnormality. Adrenals/Urinary Tract: Moderate dilatation of the right renal collecting system to the level of an 8 mm UPJ calculus. Associated diffuse enlargement of the right kidney with right perinephric soft tissue stranding. Moderately atrophied left kidney. No additional urinary tract calculi or hydronephrosis. Normal appearing adrenal glands. Stomach/Bowel: Large number of colonic diverticula without evidence diverticulitis. No evidence of appendicitis. Unremarkable small bowel. Vascular/Lymphatic: Atheromatous arterial calcifications without aneurysm. No enlarged lymph nodes. Reproductive: Status post hysterectomy. No adnexal masses. Other: No abdominal wall hernia or abnormality. No abdominopelvic ascites. Musculoskeletal: Right hip prosthesis. Lumbar and lower thoracic spine degenerative changes. These include marked disc space narrowing and vacuum phenomenon at the L2-3 and L4-5 levels and moderate disc space narrowing at the L5-S1 level. There are also facet degenerative changes with associated grade 1 retrolisthesis at the L2-3 and L3-4 levels and grade 1 anterolisthesis at the L4-5 and L5-S1 levels. IMPRESSION: 1. 8 mm right UPJ calculus causing moderate right hydronephrosis. 2. Extensive colonic diverticulosis. 3. Cholelithiasis. 4. Moderate left renal  atrophy. Electronically Signed   By: Claudie Revering M.D.   On: 10/01/2016 21:01      Assessment & Plan:    Principal Problem:   Nephrolithiasis Active Problems:   Renal insufficiency    R sided pain secondary to  R UPJ Nephrolithiasis w R hydro and ARF NS iv Terazosin  Dilaudid, and percocet prn pain NPO after [facility] consulted by ED, appreciate input  UTI Rocephin 1gm iv qday Await Urine culture Cbc in am  ARF Hydrate with ns iv Check cmp in am  Anxiety Lorazepam 0.5mg  po bid prn   DVT Prophylaxis Lovenox , SCDs  AM Labs Ordered, also please review Full Orders  Family Communication: Admission, patients condition and plan of care including tests being ordered have been discussed with the patient  who indicate understanding and agree with the plan and Code Status.  Code Status FULL CODE  Likely DC to  home  Condition GUARDED    Consults called: urology by ED  Admission status: inpatient   Time spent in minutes : 45 minutes   Jani Gravel M.D on 10/01/2016 at 10:31 PM  Between 7am to 7pm - Pager - [phone] . After 7pm go to [email or web address] - password [facility]  [facility] - Office  [phone]

## 2016-10-01 NOTE — ED Notes (Signed)
Call report to [facility] [phone] @ 22:45

## 2016-10-01 NOTE — Progress Notes (Signed)
RN may call Belma RN for report [phone] at 22:45

## 2016-10-02 ENCOUNTER — Inpatient Hospital Stay (HOSPITAL_COMMUNITY): Payer: PPO | Admitting: Certified Registered Nurse Anesthetist

## 2016-10-02 ENCOUNTER — Encounter (HOSPITAL_COMMUNITY): Payer: Self-pay | Admitting: Urology

## 2016-10-02 ENCOUNTER — Encounter (HOSPITAL_COMMUNITY)
Admission: EM | Disposition: A | Discharge status: Home Health | Payer: Self-pay | Source: Home / Self Care | Attending: Internal Medicine

## 2016-10-02 ENCOUNTER — Inpatient Hospital Stay (HOSPITAL_COMMUNITY): Payer: PPO

## 2016-10-02 HISTORY — PX: CYSTOSCOPY WITH STENT PLACEMENT: SHX5790

## 2016-10-02 LAB — COMPREHENSIVE METABOLIC PANEL
ALT: 18 U/L (ref 14–54)
ANION GAP: 9 (ref 5–15)
AST: 33 U/L (ref 15–41)
Albumin: 3 g/dL — ABNORMAL LOW (ref 3.5–5.0)
Alkaline Phosphatase: 91 U/L (ref 38–126)
BUN: 23 mg/dL — AB (ref 6–20)
CO2: 22 mmol/L (ref 22–32)
Calcium: 8.1 mg/dL — ABNORMAL LOW (ref 8.9–10.3)
Chloride: 107 mmol/L (ref 101–111)
Creatinine, Ser: 2.12 mg/dL — ABNORMAL HIGH (ref 0.44–1.00)
GFR, EST AFRICAN AMERICAN: 23 mL/min — AB (ref >60)
GFR, EST NON AFRICAN AMERICAN: 20 mL/min — AB (ref >60)
Glucose, Bld: 106 mg/dL — ABNORMAL HIGH (ref 65–99)
POTASSIUM: 4.9 mmol/L (ref 3.5–5.1)
Sodium: 138 mmol/L (ref 135–145)
TOTAL PROTEIN: 5.4 g/dL — AB (ref 6.5–8.1)
Total Bilirubin: 0.7 mg/dL (ref 0.3–1.2)

## 2016-10-02 LAB — CBC
HEMATOCRIT: 36.8 % (ref 36.0–46.0)
Hemoglobin: 12 g/dL (ref 12.0–15.0)
MCH: 30.8 pg (ref 26.0–34.0)
MCHC: 32.6 g/dL (ref 30.0–36.0)
MCV: 94.6 fL (ref 78.0–100.0)
Platelets: 189 10*3/uL (ref 150–400)
RBC: 3.89 MIL/uL (ref 3.87–5.11)
RDW: 13.7 % (ref 11.5–15.5)
WBC: 20.1 10*3/uL — AB (ref 4.0–10.5)

## 2016-10-02 LAB — APTT: aPTT: 36 seconds (ref 24–36)

## 2016-10-02 LAB — PROTIME-INR
INR: 1.11
PROTHROMBIN TIME: 14.2 s (ref 11.4–15.2)

## 2016-10-02 SURGERY — CYSTOSCOPY, WITH STENT INSERTION
Anesthesia: Monitor Anesthesia Care | Site: Ureter | Laterality: Right

## 2016-10-02 MED ORDER — ENOXAPARIN SODIUM 30 MG/0.3ML ~~LOC~~ SOLN: 30.0000 mg | SUBCUTANEOUS | Status: DC

## 2016-10-02 MED ORDER — ONDANSETRON HCL 4 MG/2ML IJ SOLN
INTRAMUSCULAR | Status: AC
  Filled 2016-10-02: qty 2

## 2016-10-02 MED ORDER — DEXAMETHASONE SODIUM PHOSPHATE 10 MG/ML IJ SOLN
INTRAMUSCULAR | Status: AC
  Filled 2016-10-02: qty 1

## 2016-10-02 MED ORDER — SODIUM CHLORIDE 0.9 % IV SOLN
INTRAVENOUS | Status: AC
  Administered 2016-10-02 (×2): via INTRAVENOUS

## 2016-10-02 MED ORDER — FENTANYL CITRATE (PF) 100 MCG/2ML IJ SOLN
INTRAMUSCULAR | Status: DC | PRN
  Administered 2016-10-02: 50 ug via INTRAVENOUS

## 2016-10-02 MED ORDER — SODIUM CHLORIDE 0.9 % IV BOLUS (SEPSIS)
500.0000 mL | Freq: Once | INTRAVENOUS | Status: AC
  Administered 2016-10-02: 500 mL via INTRAVENOUS

## 2016-10-02 MED ORDER — DIPHENHYDRAMINE HCL 50 MG/ML IJ SOLN: 25.0000 mg | Freq: Four times a day (QID) | INTRAMUSCULAR | Status: DC | PRN

## 2016-10-02 MED ORDER — HYDROMORPHONE HCL-NACL 0.5-0.9 MG/ML-% IV SOSY: 1.0000 mg | PREFILLED_SYRINGE | Freq: Four times a day (QID) | INTRAVENOUS | Status: DC | PRN

## 2016-10-02 MED ORDER — LORAZEPAM 0.5 MG PO TABS: 0.5000 mg | ORAL_TABLET | Freq: Two times a day (BID) | ORAL | Status: DC | PRN

## 2016-10-02 MED ORDER — ACETAMINOPHEN 650 MG RE SUPP: 650.0000 mg | Freq: Four times a day (QID) | RECTAL | Status: DC | PRN

## 2016-10-02 MED ORDER — ONDANSETRON HCL 4 MG/2ML IJ SOLN
4.0000 mg | Freq: Four times a day (QID) | INTRAMUSCULAR | Status: DC | PRN
Start: 2016-10-02 — End: 2016-10-04
  Administered 2016-10-02: 4 mg via INTRAVENOUS

## 2016-10-02 MED ORDER — PHENYLEPHRINE 40 MCG/ML (10ML) SYRINGE FOR IV PUSH (FOR BLOOD PRESSURE SUPPORT)
PREFILLED_SYRINGE | INTRAVENOUS | Status: AC
  Filled 2016-10-02: qty 10

## 2016-10-02 MED ORDER — PROPOFOL 10 MG/ML IV BOLUS
INTRAVENOUS | Status: DC | PRN
  Administered 2016-10-02 (×2): 30 mg via INTRAVENOUS
  Administered 2016-10-02 (×2): 20 mg via INTRAVENOUS

## 2016-10-02 MED ORDER — INFLUENZA VAC SPLIT HIGH-DOSE 0.5 ML IM SUSY
0.5000 mL | PREFILLED_SYRINGE | INTRAMUSCULAR | Status: AC
  Administered 2016-10-03: 0.5 mL via INTRAMUSCULAR
  Filled 2016-10-02 (×2): qty 0.5

## 2016-10-02 MED ORDER — FENTANYL CITRATE (PF) 100 MCG/2ML IJ SOLN
INTRAMUSCULAR | Status: AC
  Filled 2016-10-02: qty 2

## 2016-10-02 MED ORDER — LIDOCAINE 2% (20 MG/ML) 5 ML SYRINGE
INTRAMUSCULAR | Status: AC
  Filled 2016-10-02: qty 5

## 2016-10-02 MED ORDER — SUCCINYLCHOLINE CHLORIDE 200 MG/10ML IV SOSY
PREFILLED_SYRINGE | INTRAVENOUS | Status: AC
  Filled 2016-10-02: qty 10

## 2016-10-02 MED ORDER — LIDOCAINE HCL (CARDIAC) 20 MG/ML IV SOLN
INTRAVENOUS | Status: DC | PRN
  Administered 2016-10-02: 50 mg via INTRAVENOUS

## 2016-10-02 MED ORDER — FENTANYL CITRATE (PF) 100 MCG/2ML IJ SOLN: 25.0000 ug | INTRAMUSCULAR | Status: DC | PRN

## 2016-10-02 MED ORDER — PHENYLEPHRINE HCL 10 MG/ML IJ SOLN
INTRAMUSCULAR | Status: DC | PRN
  Administered 2016-10-02 (×2): 80 ug via INTRAVENOUS

## 2016-10-02 MED ORDER — OXYCODONE-ACETAMINOPHEN 5-325 MG PO TABS
1.0000 | ORAL_TABLET | ORAL | Status: DC | PRN
  Administered 2016-10-02 – 2016-10-04 (×3): 1 via ORAL
  Filled 2016-10-02 (×4): qty 1

## 2016-10-02 MED ORDER — PROPOFOL 10 MG/ML IV BOLUS
INTRAVENOUS | Status: AC
  Filled 2016-10-02: qty 20

## 2016-10-02 MED ORDER — ACETAMINOPHEN 325 MG PO TABS: 650.0000 mg | ORAL_TABLET | Freq: Four times a day (QID) | ORAL | Status: DC | PRN

## 2016-10-02 MED ORDER — DEXTROSE 5 % IV SOLN
1.0000 g | INTRAVENOUS | Status: DC
  Administered 2016-10-02 – 2016-10-03 (×2): 1 g via INTRAVENOUS
  Filled 2016-10-02 (×3): qty 10

## 2016-10-02 MED ORDER — SODIUM CHLORIDE 0.9 % IV BOLUS (SEPSIS)
1000.0000 mL | Freq: Once | INTRAVENOUS | Status: AC
  Administered 2016-10-02: 1000 mL via INTRAVENOUS

## 2016-10-02 MED ORDER — IOHEXOL 300 MG/ML  SOLN
INTRAMUSCULAR | Status: DC | PRN
  Administered 2016-10-02: 5 mL via URETHRAL

## 2016-10-02 MED ORDER — SODIUM CHLORIDE 0.9 % IR SOLN
Status: DC | PRN
  Administered 2016-10-02: 3000 mL

## 2016-10-02 MED ORDER — LATANOPROST 0.005 % OP SOLN
1.0000 [drp] | Freq: Every day | OPHTHALMIC | Status: DC
  Administered 2016-10-02 – 2016-10-03 (×3): 1 [drp] via OPHTHALMIC
  Filled 2016-10-02: qty 2.5

## 2016-10-02 SURGICAL SUPPLY — 13 items
BAG URO CATCHER STRL LF (MISCELLANEOUS) ×3 IMPLANT
CATH URET 5FR 28IN OPEN ENDED (CATHETERS) ×2 IMPLANT
CLOTH BEACON ORANGE TIMEOUT ST (SAFETY) ×3 IMPLANT
COVER FOOTSWITCH UNIV (MISCELLANEOUS) ×2 IMPLANT
COVER SURGICAL LIGHT HANDLE (MISCELLANEOUS) ×3 IMPLANT
GLOVE SURG SS PI 8.0 STRL IVOR (GLOVE) ×2 IMPLANT
GOWN STRL REUS W/TWL XL LVL3 (GOWN DISPOSABLE) ×3 IMPLANT
GUIDEWIRE STR DUAL SENSOR (WIRE) ×3 IMPLANT
MANIFOLD NEPTUNE II (INSTRUMENTS) ×3 IMPLANT
PACK CYSTO (CUSTOM PROCEDURE TRAY) ×3 IMPLANT
STENT CONTOUR 6FRX26X.038 (STENTS) ×2 IMPLANT
TUBING CONNECTING 10 (TUBING) ×2 IMPLANT
TUBING CONNECTING 10' (TUBING) ×1

## 2016-10-02 NOTE — Brief Op Note (Signed)
10/01/2016 - 10/02/2016  2:04 PM  PATIENT:  Caroline Griffith  83 y.o. female  PRE-OPERATIVE DIAGNOSIS:  right proximal stone  POST-OPERATIVE DIAGNOSIS:  right proximal stone  PROCEDURE:  Procedure(s): CYSTOSCOPY, RIGHT RETROGRADE WITH RIGHT URETERAL STENT PLACEMENT (Right)  SURGEON:  Surgeon(s) and Role:    Irine Seal, MD - Primary  PHYSICIAN ASSISTANT:   ASSISTANTS: none   ANESTHESIA:   MAC  EBL:  Total I/O In: 61 [P.O.:50] Out: 0   BLOOD ADMINISTERED:none  DRAINS: 6 x 26 right JJ stent   LOCAL MEDICATIONS USED:  NONE  SPECIMEN:  No Specimen  DISPOSITION OF SPECIMEN:  N/A  COUNTS:  YES  TOURNIQUET:  * No tourniquets in log *  DICTATION: .Other Dictation: Dictation Number [id number]  PLAN OF CARE: Admit for overnight observation  PATIENT DISPOSITION:  PACU - hemodynamically stable.   Delay start of Pharmacological VTE agent (>24hrs) due to surgical blood loss or risk of bleeding: not applicable

## 2016-10-02 NOTE — Anesthesia Postprocedure Evaluation (Signed)
Anesthesia Post Note  Patient: Caroline Griffith  Procedure(s) Performed: Procedure(s) (LRB): CYSTOSCOPY, RIGHT RETROGRADE WITH RIGHT URETERAL STENT PLACEMENT (Right)     Patient location during evaluation: PACU Anesthesia Type: MAC Level of consciousness: awake and alert Pain management: pain level controlled Vital Signs Assessment: post-procedure vital signs reviewed and stable Respiratory status: spontaneous breathing, nonlabored ventilation, respiratory function stable and patient connected to nasal cannula oxygen Cardiovascular status: stable and blood pressure returned to baseline Postop Assessment: no apparent nausea or vomiting Anesthetic complications: no    Last Vitals:  Vitals:   10/02/16 1430 10/02/16 1445  BP: (!) 89/53 (!) 98/51  Pulse: 78 74  Resp: 10 20  Temp:  37.2 C  SpO2: 100% 100%    Last Pain:  Vitals:   10/02/16 0610  TempSrc: Oral  PainSc:                  Crystle Carelli,W. EDMOND

## 2016-10-02 NOTE — Anesthesia Preprocedure Evaluation (Addendum)
Anesthesia Evaluation  Patient identified by MRN, date of birth, ID band Patient awake    Reviewed: Allergy & Precautions, H&P , NPO status , Patient's Chart, lab work & pertinent test results  Airway Mallampati: III  TM Distance: >3 FB Neck ROM: Full    Dental no notable dental hx. (+) Teeth Intact, Dental Advisory Given   Pulmonary neg pulmonary ROS, former smoker,    Pulmonary exam normal breath sounds clear to auscultation       Cardiovascular negative cardio ROS   Rhythm:Regular Rate:Normal     Neuro/Psych negative neurological ROS  negative psych ROS   GI/Hepatic Neg liver ROS, hiatal hernia,   Endo/Other  negative endocrine ROS  Renal/GU Renal InsufficiencyRenal disease  negative genitourinary   Musculoskeletal  (+) Arthritis , Osteoarthritis,    Abdominal   Peds  Hematology negative hematology ROS (+)   Anesthesia Other Findings   Reproductive/Obstetrics negative OB ROS                           Anesthesia Physical Anesthesia Plan  ASA: II  Anesthesia Plan: MAC   Post-op Pain Management:    Induction: Intravenous  PONV Risk Score and Plan: 4 or greater and Ondansetron, Treatment may vary due to age or medical condition and Propofol infusion  Airway Management Planned: Simple Face Mask  Additional Equipment:   Intra-op Plan:   Post-operative Plan:   Informed Consent: I have reviewed the patients History and Physical, chart, labs and discussed the procedure including the risks, benefits and alternatives for the proposed anesthesia with the patient or authorized representative who has indicated his/her understanding and acceptance.   Dental advisory given  Plan Discussed with: CRNA  Anesthesia Plan Comments:       Anesthesia Quick Evaluation

## 2016-10-02 NOTE — Consult Note (Addendum)
Subjective: CC: Left flank pain.  Hx: Caroline Griffith is an 83 yo WF who I was asked to see in consultation  by Dr. Florencia Reasons.  She had the onset yesterday of severe right flank pain without nausea or vomiting.  She was seen in the ER where a CT showed an 64mm right proximal stone with obstruction.   Her voided UA looked infected but she had no voiding complaints, hematuria or fever at that time.  I spoke with the ER physician and we discussed getting a cath UA vs admission to medicine for observation.   She was admitted and then developed a leukocytosis and some hypotension and I was called to consider stenting.   She had a prior right renal stone treated with cystoscopy and stenting followed by ESWL in August 2016.  She has begun to have chills but has no other associated signs or symptoms.   ROS:  Review of Systems  Constitutional: Positive for chills.  Respiratory: Positive for shortness of breath.   Cardiovascular: Positive for leg swelling.  Genitourinary: Positive for flank pain.  Skin:       Venous stasis changes.   All other systems reviewed and are negative.   Allergies  Allergen Reactions  . Sulfa Antibiotics     Unknown     Past Medical History:  Diagnosis Date  . Arthritis   . Chronic kidney disease    kidney stones  . History of hiatal hernia   . Macular degeneration   . Pneumonia    hx. of  . Urolithiasis   . Varicose veins     Past Surgical History:  Procedure Laterality Date  . ABDOMINAL HYSTERECTOMY    . BUNIONECTOMY Right   . CYSTOSCOPY WITH STENT PLACEMENT Right 08/15/2014   Procedure: CYSTOSCOPY WITH RIGHT STENT PLACEMENT;  Surgeon: [name], MD;  Location: [facility];  Service: Urology;  Laterality: Right;  . EXTRACORPOREAL SHOCK WAVE LITHOTRIPSY Right 08/18/2014  . JOINT REPLACEMENT Right    Total hip replacement  . JOINT REPLACEMENT Right    Total knee replacement  . ORIF WRIST FRACTURE Left   . TONSILLECTOMY      Social History   Social  History  . Marital status: Widowed    Spouse name: N/A  . Number of children: N/A  . Years of education: N/A   Occupational History  . Not on file.   Social History Main Topics  . Smoking status: Former Smoker    Packs/day: 0.25    Years: 14.00    Types: Cigarettes    Quit date: 01/12/1963  . Smokeless tobacco: Never Used  . Alcohol use 0.0 oz/week     Comment: one drink a night  . Drug use: No  . Sexual activity: Not on file   Other Topics Concern  . Not on file   Social History Narrative  . No narrative on file    Family History  Problem Relation Age of Onset  . Heart disease Mother   . Asthma Mother   . Emphysema Mother        smoked  . Heart disease Father     Anti-infectives: Anti-infectives    Start     Dose/Rate Route Frequency Ordered Stop   10/02/16 2200  cefTRIAXone (ROCEPHIN) 1 g in dextrose 5 % 50 mL IVPB     1 g 100 mL/hr over 30 Minutes Intravenous Every 24 hours 10/02/16 0007     10/01/16 2000  cefTRIAXone (ROCEPHIN) 1 g in  dextrose 5 % 50 mL IVPB     1 g 100 mL/hr over 30 Minutes Intravenous  Once 10/01/16 1949 10/01/16 2149      Current Facility-Administered Medications  Medication Dose Route Frequency Provider Last Rate Last Dose  . 0.9 %  sodium chloride infusion   Intravenous Continuous Jani Gravel, MD 100 mL/hr at 10/02/16 0015    . acetaminophen (TYLENOL) tablet 650 mg  650 mg Oral Q6H PRN Jani Gravel, MD       Or  . acetaminophen (TYLENOL) suppository 650 mg  650 mg Rectal Q6H PRN Jani Gravel, MD      . cefTRIAXone (ROCEPHIN) 1 g in dextrose 5 % 50 mL IVPB  1 g Intravenous Q24H Jani Gravel, MD      . diphenhydrAMINE (BENADRYL) injection 25 mg  25 mg Intravenous Q6H PRN Jani Gravel, MD      . HYDROmorphone (DILAUDID) injection 1 mg  1 mg Intravenous Q6H PRN Jani Gravel, MD      . Derrill Memo ON 10/03/2016] Influenza vac split quadrivalent PF (FLUZONE HIGH-DOSE) injection 0.5 mL  0.5 mL Intramuscular Tomorrow-1000 Jani Gravel, MD      . latanoprost  (XALATAN) 0.005 % ophthalmic solution 1 drop  1 drop Both Eyes QHS Jani Gravel, MD   1 drop at 10/02/16 0030  . LORazepam (ATIVAN) tablet 0.5 mg  0.5 mg Oral BID PRN Jani Gravel, MD      . ondansetron [facility]) injection 4 mg  4 mg Intravenous Q6H PRN Jani Gravel, MD      . oxyCODONE-acetaminophen (PERCOCET/ROXICET) 5-325 MG per tablet 1 tablet  1 tablet Oral Q4H PRN Jani Gravel, MD      . sodium chloride 0.9 % bolus 1,000 mL  1,000 mL Intravenous Once Florencia Reasons, MD 1,000 mL/hr at 10/02/16 1222 1,000 mL at 10/02/16 1222  . terazosin (HYTRIN) capsule 1 mg  1 mg Oral QHS Jani Gravel, MD       Past medical, surgical, social and family history reviewed and updated.  Objective: Vital signs in last 24 hours: Temp:  [98.1 F (36.7 C)-99.8 F (37.7 C)] 98.1 F (36.7 C) (09/22 0610) Pulse Rate:  [79-118] 79 (09/22 0610) Resp:  [16-18] 18 (09/22 0610) BP: (84-134)/(48-86) 92/52 (09/22 0731) SpO2:  [82 %-98 %] 98 % (09/22 0610) Weight:  [74.2 kg (163 lb 8 oz)] 74.2 kg (163 lb 8 oz) (09/22 0025)  Intake/Output from previous day: 09/21 0701 - 09/22 0700 In: 1575 [I.V.:575; IV Piggyback:1000] Out: 0  Intake/Output this shift: Total I/O In: 50 [P.O.:50] Out: -    Physical Exam  Constitutional: She is oriented to person, place, and time and well-developed, well-nourished, and in no distress.  HENT:  Head: Normocephalic and atraumatic.  Neck: Normal range of motion. Neck supple. No thyromegaly present.  Cardiovascular: Normal rate, regular rhythm and normal heart sounds.   Pulmonary/Chest: Effort normal and breath sounds normal.  Abdominal: Soft. Bowel sounds are normal. She exhibits no mass. There is tenderness (right flank). There is no rebound and no guarding.  Musculoskeletal: Normal range of motion. She exhibits no edema or tenderness.  Neurological: She is alert and oriented to person, place, and time.  Skin: Skin is warm and dry.  Venous stasis changes bilateral on the LE's.   Psychiatric:  Mood and affect normal.    Lab Results:   Recent Labs  10/01/16 1652 10/02/16 0417  WBC 8.1 20.1*  HGB 13.9 12.0  HCT 42.0 36.8  PLT 239 189  BMET  Recent Labs  10/01/16 1652 10/02/16 0417  NA 141 138  K 3.9 4.9  CL 107 107  CO2 26 22  GLUCOSE 105* 106*  BUN 17 23*  CREATININE 1.57* 2.12*  CALCIUM 9.3 8.1*   PT/INR  Recent Labs  10/02/16 0417  LABPROT 14.2  INR 1.11   ABG No results for input(s): PHART, HCO3 in the last 72 hours.  Invalid input(s): PCO2, PO2  Studies/Results: Ct Abdomen Pelvis Wo Contrast  Result Date: 10/01/2016 CLINICAL DATA:  Acute onset right abdominal pain at 6 a.m. this morning. The pain now radiates into the lower back. Constipation for 1 day. EXAM: CT ABDOMEN AND PELVIS WITHOUT CONTRAST TECHNIQUE: Multidetector CT imaging of the abdomen and pelvis was performed following the standard protocol without IV contrast. COMPARISON:  10/19/2013. FINDINGS: Lower chest: Again demonstrated is a large hiatal hernia containing fat and stomach. 5 mm left lower lobe nodule on image number 9 of series 4 without significant change. Additional previously demonstrated bilateral lung nodules are not currently included. Hepatobiliary: 2 adjacent 4 mm gallstones in the dependent portion of the gallbladder or single 6 mm stone. No gallbladder wall thickening or pericholecystic fluid. Unremarkable liver. Pancreas: Unremarkable. No pancreatic ductal dilatation or surrounding inflammatory changes. Spleen: Normal in size without focal abnormality. Adrenals/Urinary Tract: Moderate dilatation of the right renal collecting system to the level of an 8 mm UPJ calculus. Associated diffuse enlargement of the right kidney with right perinephric soft tissue stranding. Moderately atrophied left kidney. No additional urinary tract calculi or hydronephrosis. Normal appearing adrenal glands. Stomach/Bowel: Large number of colonic diverticula without evidence diverticulitis. No evidence  of appendicitis. Unremarkable small bowel. Vascular/Lymphatic: Atheromatous arterial calcifications without aneurysm. No enlarged lymph nodes. Reproductive: Status post hysterectomy. No adnexal masses. Other: No abdominal wall hernia or abnormality. No abdominopelvic ascites. Musculoskeletal: Right hip prosthesis. Lumbar and lower thoracic spine degenerative changes. These include marked disc space narrowing and vacuum phenomenon at the L2-3 and L4-5 levels and moderate disc space narrowing at the L5-S1 level. There are also facet degenerative changes with associated grade 1 retrolisthesis at the L2-3 and L3-4 levels and grade 1 anterolisthesis at the L4-5 and L5-S1 levels. IMPRESSION: 1. 8 mm right UPJ calculus causing moderate right hydronephrosis. 2. Extensive colonic diverticulosis. 3. Cholelithiasis. 4. Moderate left renal atrophy. Electronically Signed   By: Claudie Revering M.D.   On: 10/01/2016 21:01   I have discussed her case with Dr. Erlinda Hong and reviewed the hospital notes, labs and CT films and report.     Assessment: Right proximal stone with obstruction and infection.   I am going to get her set up for cystoscopy with right ureteral stenting.   I have reviewed the risks of bleeding, infection, ureteral injury, possible need for a perc, thrombotic events and anesthetic complications.  She will need either ESWL or ureteroscopy to manage the stone once the infection has cleared.     CC: Dr. Florencia Reasons.      Haddie Bruhl J 10/02/2016 [phone]

## 2016-10-02 NOTE — Discharge Instructions (Signed)

## 2016-10-02 NOTE — Transfer of Care (Signed)
Immediate Anesthesia Transfer of Care Note  Patient: Caroline Griffith  Procedure(s) Performed: Procedure(s): CYSTOSCOPY, RIGHT RETROGRADE WITH RIGHT URETERAL STENT PLACEMENT (Right)  Patient Location: PACU  Anesthesia Type:MAC  Level of Consciousness:  sedated, patient cooperative and responds to stimulation  Airway & Oxygen Therapy:Patient Spontanous Breathing and Patient connected to face mask oxgen  Post-op Assessment:  Report given to PACU RN and Post -op Vital signs reviewed and stable  Post vital signs:  Reviewed and stable  Last Vitals:  Vitals:   10/02/16 0731 10/02/16 1320  BP: (!) 92/52 97/60  Pulse:    Resp:  16  Temp:  36.9 C  SpO2:  458%    Complications: No apparent anesthesia complications

## 2016-10-02 NOTE — Progress Notes (Signed)
PROGRESS NOTE  Caroline Griffith SAY:[id number] DOB: 1930/06/05 DOA: 10/01/2016 PCP: Lavone Orn, MD  HPI/Recap of past 24 hours:  Report pain has resolved, but no appetite, she has not eat anything She seems to be slightly confused, not able to provide reliable history Blood pressure is low, wbc significantly elevated, no fever  Assessment/Plan: Principal Problem:   Nephrolithiasis Active Problems:   Renal insufficiency  Right UPJ stone with associate hydronephrosis Ua/urine culture  obtained in the ED, she is started on rocephin Keep npo, hold lovenox prophylaxis,  Case discussed with Dr [name] over the phone, she is to be brought to the OR today   Sepsis with leukocytosis, hypotenstion Blood culture/urine culture in process ivf bolus Continue anx  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: Cr baseline 0.99 in 2016, cr 1.57 on presentation, cr 2.12 this am Expect improvement with treating renal stone and sepsis   Anxiety: stable, continue home meds ativan prn   Code Status: full  Family Communication: patient   Disposition Plan: not ready to discharge   Consultants:  urology  Procedures:  Plan to OR today  Antibiotics:  Rocephin from admission   Objective: BP (!) 92/52   Pulse 79   Temp 98.1 F (36.7 C) (Oral)   Resp 18   Ht 5' 7.5" (1.715 m)   Wt 74.2 kg (163 lb 8 oz)   SpO2 98%   BMI 25.23 kg/m   Intake/Output Summary (Last 24 hours) at 10/02/16 1229 Last data filed at 10/02/16 0600  Gross per 24 hour  Intake             1575 ml  Output                0 ml  Net             1575 ml   Filed Weights   10/02/16 0025  Weight: 74.2 kg (163 lb 8 oz)    Exam: Patient is examined daily including today on 10/02/2016, exams remain the same as of yesterday except that has changed    General:  Slight confusion  Cardiovascular: RRR  Respiratory: CTABL  Abdomen: Soft/ND/NT, positive BS  Musculoskeletal: No Edema, chronic venous stasis changes bilateral lower  extremities  Neuro: alert with slight confusion  Data Reviewed: Basic Metabolic Panel:  Recent Labs Lab 10/01/16 1652 10/02/16 0417  NA 141 138  K 3.9 4.9  CL 107 107  CO2 26 22  GLUCOSE 105* 106*  BUN 17 23*  CREATININE 1.57* 2.12*  CALCIUM 9.3 8.1*   Liver Function Tests:  Recent Labs Lab 10/01/16 1652 10/02/16 0417  AST 31 33  ALT 15 18  ALKPHOS 108 91  BILITOT 1.1 0.7  PROT 6.9 5.4*  ALBUMIN 4.0 3.0*    Recent Labs Lab 10/01/16 1652  LIPASE 20   No results for input(s): AMMONIA in the last 168 hours. CBC:  Recent Labs Lab 10/01/16 1652 10/02/16 0417  WBC 8.1 20.1*  HGB 13.9 12.0  HCT 42.0 36.8  MCV 93.5 94.6  PLT 239 189   Cardiac Enzymes:   No results for input(s): CKTOTAL, CKMB, CKMBINDEX, TROPONINI in the last 168 hours. BNP (last 3 results) No results for input(s): BNP in the last 8760 hours.  ProBNP (last 3 results) No results for input(s): PROBNP in the last 8760 hours.  CBG: No results for input(s): GLUCAP in the last 168 hours.  No results found for this or any previous visit (from the past 240 hour(s)).  Studies: Ct Abdomen Pelvis Wo Contrast  Result Date: 10/01/2016 CLINICAL DATA:  Acute onset right abdominal pain at 6 a.m. this morning. The pain now radiates into the lower back. Constipation for 1 day. EXAM: CT ABDOMEN AND PELVIS WITHOUT CONTRAST TECHNIQUE: Multidetector CT imaging of the abdomen and pelvis was performed following the standard protocol without IV contrast. COMPARISON:  10/19/2013. FINDINGS: Lower chest: Again demonstrated is a large hiatal hernia containing fat and stomach. 5 mm left lower lobe nodule on image number 9 of series 4 without significant change. Additional previously demonstrated bilateral lung nodules are not currently included. Hepatobiliary: 2 adjacent 4 mm gallstones in the dependent portion of the gallbladder or single 6 mm stone. No gallbladder wall thickening or pericholecystic fluid. Unremarkable  liver. Pancreas: Unremarkable. No pancreatic ductal dilatation or surrounding inflammatory changes. Spleen: Normal in size without focal abnormality. Adrenals/Urinary Tract: Moderate dilatation of the right renal collecting system to the level of an 8 mm UPJ calculus. Associated diffuse enlargement of the right kidney with right perinephric soft tissue stranding. Moderately atrophied left kidney. No additional urinary tract calculi or hydronephrosis. Normal appearing adrenal glands. Stomach/Bowel: Large number of colonic diverticula without evidence diverticulitis. No evidence of appendicitis. Unremarkable small bowel. Vascular/Lymphatic: Atheromatous arterial calcifications without aneurysm. No enlarged lymph nodes. Reproductive: Status post hysterectomy. No adnexal masses. Other: No abdominal wall hernia or abnormality. No abdominopelvic ascites. Musculoskeletal: Right hip prosthesis. Lumbar and lower thoracic spine degenerative changes. These include marked disc space narrowing and vacuum phenomenon at the L2-3 and L4-5 levels and moderate disc space narrowing at the L5-S1 level. There are also facet degenerative changes with associated grade 1 retrolisthesis at the L2-3 and L3-4 levels and grade 1 anterolisthesis at the L4-5 and L5-S1 levels. IMPRESSION: 1. 8 mm right UPJ calculus causing moderate right hydronephrosis. 2. Extensive colonic diverticulosis. 3. Cholelithiasis. 4. Moderate left renal atrophy. Electronically Signed   By: Claudie Revering M.D.   On: 10/01/2016 21:01    Scheduled Meds: . enoxaparin (LOVENOX) injection  30 mg Subcutaneous Q24H  . [START ON 10/03/2016] Influenza vac split quadrivalent PF  0.5 mL Intramuscular Tomorrow-1000  . latanoprost  1 drop Both Eyes QHS  . terazosin  1 mg Oral QHS    Continuous Infusions: . sodium chloride 100 mL/hr at 10/02/16 0015  . cefTRIAXone (ROCEPHIN)  IV    . sodium chloride 1,000 mL (10/02/16 1222)     Time spent: 35 mins I have personally  reviewed and interpreted on  10/02/2016 daily labs, tele strips, imagings as discussed above under date review session and assessment and plans.  I reviewed all nursing notes, pharmacy notes,   vitals, pertinent old records  I have discussed plan of care as described above with RN , patient and family on 10/02/2016  I have called urology consult and discussed the case with urology D [name].  Trenton Passow MD, PhD  [facility] Pager [phone]. If 7PM-7AM, please contact night-coverage at [email or web address], password [facility] 10/02/2016, 12:29 PM  LOS: 1 day

## 2016-10-03 ENCOUNTER — Inpatient Hospital Stay (HOSPITAL_COMMUNITY): Payer: PPO

## 2016-10-03 LAB — BASIC METABOLIC PANEL
Anion gap: 6 (ref 5–15)
BUN: 27 mg/dL — AB (ref 6–20)
CHLORIDE: 112 mmol/L — AB (ref 101–111)
CO2: 21 mmol/L — AB (ref 22–32)
Calcium: 7.7 mg/dL — ABNORMAL LOW (ref 8.9–10.3)
Creatinine, Ser: 1.58 mg/dL — ABNORMAL HIGH (ref 0.44–1.00)
GFR calc Af Amer: 34 mL/min — ABNORMAL LOW (ref >60)
GFR calc non Af Amer: 29 mL/min — ABNORMAL LOW (ref >60)
GLUCOSE: 82 mg/dL (ref 65–99)
POTASSIUM: 4.1 mmol/L (ref 3.5–5.1)
Sodium: 139 mmol/L (ref 135–145)

## 2016-10-03 LAB — CBC WITH DIFFERENTIAL/PLATELET
Basophils Absolute: 0 10*3/uL (ref 0.0–0.1)
Basophils Relative: 0 %
EOS PCT: 1 %
Eosinophils Absolute: 0.2 10*3/uL (ref 0.0–0.7)
HEMATOCRIT: 36.2 % (ref 36.0–46.0)
HEMOGLOBIN: 11.9 g/dL — AB (ref 12.0–15.0)
LYMPHS ABS: 0.7 10*3/uL (ref 0.7–4.0)
LYMPHS PCT: 6 %
MCH: 31.1 pg (ref 26.0–34.0)
MCHC: 32.9 g/dL (ref 30.0–36.0)
MCV: 94.5 fL (ref 78.0–100.0)
Monocytes Absolute: 0.9 10*3/uL (ref 0.1–1.0)
Monocytes Relative: 7 %
Neutro Abs: 11 10*3/uL — ABNORMAL HIGH (ref 1.7–7.7)
Neutrophils Relative %: 86 %
PLATELETS: 150 10*3/uL (ref 150–400)
RBC: 3.83 MIL/uL — AB (ref 3.87–5.11)
RDW: 14 % (ref 11.5–15.5)
WBC: 12.7 10*3/uL — AB (ref 4.0–10.5)

## 2016-10-03 MED ORDER — ENOXAPARIN SODIUM 30 MG/0.3ML ~~LOC~~ SOLN
30.0000 mg | SUBCUTANEOUS | Status: DC
Start: 2016-10-03 — End: 2016-10-04
  Administered 2016-10-03: 30 mg via SUBCUTANEOUS
  Filled 2016-10-03: qty 0.3

## 2016-10-03 NOTE — Progress Notes (Signed)
1 Day Post-Op  Subjective: Caroline Griffith is improving post placement of a right ureteral stent on 10/02/16 for an obstructing right proximal stone with infection.  She has reduced pain and fever but has some increased irritative voiding symptoms.  She has no associated signs or symptoms.  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  ROS:  Review of Systems  Constitutional: Negative for chills and fever.  Respiratory: Negative for shortness of breath.   Cardiovascular: Negative for chest pain.  Gastrointestinal: Negative for abdominal pain.    Anti-infectives: Anti-infectives    Start     Dose/Rate Route Frequency Ordered Stop   10/02/16 2200  cefTRIAXone (ROCEPHIN) 1 g in dextrose 5 % 50 mL IVPB     1 g 100 mL/hr over 30 Minutes Intravenous Every 24 hours 10/02/16 0007     10/01/16 2000  cefTRIAXone (ROCEPHIN) 1 g in dextrose 5 % 50 mL IVPB     1 g 100 mL/hr over 30 Minutes Intravenous  Once 10/01/16 1949 10/01/16 2149      Current Facility-Administered Medications  Medication Dose Route Frequency Provider Last Rate Last Dose  . acetaminophen (TYLENOL) tablet 650 mg  650 mg Oral Q6H PRN Jani Gravel, MD       Or  . acetaminophen (TYLENOL) suppository 650 mg  650 mg Rectal Q6H PRN Jani Gravel, MD      . cefTRIAXone (ROCEPHIN) 1 g in dextrose 5 % 50 mL IVPB  1 g Intravenous Q24H Jani Gravel, MD   Stopped at 10/02/16 2214  . diphenhydrAMINE (BENADRYL) injection 25 mg  25 mg Intravenous Q6H PRN Jani Gravel, MD      . HYDROmorphone (DILAUDID) injection 1 mg  1 mg Intravenous Q6H PRN Jani Gravel, MD      . Influenza vac split quadrivalent PF (FLUZONE HIGH-DOSE) injection 0.5 mL  0.5 mL Intramuscular Tomorrow-1000 Jani Gravel, MD      . latanoprost (XALATAN) 0.005 % ophthalmic solution 1 drop  1 drop Both Eyes QHS Jani Gravel, MD   1 drop at 10/02/16 2211  . LORazepam (ATIVAN) tablet 0.5 mg  0.5 mg Oral BID PRN Jani Gravel, MD      . ondansetron [facility]) injection 4 mg  4  mg Intravenous Q6H PRN Jani Gravel, MD   4 mg at 10/02/16 1357  . oxyCODONE-acetaminophen (PERCOCET/ROXICET) 5-325 MG per tablet 1 tablet  1 tablet Oral Q4H PRN Jani Gravel, MD   1 tablet at 10/02/16 1607  . terazosin (HYTRIN) capsule 1 mg  1 mg Oral QHS Blount, Scarlette Shorts T, NP         Objective: Vital signs in last 24 hours: Temp:  [98.2 F (36.8 C)-100 F (37.8 C)] 98.3 F (36.8 C) (09/23 0843) Pulse Rate:  [73-83] 73 (09/23 0843) Resp:  [10-20] 20 (09/23 0843) BP: (89-117)/(45-60) 117/45 (09/23 0843) SpO2:  [92 %-100 %] 97 % (09/23 0843) Weight:  [76.6 kg (168 lb 14 oz)] 76.6 kg (168 lb 14 oz) (09/23 0634)  Intake/Output from previous day: 09/22 0701 - 09/23 0700 In: 525 [P.O.:50; I.V.:425; IV Piggyback:50] Out: 750 [Urine:750] Intake/Output this shift: No intake/output data recorded.   Physical Exam  Constitutional: She is well-developed, well-nourished, and in no distress.  Cardiovascular: Normal rate.   Pulmonary/Chest: Effort normal. No respiratory distress.  Abdominal: Soft. There is tenderness (right CVAT.).  Vitals reviewed.   Lab Results:   Recent Labs  10/02/16 0417 10/03/16 0436  WBC 20.1* 12.7*  HGB 12.0 11.9*  HCT 36.8  36.2  PLT 189 150   BMET  Recent Labs  10/02/16 0417 10/03/16 0436  NA 138 139  K 4.9 4.1  CL 107 112*  CO2 22 21*  GLUCOSE 106* 82  BUN 23* 27*  CREATININE 2.12* 1.58*  CALCIUM 8.1* 7.7*   PT/INR  Recent Labs  10/02/16 0417  LABPROT 14.2  INR 1.11   ABG No results for input(s): PHART, HCO3 in the last 72 hours.  Invalid input(s): PCO2, PO2  Studies/Results: Ct Abdomen Pelvis Wo Contrast  Result Date: 10/01/2016 CLINICAL DATA:  Acute onset right abdominal pain at 6 a.m. this morning. The pain now radiates into the lower back. Constipation for 1 day. EXAM: CT ABDOMEN AND PELVIS WITHOUT CONTRAST TECHNIQUE: Multidetector CT imaging of the abdomen and pelvis was performed following the standard protocol without IV  contrast. COMPARISON:  10/19/2013. FINDINGS: Lower chest: Again demonstrated is a large hiatal hernia containing fat and stomach. 5 mm left lower lobe nodule on image number 9 of series 4 without significant change. Additional previously demonstrated bilateral lung nodules are not currently included. Hepatobiliary: 2 adjacent 4 mm gallstones in the dependent portion of the gallbladder or single 6 mm stone. No gallbladder wall thickening or pericholecystic fluid. Unremarkable liver. Pancreas: Unremarkable. No pancreatic ductal dilatation or surrounding inflammatory changes. Spleen: Normal in size without focal abnormality. Adrenals/Urinary Tract: Moderate dilatation of the right renal collecting system to the level of an 8 mm UPJ calculus. Associated diffuse enlargement of the right kidney with right perinephric soft tissue stranding. Moderately atrophied left kidney. No additional urinary tract calculi or hydronephrosis. Normal appearing adrenal glands. Stomach/Bowel: Large number of colonic diverticula without evidence diverticulitis. No evidence of appendicitis. Unremarkable small bowel. Vascular/Lymphatic: Atheromatous arterial calcifications without aneurysm. No enlarged lymph nodes. Reproductive: Status post hysterectomy. No adnexal masses. Other: No abdominal wall hernia or abnormality. No abdominopelvic ascites. Musculoskeletal: Right hip prosthesis. Lumbar and lower thoracic spine degenerative changes. These include marked disc space narrowing and vacuum phenomenon at the L2-3 and L4-5 levels and moderate disc space narrowing at the L5-S1 level. There are also facet degenerative changes with associated grade 1 retrolisthesis at the L2-3 and L3-4 levels and grade 1 anterolisthesis at the L4-5 and L5-S1 levels. IMPRESSION: 1. 8 mm right UPJ calculus causing moderate right hydronephrosis. 2. Extensive colonic diverticulosis. 3. Cholelithiasis. 4. Moderate left renal atrophy. Electronically Signed   By: Claudie Revering M.D.   On: 10/01/2016 21:01   Dg C-arm 1-60 Min-no Report  Result Date: 10/02/2016 Fluoroscopy was utilized by the requesting physician.  No radiographic interpretation.     Assessment and Plan: Right ureteral stone with infection - she is improving post stent insertion.  I will get a KUB today to help determine whether she is a candidate for ESWL or whether she will need ureteroscopy.  ARI - improving post stenting.  Chronic follicular cystitis - I may want to keep her on suppressive therapy through completion of treatment depending on the culture results.      LOS: 2 days    Caroline Griffith 10/03/2016 [phone] ID: Caroline Griffith, female   DOB: 09/09/1930, 83 y.o.   MRN: [id number]

## 2016-10-03 NOTE — Progress Notes (Signed)
PROGRESS NOTE  Nipomo JON PJA:[id number] DOB: 08-Feb-1930 DOA: 10/01/2016 PCP: Lavone Orn, MD  HPI/Recap of past 24 hours:  S/p right ureteral stent on 9/22.  she is feeling better today,. tmax 100, blood pressure improved, Cr improved No hematuria She reports still has intermittent right sided back pain and chronic hip pain requiring prn percocet   Assessment/Plan: Principal Problem:   Nephrolithiasis Active Problems:   Renal insufficiency  Right UPJ stone with associate hydronephrosis/UTI urine culture  + g-rods, blood culture pending,  she is started on rocephin  urology  Dr [name] consulted, s/p right ureteral stent on 9/22, will follow urology recommendations.    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: Cr baseline 0.99 in 2016, cr 1.57 on presentation, cr 2.12 this am Expect improvement with treating renal stone and sepsis Cr 1.58 this am   Anxiety: stable, continue home meds ativan prn   Code Status: full  Family Communication: patient   Disposition Plan: need urology clearance for discharge   Consultants:  urology  Procedures: right ureteral stent on 10/02/16   Antibiotics:  Rocephin from admission   Objective: BP (!) 117/45 (BP Location: Left Arm)   Pulse 73   Temp 98.3 F (36.8 C) (Oral)   Resp 20   Ht 5' 7.5" (1.715 m)   Wt 76.6 kg (168 lb 14 oz)   SpO2 97%   BMI 26.06 kg/m   Intake/Output Summary (Last 24 hours) at 10/03/16 1137 Last data filed at 10/03/16 0649  Gross per 24 hour  Intake              475 ml  Output              750 ml  Net             -275 ml   Filed Weights   10/02/16 0025 10/03/16 0634  Weight: 74.2 kg (163 lb 8 oz) 76.6 kg (168 lb 14 oz)    Exam: Patient is examined daily including today on 10/03/2016, exams remain the same as of yesterday except that has changed     General:  Still frail, but Looks better, aaox3  Cardiovascular: RRR  Respiratory: CTABL  Abdomen: Soft/ND/NT, positive BS  Musculoskeletal: No Edema, chronic venous stasis changes bilateral lower extremities  Neuro: aaox3  Data Reviewed: Basic Metabolic Panel:  Recent Labs Lab 10/01/16 1652 10/02/16 0417 10/03/16 0436  NA 141 138 139  K 3.9 4.9 4.1  CL 107 107 112*  CO2 26 22 21*  GLUCOSE 105* 106* 82  BUN 17 23* 27*  CREATININE 1.57* 2.12* 1.58*  CALCIUM 9.3 8.1* 7.7*   Liver Function Tests:  Recent Labs Lab 10/01/16 1652 10/02/16 0417  AST 31 33  ALT 15 18  ALKPHOS 108 91  BILITOT 1.1 0.7  PROT 6.9 5.4*  ALBUMIN 4.0 3.0*    Recent Labs Lab 10/01/16 1652  LIPASE 20   No results for input(s): AMMONIA in the last 168 hours. CBC:  Recent Labs Lab 10/01/16 1652 10/02/16 0417 10/03/16 0436  WBC 8.1 20.1* 12.7*  NEUTROABS  --   --  11.0*  HGB 13.9 12.0 11.9*  HCT 42.0 36.8 36.2  MCV 93.5 94.6 94.5  PLT 239 189 150   Cardiac Enzymes:   No results for input(s): CKTOTAL, CKMB, CKMBINDEX, TROPONINI in the last 168 hours. BNP (last 3 results) No results  for input(s): BNP in the last 8760 hours.  ProBNP (last 3 results) No results for input(s): PROBNP in the last 8760 hours.  CBG: No results for input(s): GLUCAP in the last 168 hours.  Recent Results (from the past 240 hour(s))  Urine culture     Status: Abnormal (Preliminary result)   Collection Time: 10/01/16  2:59 PM  Result Value Ref Range Status   Specimen Description URINE, RANDOM  Final   Special Requests NONE  Final   Culture >=100,000 COLONIES/mL GRAM NEGATIVE RODS (A)  Final   Report Status PENDING  Incomplete     Studies: Dg Abd 1 View  Result Date: 10/03/2016 CLINICAL DATA:  History of recent ureteral stent placement EXAM: ABDOMEN - 1 VIEW COMPARISON:  10/01/2016 FINDINGS: Scattered large and small bowel gas is noted. A right ureteral stent is noted. The known previously  seen right UPJ stone is not as well appreciated on this exam due to overlying bowel gas. Right hip replacement is noted. Degenerative changes of the lumbar spine are seen. IMPRESSION: Right ureteral stent in satisfactory position. Electronically Signed   By: Inez Catalina M.D.   On: 10/03/2016 11:19   Dg C-arm 1-60 Min-no Report  Result Date: 10/02/2016 Fluoroscopy was utilized by the requesting physician.  No radiographic interpretation.    Scheduled Meds: . Influenza vac split quadrivalent PF  0.5 mL Intramuscular Tomorrow-1000  . latanoprost  1 drop Both Eyes QHS  . terazosin  1 mg Oral QHS    Continuous Infusions: . cefTRIAXone (ROCEPHIN)  IV Stopped (10/02/16 2214)     Time spent: 25 mins I have personally reviewed and interpreted on  10/03/2016 daily labs, tele strips, imagings as discussed above under data review session and assessment and plans.  I reviewed all nursing notes, pharmacy notes,   Consultant note, vitals, pertinent old records  I have discussed plan of care as described above with RN , patient and family on 10/03/2016   Lindley Hiney MD, PhD  [facility] Pager [phone]. If 7PM-7AM, please contact night-coverage at [email or web address], password [facility] 10/03/2016, 11:37 AM  LOS: 2 days

## 2016-10-03 NOTE — Progress Notes (Signed)
Patient stable and uneventful throughout shift.  GI at bedside discussed ordered x-ray for stone and stent placement.  Patient verbalized understanding.  Continue with plan of care.

## 2016-10-04 ENCOUNTER — Encounter (HOSPITAL_COMMUNITY): Payer: Self-pay | Admitting: Urology

## 2016-10-04 ENCOUNTER — Other Ambulatory Visit: Payer: Self-pay | Admitting: Urology

## 2016-10-04 LAB — BASIC METABOLIC PANEL
Anion gap: 9 (ref 5–15)
BUN: 19 mg/dL (ref 6–20)
CALCIUM: 8.1 mg/dL — AB (ref 8.9–10.3)
CO2: 20 mmol/L — AB (ref 22–32)
CREATININE: 1.19 mg/dL — AB (ref 0.44–1.00)
Chloride: 109 mmol/L (ref 101–111)
GFR calc Af Amer: 47 mL/min — ABNORMAL LOW (ref >60)
GFR calc non Af Amer: 41 mL/min — ABNORMAL LOW (ref >60)
GLUCOSE: 102 mg/dL — AB (ref 65–99)
Potassium: 4 mmol/L (ref 3.5–5.1)
Sodium: 138 mmol/L (ref 135–145)

## 2016-10-04 LAB — CBC WITH DIFFERENTIAL/PLATELET
Basophils Absolute: 0 10*3/uL (ref 0.0–0.1)
Basophils Relative: 0 %
EOS PCT: 1 %
Eosinophils Absolute: 0.1 10*3/uL (ref 0.0–0.7)
HEMATOCRIT: 37.6 % (ref 36.0–46.0)
Hemoglobin: 12.5 g/dL (ref 12.0–15.0)
LYMPHS PCT: 12 %
Lymphs Abs: 1.2 10*3/uL (ref 0.7–4.0)
MCH: 30.4 pg (ref 26.0–34.0)
MCHC: 33.2 g/dL (ref 30.0–36.0)
MCV: 91.5 fL (ref 78.0–100.0)
MONO ABS: 0.7 10*3/uL (ref 0.1–1.0)
MONOS PCT: 7 %
NEUTROS ABS: 8 10*3/uL — AB (ref 1.7–7.7)
Neutrophils Relative %: 80 %
Platelets: 166 10*3/uL (ref 150–400)
RBC: 4.11 MIL/uL (ref 3.87–5.11)
RDW: 13.8 % (ref 11.5–15.5)
WBC: 10.1 10*3/uL (ref 4.0–10.5)

## 2016-10-04 MED ORDER — ENOXAPARIN SODIUM 40 MG/0.4ML ~~LOC~~ SOLN: 40.0000 mg | SUBCUTANEOUS | Status: DC

## 2016-10-04 MED ORDER — SENNOSIDES-DOCUSATE SODIUM 8.6-50 MG PO TABS: 1.0000 | ORAL_TABLET | Freq: Every day | ORAL | 0 refills | Status: DC

## 2016-10-04 MED ORDER — CEPHALEXIN 500 MG PO CAPS: 500.0000 mg | ORAL_CAPSULE | Freq: Two times a day (BID) | ORAL | 0 refills | Status: AC

## 2016-10-04 MED ORDER — TERAZOSIN HCL 1 MG PO CAPS: 1.0000 mg | ORAL_CAPSULE | Freq: Every day | ORAL | 0 refills | Status: AC

## 2016-10-04 MED ORDER — ALPRAZOLAM 0.25 MG PO TABS
0.2500 mg | ORAL_TABLET | Freq: Once | ORAL | Status: DC
  Filled 2016-10-04: qty 1

## 2016-10-04 MED ORDER — CEPHALEXIN 500 MG PO CAPS: 500.0000 mg | ORAL_CAPSULE | Freq: Three times a day (TID) | ORAL | Status: DC

## 2016-10-04 MED ORDER — CEPHALEXIN 500 MG PO CAPS
500.0000 mg | ORAL_CAPSULE | Freq: Two times a day (BID) | ORAL | Status: DC
  Administered 2016-10-04: 500 mg via ORAL
  Filled 2016-10-04: qty 1

## 2016-10-04 MED ORDER — SENNOSIDES-DOCUSATE SODIUM 8.6-50 MG PO TABS
1.0000 | ORAL_TABLET | Freq: Two times a day (BID) | ORAL | Status: DC
  Administered 2016-10-04: 1 via ORAL
  Filled 2016-10-04: qty 1

## 2016-10-04 NOTE — Progress Notes (Signed)
   10/04/16 1300  Clinical Encounter Type  Visited With Patient  Visit Type Initial  Referral From Nurse  Consult/Referral To Chaplain   Following up on a consult request for AD.  Patient indicated not today,because they are getting ready to get discharged.  Will follow up as needed. Chaplain Katherene Ponto

## 2016-10-04 NOTE — Progress Notes (Signed)
Pt had no preference [facility] was selected. Referral given to in house rep.

## 2016-10-04 NOTE — Evaluation (Signed)
Physical Therapy Evaluation Patient Details Name: Caroline Griffith MRN: [id number] DOB: June 16, 1930 Today's Date: 10/04/2016   History of Present Illness  83 y.o. female, w history of nephrolithiasis w  ESL of large right pelvic stone s/p cyst/stent 08/15/2014 [name]), apparently c/o R sided pain beginning this am.  Denies fever, chills, n/v, dysuria, hematuria.  Pt present to ED due to pain.  Pt admitted for right sided pain secondary to nephrolithiasis and mild renal insufficiency, and UTI. She underwent stent placement 10-02-16.  Clinical Impression  Pt admitted with above diagnosis. Pt currently with functional limitations due to the deficits listed below (see PT Problem List). On eval, pt required min assist bed mobility, min guard assist transfers and min guard assist ambulation 150 feet with RW. Pt has 4 steps to enter her house. Daughter will assist with pt's return mobility into house but pt does live alone. Pt will benefit from skilled PT to increase their independence and safety with mobility to allow discharge to the venue listed below.       Follow Up Recommendations Home health PT;Supervision - Intermittent    Equipment Recommendations  None recommended by PT    Recommendations for Other Services       Precautions / Restrictions Precautions Precautions: Fall      Mobility  Bed Mobility Overal bed mobility: Needs Assistance Bed Mobility: Supine to Sit;Sit to Supine     Supine to sit: Modified independent (Device/Increase time) Sit to supine: Min assist   General bed mobility comments: assist for LLE into bed due to hip pain. Pt educated on using looped sheet to self assist LLE in bed.  Transfers Overall transfer level: Needs assistance Equipment used: Rolling walker (2 wheeled) Transfers: Sit to/from [facility] Sit to Stand: Min guard Stand pivot transfers: Min guard       General transfer comment: Pt demo safe  technique.  Ambulation/Gait Ambulation/Gait assistance: Min guard Ambulation Distance (Feet): 150 Feet Assistive device: Rolling walker (2 wheeled) Gait Pattern/deviations: Step-through pattern;Decreased stride length Gait velocity: decreased Gait velocity interpretation: Below normal speed for age/gender General Gait Details: steady gait  Stairs            Wheelchair Mobility    Modified Rankin (Stroke Patients Only)       Balance Overall balance assessment: Needs assistance Sitting-balance support: No upper extremity supported;Feet supported Sitting balance-Leahy Scale: Good     Standing balance support: Bilateral upper extremity supported;During functional activity Standing balance-Leahy Scale: Fair Standing balance comment: RW needed for ambulation                             Pertinent Vitals/Pain Pain Assessment: Faces Faces Pain Scale: Hurts little more Pain Location: L hip Pain Descriptors / Indicators: Aching;Sore;Grimacing Pain Intervention(s): Monitored during session;Repositioned    Home Living Family/patient expects to be discharged to:: Private residence Living Arrangements: Alone Available Help at Discharge: Family;Available PRN/intermittently Type of Home: House Home Access: Stairs to enter Entrance Stairs-Rails: Psychiatric nurse of Steps: 4 Home Layout: One level Home Equipment: Walker - 2 wheels;Cane - single point      Prior Function Level of Independence: Independent with assistive device(s)               Hand Dominance        Extremity/Trunk Assessment   Upper Extremity Assessment Upper Extremity Assessment: Generalized weakness    Lower Extremity Assessment Lower Extremity Assessment: Generalized weakness  Cervical / Trunk Assessment Cervical / Trunk Assessment: Normal  Communication   Communication: No difficulties  Cognition Arousal/Alertness: Awake/alert Behavior During Therapy:  WFL for tasks assessed/performed Overall Cognitive Status: Within Functional Limits for tasks assessed                                        General Comments      Exercises     Assessment/Plan    PT Assessment Patient needs continued PT services  PT Problem List Decreased strength;Decreased activity tolerance;Decreased balance;Decreased mobility;Pain       PT Treatment Interventions Gait training;Stair training;Functional mobility training;Balance training;Therapeutic exercise;Therapeutic activities;Patient/family education    PT Goals (Current goals can be found in the Care Plan section)  Acute Rehab PT Goals Patient Stated Goal: home PT Goal Formulation: With patient/family Time For Goal Achievement: 10/18/16 Potential to Achieve Goals: Good    Frequency Min 3X/week   Barriers to discharge Decreased caregiver support      Co-evaluation               AM-PAC PT "6 Clicks" Daily Activity  Outcome Measure Difficulty turning over in bed (including adjusting bedclothes, sheets and blankets)?: A Little Difficulty moving from lying on back to sitting on the side of the bed? : A Little Difficulty sitting down on and standing up from a chair with arms (e.g., wheelchair, bedside commode, etc,.)?: A Little Help needed moving to and from a bed to chair (including a wheelchair)?: None Help needed walking in hospital room?: None Help needed climbing 3-5 steps with a railing? : A Little 6 Click Score: 20    End of Session Equipment Utilized During Treatment: Gait belt Activity Tolerance: Patient tolerated treatment well Patient left: in bed;with call bell/phone within reach;with family/visitor present Nurse Communication: Mobility status PT Visit Diagnosis: Difficulty in walking, not elsewhere classified (R26.2);Muscle weakness (generalized) (M62.81)    Time: 0932-3557 PT Time Calculation (min) (ACUTE ONLY): 22 min   Charges:   PT Evaluation $PT Eval  Moderate Complexity: 1 Mod     PT G Codes:        [name], PT  Office # [phone] Pager [phone]   Lorriane Shire 10/04/2016, 1:14 PM

## 2016-10-04 NOTE — Op Note (Signed)
NAME:  Caroline Griffith, Caroline Griffith                     ACCOUNT NO.:  MEDICAL RECORD NO.:  [id number]  LOCATION:                                 FACILITY:  PHYSICIAN:  Marshall Cork. [name], M.D.         DATE OF BIRTH:  DATE OF PROCEDURE:  10/01/2016 DATE OF DISCHARGE:                              OPERATIVE REPORT   Patient of Dr. Florencia Reasons.  PROCEDURE:  Cystoscopy with right retrograde pyelogram with interpretation and insertion of right double-J stent.  PREOPERATIVE DIAGNOSIS:  Right proximal stone with obstruction and infection.  POSTOPERATIVE DIAGNOSIS:  Right proximal stone with obstruction and infection.  SURGEON:  Marshall Cork. [name], M.D.  ANESTHESIA:  General.  SPECIMEN:  None.  DRAINS:  A 6-French x 26 cm right contour double-J stent.  BLOOD LOSS:  None.  COMPLICATIONS:  None.  INDICATIONS:  [name] is an 83 year old white female with history of stones, who presented to the emergency room last night with left flank pain.  Her urine had pyuria.  She was admitted for observation and has subsequently developed leukocytosis and hypotension, was felt that stenting was indicated.  FINDINGS AND PROCEDURE:  She had been on Rocephin.  She was taken to the operating room, where she was placed in lithotomy position and sedation was given as needed.  Her genitalia was prepped with Betadine solution. She was draped in usual sterile fashion.  Cystoscopy was performed using a 23-French scope and 30-degree lens. Examination revealed a normal urethra.  The bladder wall had mild trabeculation with diffuse changes consistent with follicular cystitis. Ureteral orifices were unremarkable.  Right ureteral orifice was cannulated with a 5-French open-end catheter and a very gentle retrograde pyelogram was performed.  This revealed a normal ureter to the UPJ where there was a filling defect consistent with a stone and mild-to-moderate hydro proximally.  A guidewire was then passed through the open-end  catheter by the stone into the kidney and the open-end catheter was removed.  A 6-French 26 cm Contour double-J stent was inserted to kidney under fluoroscopic guidance.  The wire was removed leaving a good coil in the kidney, a good coil in the bladder.  The bladder was drained.  The patient has taken down from lithotomy position.  Her anesthetic was reversed.  She was moved to recovery room in stable condition.  There were no complications.     Marshall Cork. [name], M.D.     JJW/MEDQ  D:  10/02/2016  T:  10/02/2016  Job:  [id number]

## 2016-10-04 NOTE — Discharge Summary (Addendum)
Discharge Summary  Caroline Griffith OJJ:[id number] DOB: 07/26/1930  PCP: Lavone Orn, MD  Admit date: 10/01/2016 Discharge date: 10/04/2016  Time spent: >79mins, more than 50% time spent on coordination of care. Case discussed with urology/pharmacy.   Recommendations for Outpatient Follow-up:  1. F/u with PMD within a week  for hospital discharge follow up, repeat cbc/bmp at follow up 2. F/u with urology in 1-2 weeks for definitive stone treatment 3. F/u with neurology for memory loss  Discharge Diagnoses:  Active Hospital Problems   Diagnosis Date Noted  . Nephrolithiasis 10/01/2016  . Renal insufficiency 10/01/2016    Resolved Hospital Problems   Diagnosis Date Noted Date Resolved  No resolved problems to display.    Discharge Condition: stable  Diet recommendation: regular diet  Filed Weights   10/02/16 0025 10/03/16 0634 10/04/16 0438  Weight: 74.2 kg (163 lb 8 oz) 76.6 kg (168 lb 14 oz) 76.5 kg (168 lb 10.4 oz)    History of present illness:  (per admitting MD Dr [name]) Caroline Griffith  is a 83 y.o. female, w history of nephrolithiasis w  ESL of large right pelvic stone s/p cyst/stent 08/15/2014 [name]), apparently c/o R sided pain beginning this am.  Denies fever, chills, n/v, dysuria, hematuria.  Pt present to ED due to pain.   In ED,  CT scan abd/ pelvis=>  58mm right UPJ calculus causing moderate right hydronephrolsis , cholelithiasis, diverticulosis, moderate L renal atrophy.  Bun/creat 17/1.57,  Prior creatinine 0.99.  Wbc 8.1, Hgb 13.9, Plt 239.  ua wbc TNTC, RBC 6-30.    Pt will be admitted for right sided pain secondary to nephrolithiasis and mild renal insufficiency, and UTI.   Hospital Course:  Principal Problem:   Nephrolithiasis Active Problems:   Renal insufficiency   Right UPJ stone with associate hydronephrosis/UTI -urine culture  + pan sensitive ecoli, blood culture no growth,  -she is started on rocephin since admission, abx changed to  [facility]   -urology  Dr [name] consulted, s/p right ureteral stent on 9/22 -case discussed with Dr [name] today who recommended discharge patient home on abx treatment dose for 7days , then prophylaxis dose nightly until patient return to urology clinic in two weeks for definitive renal stone treatment. -case discussed with pharmacy, patient is discharged on oral keflex bid for 7 days then keflex nightly.  -patient is also started on terazosin per urology recommendation.   Sepsis with leukocytosis, hypotenstion Culture result as noted above She received rocephin and ivf, Leukocytosis/hypotension has resolved.  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   Anxiety: stable, continue home meds ativan prn  Memory loss, report chronic for the last three yrs, she is aaox3. She is advised to follow up with neurology for this. Home health RN arranged.  FTT: pt evel recommended home health, home health PT/RN/social worker arrnaged   Code Status: DNR, confirmed with patient  Family Communication: patient   Disposition Plan: home with urology clearance for discharge   Consultants:  urology  Procedures: right ureteral stent on 10/02/16   Antibiotics:  Rocephin from admission   Discharge Exam: BP (!) 115/46 (BP Location: Left Arm)   Pulse 92   Temp 98.5 F (36.9 C) (Oral)   Resp 18   Ht 5' 7.5" (1.715 m)   Wt 76.5 kg (168 lb 10.4 oz)  SpO2 93%   BMI 26.02 kg/m   General: NAD Cardiovascular: RRR Respiratory: CTABL Neuro: AAOx3  Discharge Instructions You were cared for by a hospitalist during your hospital stay. If you have any questions about your discharge medications or the care you received while you were in the hospital after you are discharged, you can call the unit and asked to speak with the hospitalist  on call if the hospitalist that took care of you is not available. Once you are discharged, your primary care physician will handle any further medical issues. Please note that NO REFILLS for any discharge medications will be authorized once you are discharged, as it is imperative that you return to your primary care physician (or establish a relationship with a primary care physician if you do not have one) for your aftercare needs so that they can reassess your need for medications and monitor your lab values.  Discharge Instructions    Diet general    Complete by:  As directed    Face-to-face encounter (required for Medicare/Medicaid patients)    Complete by:  As directed    I [name] certify that this patient is under my care and that I, or a nurse practitioner or physician's assistant working with me, had a face-to-face encounter that meets the physician face-to-face encounter requirements with this patient on 10/04/2016. The encounter with the patient was in whole, or in part for the following medical condition(s) which is the primary reason for home health care (List medical condition): FTT   The encounter with the patient was in whole, or in part, for the following medical condition, which is the primary reason for home health care:  FTT   I certify that, based on my findings, the following services are medically necessary home health services:   Nursing Physical therapy     Reason for Medically Necessary Home Health Services:  Skilled Nursing- Change/Decline in Patient Status   My clinical findings support the need for the above services:  Shortness of breath with activity   Further, I certify that my clinical findings support that this patient is homebound due to:  Shortness of Breath with activity   Home Health    Complete by:  As directed    To provide the following care/treatments:   PT RN Social work     Increase activity slowly    Complete by:  As directed      Allergies as of  10/04/2016      Reactions   Sulfa Antibiotics    Unknown       Medication List    TAKE these medications   CALTRATE 600+D 600-400 MG-UNIT tablet Generic drug:  Calcium Carbonate-Vitamin D Take 1 tablet by mouth daily.   cephALEXin 500 MG capsule Commonly known as:  KEFLEX Take 1 capsule (500 mg total) by mouth 2 (two) times daily. After finish 7 day of twice a day dosing, start to take once a day nightly until further instruction by urology   latanoprost 0.005 % ophthalmic solution Commonly known as:  XALATAN PLACE 1 DROP INTO BOTH EYES ONCE DAILY   loperamide 2 MG capsule Commonly known as:  IMODIUM Take 2 mg by mouth as needed for diarrhea or loose stools.   LORazepam 0.5 MG tablet Commonly known as:  ATIVAN Take 0.5 mg by mouth 2 (two) times daily as needed for anxiety.   PRESERVISION AREDS 2 Caps Take 1 capsule by mouth 2 (two) times daily.   PREVIDENT 5000 BOOSTER  PLUS 1.1 % Pste Generic drug:  Sodium Fluoride BRUSH AFTER MEALS UTD. DO NOT RINSE   senna-docusate 8.6-50 MG tablet Commonly known as:  Senokot-S Take 1 tablet by mouth at bedtime.   terazosin 1 MG capsule Commonly known as:  HYTRIN Take 1 capsule (1 mg total) by mouth at bedtime.   traMADol 50 MG tablet Commonly known as:  ULTRAM Take 50 mg by mouth every 6 (six) hours as needed for moderate pain or severe pain.            Discharge Care Instructions        Start     Ordered   10/04/16 [place]  (Home health needs / face to face )    Question Answer Comment  To provide the following care/treatments PT   To provide the following care/treatments RN   To provide the following care/treatments Social work      10/04/16 1322   10/04/16 0000  Face-to-face encounter (required for Medicare/Medicaid patients)  (Home health needs / face to face )    Comments:  I Milaina Sher certify that this patient is under my care and that I, or a nurse practitioner or physician's assistant working with  me, had a face-to-face encounter that meets the physician face-to-face encounter requirements with this patient on 10/04/2016. The encounter with the patient was in whole, or in part for the following medical condition(s) which is the primary reason for home health care (List medical condition): FTT  Question Answer Comment  The encounter with the patient was in whole, or in part, for the following medical condition, which is the primary reason for home health care FTT   I certify that, based on my findings, the following services are medically necessary home health services Nursing   I certify that, based on my findings, the following services are medically necessary home health services Physical therapy   Reason for Medically [place]- Change/Decline in Patient Status   My clinical findings support the need for the above services Shortness of breath with activity   Further, I certify that my clinical findings support that this patient is homebound due to: Shortness of Breath with activity      10/04/16 1322   10/04/16 0000  terazosin (HYTRIN) 1 MG capsule  Daily at bedtime     10/04/16 1332   10/04/16 0000  Increase activity slowly     10/04/16 1332   10/04/16 0000  Diet general     10/04/16 1332   10/04/16 0000  cephALEXin (KEFLEX) 500 MG capsule  2 times daily     10/04/16 1412   10/04/16 0000  senna-docusate (SENOKOT-S) 8.6-50 MG tablet  Daily at bedtime     10/04/16 1412     Allergies  Allergen Reactions  . Sulfa Antibiotics     Unknown    Follow-up Information    Lavone Orn, MD Follow up in 1 week(s).   Specialty:  Internal Medicine Why:  hospital discharge follow up, repeat cbc/bmp at follow up. Contact information: [address] [place] [address] [phone]        Irine Seal, MD Follow up.   Specialty:  Urology Contact information: [place] [address] [phone]        [facility] Follow up.   Why:  memory loss for the last three years Contact information: [address]     [place]  [place] [address] [phone]           The results of significant diagnostics from this hospitalization (including imaging, microbiology, ancillary and laboratory) are listed below for reference.    Significant Diagnostic Studies: Ct Abdomen Pelvis Wo Contrast  Result Date: 10/01/2016 CLINICAL DATA:  Acute onset right abdominal pain at 6 a.m. this morning. The pain now radiates into the lower back. Constipation for 1 day. EXAM: CT ABDOMEN AND PELVIS WITHOUT CONTRAST TECHNIQUE: Multidetector CT imaging of the abdomen and pelvis was performed following the standard protocol without IV contrast. COMPARISON:  10/19/2013. FINDINGS: Lower chest: Again demonstrated is a large hiatal hernia containing fat and stomach. 5 mm left lower lobe nodule on image number 9 of series 4 without significant change. Additional previously demonstrated bilateral lung nodules are not currently included. Hepatobiliary: 2 adjacent 4 mm gallstones in the dependent portion of the gallbladder or single 6 mm stone. No gallbladder wall thickening or pericholecystic fluid. Unremarkable liver. Pancreas: Unremarkable. No pancreatic ductal dilatation or surrounding inflammatory changes. Spleen: Normal in size without focal abnormality. Adrenals/Urinary Tract: Moderate dilatation of the right renal collecting system to the level of an 8 mm UPJ calculus. Associated diffuse enlargement of the right kidney with right perinephric soft tissue stranding. Moderately atrophied left kidney. No additional urinary tract calculi or hydronephrosis. Normal appearing adrenal glands. Stomach/Bowel: Large number of colonic diverticula without evidence diverticulitis. No evidence of appendicitis. Unremarkable small bowel. Vascular/Lymphatic: Atheromatous arterial calcifications without aneurysm. No  enlarged lymph nodes. Reproductive: Status post hysterectomy. No adnexal masses. Other: No abdominal wall hernia or abnormality. No abdominopelvic ascites. Musculoskeletal: Right hip prosthesis. Lumbar and lower thoracic spine degenerative changes. These include marked disc space narrowing and vacuum phenomenon at the L2-3 and L4-5 levels and moderate disc space narrowing at the L5-S1 level. There are also facet degenerative changes with associated grade 1 retrolisthesis at the L2-3 and L3-4 levels and grade 1 anterolisthesis at the L4-5 and L5-S1 levels. IMPRESSION: 1. 8 mm right UPJ calculus causing moderate right hydronephrosis. 2. Extensive colonic diverticulosis. 3. Cholelithiasis. 4. Moderate left renal atrophy. Electronically Signed   By: Claudie Revering M.D.   On: 10/01/2016 21:01   Dg Abd 1 View  Result Date: 10/03/2016 CLINICAL DATA:  History of recent ureteral stent placement EXAM: ABDOMEN - 1 VIEW COMPARISON:  10/01/2016 FINDINGS: Scattered large and small bowel gas is noted. A right ureteral stent is noted. The known previously seen right UPJ stone is not as well appreciated on this exam due to overlying bowel gas. Right hip replacement is noted. Degenerative changes of the lumbar spine are seen. IMPRESSION: Right ureteral stent in satisfactory position. Electronically Signed   By: Inez Catalina M.D.   On: 10/03/2016 11:19   Dg C-arm 1-60 Min-no Report  Result Date: 10/02/2016 Fluoroscopy was utilized by the requesting physician.  No radiographic interpretation.    Microbiology: Recent Results (from the past 240 hour(s))  Urine culture     Status: Abnormal (Preliminary result)   Collection Time: 10/01/16  2:59 PM  Result Value Ref Range Status   Specimen Description URINE, RANDOM  Final   Special Requests NONE  Final   Culture (A)  Final    >=100,000 COLONIES/mL ESCHERICHIA COLI CULTURE REINCUBATED FOR BETTER GROWTH Performed at [facility], [address] [place], [place]  [address]    Report Status PENDING  Incomplete   Organism ID, Bacteria ESCHERICHIA COLI (A)  Final      Susceptibility  Escherichia coli - MIC*    AMPICILLIN <=2 SENSITIVE Sensitive     CEFAZOLIN <=4 SENSITIVE Sensitive     CEFTRIAXONE <=1 SENSITIVE Sensitive     CIPROFLOXACIN <=0.25 SENSITIVE Sensitive     GENTAMICIN <=1 SENSITIVE Sensitive     IMIPENEM <=0.25 SENSITIVE Sensitive     NITROFURANTOIN <=16 SENSITIVE Sensitive     TRIMETH/SULFA <=20 SENSITIVE Sensitive     AMPICILLIN/SULBACTAM <=2 SENSITIVE Sensitive     PIP/TAZO <=4 SENSITIVE Sensitive     Extended ESBL NEGATIVE Sensitive     * >=100,000 COLONIES/mL ESCHERICHIA COLI  Culture, blood (routine x 2)     Status: None (Preliminary result)   Collection Time: 10/02/16  1:05 PM  Result Value Ref Range Status   Specimen Description BLOOD RIGHT ANTECUBITAL  Final   Special Requests   Final    BOTTLES DRAWN AEROBIC ONLY Blood Culture adequate volume   Culture   Final    NO GROWTH < 24 HOURS Performed at [facility] [address], [place], [place] [address]    Report Status PENDING  Incomplete  Culture, blood (routine x 2)     Status: None (Preliminary result)   Collection Time: 10/02/16  1:05 PM  Result Value Ref Range Status   Specimen Description BLOOD BLOOD RIGHT HAND  Final   Special Requests IN PEDIATRIC BOTTLE Blood Culture adequate volume  Final   Culture   Final    NO GROWTH < 24 HOURS Performed at [facility], [place] [address] [place], [place] [address]    Report Status PENDING  Incomplete     Labs: Basic Metabolic Panel:  Recent Labs Lab 10/01/16 1652 10/02/16 0417 10/03/16 0436 10/04/16 0452  NA 141 138 139 138  K 3.9 4.9 4.1 4.0  CL 107 107 112* 109  CO2 26 22 21* 20*  GLUCOSE 105* 106* 82 102*  BUN 17 23* 27* 19  CREATININE 1.57* 2.12* 1.58* 1.19*  CALCIUM 9.3 8.1* 7.7* 8.1*   Liver Function Tests:  Recent Labs Lab 10/01/16 1652 10/02/16 0417  AST 31 33  ALT 15 18  ALKPHOS  108 91  BILITOT 1.1 0.7  PROT 6.9 5.4*  ALBUMIN 4.0 3.0*    Recent Labs Lab 10/01/16 1652  LIPASE 20   No results for input(s): AMMONIA in the last 168 hours. CBC:  Recent Labs Lab 10/01/16 1652 10/02/16 0417 10/03/16 0436 10/04/16 0452  WBC 8.1 20.1* 12.7* 10.1  NEUTROABS  --   --  11.0* 8.0*  HGB 13.9 12.0 11.9* 12.5  HCT 42.0 36.8 36.2 37.6  MCV 93.5 94.6 94.5 91.5  PLT 239 189 150 166   Cardiac Enzymes: No results for input(s): CKTOTAL, CKMB, CKMBINDEX, TROPONINI in the last 168 hours. BNP: BNP (last 3 results) No results for input(s): BNP in the last 8760 hours.  ProBNP (last 3 results) No results for input(s): PROBNP in the last 8760 hours.  CBG: No results for input(s): GLUCAP in the last 168 hours.     SignedFlorencia Reasons MD, PhD  [facility] 10/04/2016, 2:19 PM

## 2016-10-04 NOTE — Progress Notes (Signed)
Patient and her daughter given discharge, medication, and follow up instructions, verbalized understanding, IV removed, personal belongings with patient, family to transport home

## 2016-10-04 NOTE — Progress Notes (Signed)
Patient notified RN and c/o SOB along with midsternal chest tightness. Denied chest pain and any radiation. Pt had been resting quietly in bed, but stated she began to feel SOB and tried to ignore that feeling until she "got really scared."Pt was tearful and also stated she felt anxious. 2L/[place] was applied with relief of SOB, but chest tightness did not improve. EKG obtained and VSS. On-call MD made aware- ordered Xanax but pt preferred to take PRN Percocet for back pain.When reassessing pain, pt stated she felt "much better" and didn't hurt anymore. Pt sleeping at this time. Will continue to monitor.

## 2016-10-05 LAB — URINE CULTURE: Culture: >=100000 — AB

## 2016-10-07 LAB — CULTURE, BLOOD (ROUTINE X 2)
CULTURE: NO GROWTH
CULTURE: NO GROWTH
SPECIAL REQUESTS: ADEQUATE
SPECIAL REQUESTS: ADEQUATE

## 2016-10-12 ENCOUNTER — Encounter (HOSPITAL_COMMUNITY): Payer: Self-pay | Admitting: Urology

## 2016-10-12 DIAGNOSIS — R262 Difficulty in walking, not elsewhere classified: Secondary | ICD-10-CM | POA: Diagnosis not present

## 2016-10-12 DIAGNOSIS — R260 Ataxic gait: Secondary | ICD-10-CM | POA: Diagnosis not present

## 2016-10-12 DIAGNOSIS — R296 Repeated falls: Secondary | ICD-10-CM | POA: Diagnosis not present

## 2016-10-12 DIAGNOSIS — M6281 Muscle weakness (generalized): Secondary | ICD-10-CM | POA: Diagnosis not present

## 2016-10-14 NOTE — Progress Notes (Signed)
EKG 10-04-16 epic  CBCdiff, BMP 10-04-16 epic

## 2016-10-14 NOTE — Patient Instructions (Signed)
Caroline Griffith  10/14/2016   Your procedure is scheduled on: 10-19-16  Report to [facility] Main  Entrance Take [place]  elevators to 3rd floor to  [place] [facility].    Call this number if you have problems the morning of surgery [phone]    Remember: ONLY 1 PERSON MAY GO WITH YOU TO SHORT STAY TO GET  READY MORNING OF [place].  Do not eat food or drink liquids :After Midnight.     Take these medicines the morning of surgery with A SIP OF WATER: tylenol if needed, eye drops, ativan if needed                                You may not have any metal on your body including hair pins and              piercings  Do not wear jewelry, make-up, lotions, powders or perfumes, deodorant             Do not wear nail polish.  Do not shave  48 hours prior to surgery.         Do not bring valuables to the hospital. [place].  Contacts, dentures or bridgework may not be worn into surgery.       Patients discharged the day of surgery will not be allowed to drive home.  Name and phone number of your driver:  Special Instructions: N/A              Please read over the following fact sheets you were given: _____________________________________________________________________            [facility] - Preparing for Surgery Before surgery, you can play an important role.  Because skin is not sterile, your skin needs to be as free of germs as possible.  You can reduce the number of germs on your skin by washing with CHG (chlorahexidine gluconate) soap before surgery.  CHG is an antiseptic cleaner which kills germs and bonds with the skin to continue killing germs even after washing. Please DO NOT use if you have an allergy to CHG or antibacterial soaps.  If your skin becomes reddened/irritated stop using the CHG and inform your nurse when you arrive at Short Stay. Do not shave (including legs and underarms) for at  least 48 hours prior to the first CHG shower.  You may shave your face/neck. Please follow these instructions carefully:  1.  Shower with CHG Soap the night before surgery and the  morning of Surgery.  2.  If you choose to wash your hair, wash your hair first as usual with your  normal  shampoo.  3.  After you shampoo, rinse your hair and body thoroughly to remove the  shampoo.                           4.  Use CHG as you would any other liquid soap.  You can apply chg directly  to the skin and wash                       Gently with a scrungie or clean washcloth.  5.  Apply the CHG Soap to your body ONLY FROM THE NECK DOWN.   Do not use on face/ open                           Wound or open sores. Avoid contact with eyes, ears mouth and genitals (private parts).                       Wash face,  Genitals (private parts) with your normal soap.             6.  Wash thoroughly, paying special attention to the area where your surgery  will be performed.  7.  Thoroughly rinse your body with warm water from the neck down.  8.  DO NOT shower/wash with your normal soap after using and rinsing off  the CHG Soap.                9.  Pat yourself dry with a clean towel.            10.  Wear clean pajamas.            11.  Place clean sheets on your bed the night of your first shower and do not  sleep with pets. Day of Surgery : Do not apply any lotions/deodorants the morning of surgery.  Please wear clean clothes to the hospital/surgery center.  FAILURE TO FOLLOW THESE INSTRUCTIONS MAY RESULT IN THE CANCELLATION OF YOUR SURGERY PATIENT SIGNATURE_________________________________  NURSE SIGNATURE__________________________________  ________________________________________________________________________

## 2016-10-15 ENCOUNTER — Encounter (HOSPITAL_COMMUNITY): Payer: Self-pay

## 2016-10-15 ENCOUNTER — Encounter (HOSPITAL_COMMUNITY)
Admission: RE | Admit: 2016-10-15 | Discharge: 2016-10-15 | Disposition: A | Discharge status: Home / Self Care | Payer: PPO | Source: Ambulatory Visit | Attending: Urology | Admitting: Urology

## 2016-10-15 DIAGNOSIS — Z01812 Encounter for preprocedural laboratory examination: Secondary | ICD-10-CM | POA: Insufficient documentation

## 2016-10-15 DIAGNOSIS — N201 Calculus of ureter: Secondary | ICD-10-CM | POA: Diagnosis not present

## 2016-10-19 ENCOUNTER — Inpatient Hospital Stay (HOSPITAL_COMMUNITY)
Admission: AD | Admit: 2016-10-19 | Discharge: 2016-10-27 | DRG: 856 | Disposition: A | Discharge status: Skilled Nursing Facility | Payer: PPO | Source: Ambulatory Visit | Attending: Internal Medicine | Admitting: Internal Medicine

## 2016-10-19 ENCOUNTER — Encounter (HOSPITAL_COMMUNITY): Payer: Self-pay | Admitting: *Deleted

## 2016-10-19 ENCOUNTER — Ambulatory Visit (HOSPITAL_COMMUNITY): Payer: PPO | Admitting: Anesthesiology

## 2016-10-19 ENCOUNTER — Ambulatory Visit (HOSPITAL_COMMUNITY): Payer: PPO

## 2016-10-19 ENCOUNTER — Encounter (HOSPITAL_COMMUNITY)
Admission: AD | Disposition: A | Discharge status: Skilled Nursing Facility | Payer: Self-pay | Source: Ambulatory Visit | Attending: Urology

## 2016-10-19 DIAGNOSIS — I5033 Acute on chronic diastolic (congestive) heart failure: Secondary | ICD-10-CM | POA: Diagnosis not present

## 2016-10-19 DIAGNOSIS — K449 Diaphragmatic hernia without obstruction or gangrene: Secondary | ICD-10-CM | POA: Diagnosis not present

## 2016-10-19 DIAGNOSIS — E860 Dehydration: Secondary | ICD-10-CM | POA: Diagnosis present

## 2016-10-19 DIAGNOSIS — M79641 Pain in right hand: Secondary | ICD-10-CM | POA: Diagnosis not present

## 2016-10-19 DIAGNOSIS — B9689 Other specified bacterial agents as the cause of diseases classified elsewhere: Secondary | ICD-10-CM | POA: Diagnosis not present

## 2016-10-19 DIAGNOSIS — Z9889 Other specified postprocedural states: Secondary | ICD-10-CM

## 2016-10-19 DIAGNOSIS — R1310 Dysphagia, unspecified: Secondary | ICD-10-CM | POA: Diagnosis not present

## 2016-10-19 DIAGNOSIS — N136 Pyonephrosis: Secondary | ICD-10-CM | POA: Diagnosis present

## 2016-10-19 DIAGNOSIS — Z8249 Family history of ischemic heart disease and other diseases of the circulatory system: Secondary | ICD-10-CM | POA: Diagnosis not present

## 2016-10-19 DIAGNOSIS — I959 Hypotension, unspecified: Secondary | ICD-10-CM | POA: Diagnosis not present

## 2016-10-19 DIAGNOSIS — B377 Candidal sepsis: Secondary | ICD-10-CM | POA: Diagnosis present

## 2016-10-19 DIAGNOSIS — T8144XA Sepsis following a procedure, initial encounter: Principal | ICD-10-CM | POA: Diagnosis present

## 2016-10-19 DIAGNOSIS — Z882 Allergy status to sulfonamides status: Secondary | ICD-10-CM

## 2016-10-19 DIAGNOSIS — A419 Sepsis, unspecified organism: Secondary | ICD-10-CM | POA: Diagnosis not present

## 2016-10-19 DIAGNOSIS — R630 Anorexia: Secondary | ICD-10-CM | POA: Diagnosis present

## 2016-10-19 DIAGNOSIS — M79673 Pain in unspecified foot: Secondary | ICD-10-CM | POA: Diagnosis not present

## 2016-10-19 DIAGNOSIS — R52 Pain, unspecified: Secondary | ICD-10-CM

## 2016-10-19 DIAGNOSIS — D649 Anemia, unspecified: Secondary | ICD-10-CM | POA: Diagnosis not present

## 2016-10-19 DIAGNOSIS — G8929 Other chronic pain: Secondary | ICD-10-CM | POA: Diagnosis present

## 2016-10-19 DIAGNOSIS — N390 Urinary tract infection, site not specified: Secondary | ICD-10-CM | POA: Diagnosis not present

## 2016-10-19 DIAGNOSIS — D72829 Elevated white blood cell count, unspecified: Secondary | ICD-10-CM

## 2016-10-19 DIAGNOSIS — Z90710 Acquired absence of both cervix and uterus: Secondary | ICD-10-CM

## 2016-10-19 DIAGNOSIS — B49 Unspecified mycosis: Secondary | ICD-10-CM | POA: Diagnosis present

## 2016-10-19 DIAGNOSIS — G9009 Other idiopathic peripheral autonomic neuropathy: Secondary | ICD-10-CM | POA: Diagnosis not present

## 2016-10-19 DIAGNOSIS — Z466 Encounter for fitting and adjustment of urinary device: Secondary | ICD-10-CM | POA: Diagnosis not present

## 2016-10-19 DIAGNOSIS — E538 Deficiency of other specified B group vitamins: Secondary | ICD-10-CM | POA: Diagnosis not present

## 2016-10-19 DIAGNOSIS — Z6824 Body mass index (BMI) 24.0-24.9, adult: Secondary | ICD-10-CM

## 2016-10-19 DIAGNOSIS — D509 Iron deficiency anemia, unspecified: Secondary | ICD-10-CM | POA: Diagnosis not present

## 2016-10-19 DIAGNOSIS — R911 Solitary pulmonary nodule: Secondary | ICD-10-CM | POA: Diagnosis not present

## 2016-10-19 DIAGNOSIS — Z96651 Presence of right artificial knee joint: Secondary | ICD-10-CM | POA: Diagnosis present

## 2016-10-19 DIAGNOSIS — T83192A Other mechanical complication of urinary stent, initial encounter: Secondary | ICD-10-CM | POA: Diagnosis not present

## 2016-10-19 DIAGNOSIS — Z87442 Personal history of urinary calculi: Secondary | ICD-10-CM | POA: Diagnosis not present

## 2016-10-19 DIAGNOSIS — R0902 Hypoxemia: Secondary | ICD-10-CM | POA: Diagnosis not present

## 2016-10-19 DIAGNOSIS — G629 Polyneuropathy, unspecified: Secondary | ICD-10-CM | POA: Diagnosis present

## 2016-10-19 DIAGNOSIS — T83592A Infection and inflammatory reaction due to indwelling ureteral stent, initial encounter: Secondary | ICD-10-CM | POA: Diagnosis not present

## 2016-10-19 DIAGNOSIS — R509 Fever, unspecified: Secondary | ICD-10-CM | POA: Diagnosis not present

## 2016-10-19 DIAGNOSIS — N183 Chronic kidney disease, stage 3 (moderate): Secondary | ICD-10-CM | POA: Diagnosis not present

## 2016-10-19 DIAGNOSIS — I130 Hypertensive heart and chronic kidney disease with heart failure and stage 1 through stage 4 chronic kidney disease, or unspecified chronic kidney disease: Secondary | ICD-10-CM | POA: Diagnosis not present

## 2016-10-19 DIAGNOSIS — N202 Calculus of kidney with calculus of ureter: Secondary | ICD-10-CM | POA: Diagnosis not present

## 2016-10-19 DIAGNOSIS — M542 Cervicalgia: Secondary | ICD-10-CM | POA: Diagnosis not present

## 2016-10-19 DIAGNOSIS — Z66 Do not resuscitate: Secondary | ICD-10-CM | POA: Diagnosis not present

## 2016-10-19 DIAGNOSIS — R278 Other lack of coordination: Secondary | ICD-10-CM | POA: Diagnosis not present

## 2016-10-19 DIAGNOSIS — N201 Calculus of ureter: Secondary | ICD-10-CM | POA: Diagnosis present

## 2016-10-19 DIAGNOSIS — D519 Vitamin B12 deficiency anemia, unspecified: Secondary | ICD-10-CM | POA: Diagnosis not present

## 2016-10-19 DIAGNOSIS — M1990 Unspecified osteoarthritis, unspecified site: Secondary | ICD-10-CM | POA: Diagnosis not present

## 2016-10-19 DIAGNOSIS — E559 Vitamin D deficiency, unspecified: Secondary | ICD-10-CM | POA: Diagnosis present

## 2016-10-19 DIAGNOSIS — Z87891 Personal history of nicotine dependence: Secondary | ICD-10-CM | POA: Diagnosis not present

## 2016-10-19 DIAGNOSIS — N200 Calculus of kidney: Secondary | ICD-10-CM | POA: Diagnosis not present

## 2016-10-19 DIAGNOSIS — Z96641 Presence of right artificial hip joint: Secondary | ICD-10-CM | POA: Diagnosis present

## 2016-10-19 DIAGNOSIS — K5900 Constipation, unspecified: Secondary | ICD-10-CM | POA: Diagnosis not present

## 2016-10-19 DIAGNOSIS — R2232 Localized swelling, mass and lump, left upper limb: Secondary | ICD-10-CM | POA: Diagnosis not present

## 2016-10-19 DIAGNOSIS — N009 Acute nephritic syndrome with unspecified morphologic changes: Secondary | ICD-10-CM | POA: Diagnosis not present

## 2016-10-19 DIAGNOSIS — R0602 Shortness of breath: Secondary | ICD-10-CM

## 2016-10-19 DIAGNOSIS — R6520 Severe sepsis without septic shock: Secondary | ICD-10-CM | POA: Diagnosis not present

## 2016-10-19 DIAGNOSIS — M25531 Pain in right wrist: Secondary | ICD-10-CM | POA: Diagnosis not present

## 2016-10-19 DIAGNOSIS — D72823 Leukemoid reaction: Secondary | ICD-10-CM | POA: Diagnosis not present

## 2016-10-19 DIAGNOSIS — Z960 Presence of urogenital implants: Secondary | ICD-10-CM | POA: Diagnosis not present

## 2016-10-19 DIAGNOSIS — R609 Edema, unspecified: Secondary | ICD-10-CM

## 2016-10-19 DIAGNOSIS — I361 Nonrheumatic tricuspid (valve) insufficiency: Secondary | ICD-10-CM | POA: Diagnosis not present

## 2016-10-19 DIAGNOSIS — R1312 Dysphagia, oropharyngeal phase: Secondary | ICD-10-CM | POA: Diagnosis not present

## 2016-10-19 DIAGNOSIS — G903 Multi-system degeneration of the autonomic nervous system: Secondary | ICD-10-CM | POA: Diagnosis not present

## 2016-10-19 DIAGNOSIS — M6281 Muscle weakness (generalized): Secondary | ICD-10-CM | POA: Diagnosis not present

## 2016-10-19 HISTORY — PX: CYSTOSCOPY/URETEROSCOPY/HOLMIUM LASER/STENT PLACEMENT: SHX6546

## 2016-10-19 LAB — CBC WITH DIFFERENTIAL/PLATELET
BASOS PCT: 0 %
Basophils Absolute: 0 10*3/uL (ref 0.0–0.1)
EOS ABS: 0 10*3/uL (ref 0.0–0.7)
EOS PCT: 1 %
HCT: 35.4 % — ABNORMAL LOW (ref 36.0–46.0)
HEMOGLOBIN: 11.3 g/dL — AB (ref 12.0–15.0)
Lymphocytes Relative: 12 %
Lymphs Abs: 0.3 10*3/uL — ABNORMAL LOW (ref 0.7–4.0)
MCH: 30.1 pg (ref 26.0–34.0)
MCHC: 31.9 g/dL (ref 30.0–36.0)
MCV: 94.4 fL (ref 78.0–100.0)
MONO ABS: 0 10*3/uL — AB (ref 0.1–1.0)
MONOS PCT: 1 %
NEUTROS PCT: 86 %
Neutro Abs: 1.8 10*3/uL (ref 1.7–7.7)
PLATELETS: 337 10*3/uL (ref 150–400)
RBC: 3.75 MIL/uL — ABNORMAL LOW (ref 3.87–5.11)
RDW: 13.8 % (ref 11.5–15.5)
WBC: 2 10*3/uL — ABNORMAL LOW (ref 4.0–10.5)

## 2016-10-19 LAB — BASIC METABOLIC PANEL
Anion gap: 10 (ref 5–15)
BUN: 9 mg/dL (ref 6–20)
CALCIUM: 8.1 mg/dL — AB (ref 8.9–10.3)
CO2: 26 mmol/L (ref 22–32)
CREATININE: 1.3 mg/dL — AB (ref 0.44–1.00)
Chloride: 104 mmol/L (ref 101–111)
GFR calc non Af Amer: 37 mL/min — ABNORMAL LOW (ref >60)
GFR, EST AFRICAN AMERICAN: 42 mL/min — AB (ref >60)
Glucose, Bld: 80 mg/dL (ref 65–99)
Potassium: 3.8 mmol/L (ref 3.5–5.1)
SODIUM: 140 mmol/L (ref 135–145)

## 2016-10-19 LAB — LACTIC ACID, PLASMA: LACTIC ACID, VENOUS: 1.6 mmol/L (ref 0.5–1.9)

## 2016-10-19 LAB — MRSA PCR SCREENING: MRSA BY PCR: NEGATIVE

## 2016-10-19 SURGERY — CYSTOSCOPY/URETEROSCOPY/HOLMIUM LASER/STENT PLACEMENT
Anesthesia: General | Laterality: Right

## 2016-10-19 MED ORDER — LIDOCAINE 2% (20 MG/ML) 5 ML SYRINGE
INTRAMUSCULAR | Status: DC | PRN
  Administered 2016-10-19: 50 mg via INTRAVENOUS

## 2016-10-19 MED ORDER — CEPHALEXIN 250 MG PO CAPS
250.0000 mg | ORAL_CAPSULE | Freq: Three times a day (TID) | ORAL | Status: DC
  Administered 2016-10-19 (×2): 250 mg via ORAL
  Filled 2016-10-19 (×3): qty 1

## 2016-10-19 MED ORDER — FENTANYL CITRATE (PF) 100 MCG/2ML IJ SOLN
INTRAMUSCULAR | Status: DC | PRN
  Administered 2016-10-19 (×4): 25 ug via INTRAVENOUS

## 2016-10-19 MED ORDER — MIDAZOLAM HCL 2 MG/2ML IJ SOLN
1.0000 mg | Freq: Once | INTRAMUSCULAR | Status: AC
  Administered 2016-10-19: 1 mg via INTRAVENOUS

## 2016-10-19 MED ORDER — SENNOSIDES-DOCUSATE SODIUM 8.6-50 MG PO TABS: 1.0000 | ORAL_TABLET | Freq: Every evening | ORAL | Status: DC | PRN

## 2016-10-19 MED ORDER — ACETAMINOPHEN 10 MG/ML IV SOLN
INTRAVENOUS | Status: AC
  Filled 2016-10-19: qty 100

## 2016-10-19 MED ORDER — ACETAMINOPHEN 325 MG PO TABS
650.0000 mg | ORAL_TABLET | ORAL | Status: DC | PRN
  Administered 2016-10-20: 650 mg via ORAL
  Filled 2016-10-19 (×2): qty 2

## 2016-10-19 MED ORDER — LOPERAMIDE HCL 2 MG PO CAPS: 2.0000 mg | ORAL_CAPSULE | ORAL | Status: DC | PRN

## 2016-10-19 MED ORDER — LATANOPROST 0.005 % OP SOLN
1.0000 [drp] | Freq: Every day | OPHTHALMIC | Status: DC
Start: 2016-10-19 — End: 2016-10-27
  Administered 2016-10-19 – 2016-10-26 (×8): 1 [drp] via OPHTHALMIC
  Filled 2016-10-19 (×2): qty 2.5

## 2016-10-19 MED ORDER — ZOLPIDEM TARTRATE 5 MG PO TABS
5.0000 mg | ORAL_TABLET | Freq: Every evening | ORAL | Status: DC | PRN
  Administered 2016-10-20 – 2016-10-23 (×2): 5 mg via ORAL
  Filled 2016-10-19 (×2): qty 1

## 2016-10-19 MED ORDER — FENTANYL CITRATE (PF) 100 MCG/2ML IJ SOLN
INTRAMUSCULAR | Status: AC
  Filled 2016-10-19: qty 2

## 2016-10-19 MED ORDER — MIDAZOLAM HCL 2 MG/2ML IJ SOLN
INTRAMUSCULAR | Status: AC
Start: 2016-10-19 — End: 2016-10-19
  Filled 2016-10-19: qty 2

## 2016-10-19 MED ORDER — LIDOCAINE 2% (20 MG/ML) 5 ML SYRINGE
INTRAMUSCULAR | Status: AC
  Filled 2016-10-19: qty 5

## 2016-10-19 MED ORDER — LORAZEPAM 0.5 MG PO TABS
0.5000 mg | ORAL_TABLET | Freq: Two times a day (BID) | ORAL | Status: DC | PRN
Start: 2016-10-19 — End: 2016-10-26
  Administered 2016-10-22: 0.5 mg via ORAL
  Filled 2016-10-19 (×2): qty 1

## 2016-10-19 MED ORDER — CEPHALEXIN 500 MG PO CAPS: 500.0000 mg | ORAL_CAPSULE | Freq: Every day | ORAL | 0 refills | Status: DC

## 2016-10-19 MED ORDER — ONDANSETRON HCL 4 MG/2ML IJ SOLN
4.0000 mg | INTRAMUSCULAR | Status: DC | PRN
  Administered 2016-10-20 – 2016-10-25 (×3): 4 mg via INTRAVENOUS
  Filled 2016-10-19 (×3): qty 2

## 2016-10-19 MED ORDER — ONDANSETRON HCL 4 MG/2ML IJ SOLN
INTRAMUSCULAR | Status: DC | PRN
  Administered 2016-10-19: 4 mg via INTRAVENOUS

## 2016-10-19 MED ORDER — TRAMADOL HCL 50 MG PO TABS
50.0000 mg | ORAL_TABLET | Freq: Four times a day (QID) | ORAL | Status: DC | PRN
  Administered 2016-10-20 – 2016-10-25 (×4): 50 mg via ORAL
  Administered 2016-10-27: 100 mg via ORAL
  Filled 2016-10-19 (×6): qty 1

## 2016-10-19 MED ORDER — CIPROFLOXACIN IN D5W 400 MG/200ML IV SOLN
400.0000 mg | Freq: Once | INTRAVENOUS | Status: AC
  Administered 2016-10-19: 400 mg via INTRAVENOUS
  Filled 2016-10-19: qty 200

## 2016-10-19 MED ORDER — HYDROMORPHONE HCL 1 MG/ML IJ SOLN
0.5000 mg | INTRAMUSCULAR | Status: DC | PRN
  Administered 2016-10-19 – 2016-10-20 (×2): 1 mg via INTRAVENOUS
  Filled 2016-10-19 (×2): qty 1

## 2016-10-19 MED ORDER — TERAZOSIN HCL 1 MG PO CAPS
1.0000 mg | ORAL_CAPSULE | Freq: Every day | ORAL | Status: DC
  Administered 2016-10-19: 1 mg via ORAL
  Filled 2016-10-19: qty 1

## 2016-10-19 MED ORDER — SODIUM CHLORIDE 0.9 % IR SOLN
Status: DC | PRN
  Administered 2016-10-19: 4000 mL via INTRAVESICAL

## 2016-10-19 MED ORDER — LACTATED RINGERS IV SOLN
INTRAVENOUS | Status: DC
  Administered 2016-10-19 (×2): via INTRAVENOUS

## 2016-10-19 MED ORDER — PROPOFOL 10 MG/ML IV BOLUS
INTRAVENOUS | Status: DC | PRN
  Administered 2016-10-19: 100 mg via INTRAVENOUS

## 2016-10-19 MED ORDER — FENTANYL CITRATE (PF) 100 MCG/2ML IJ SOLN
25.0000 ug | INTRAMUSCULAR | Status: DC | PRN
  Administered 2016-10-19 (×3): 50 ug via INTRAVENOUS

## 2016-10-19 MED ORDER — FLEET ENEMA 7-19 GM/118ML RE ENEM: 1.0000 | ENEMA | Freq: Once | RECTAL | Status: DC | PRN

## 2016-10-19 MED ORDER — NAPROXEN SODIUM 275 MG PO TABS
275.0000 mg | ORAL_TABLET | Freq: Two times a day (BID) | ORAL | Status: DC | PRN
  Filled 2016-10-19: qty 1

## 2016-10-19 MED ORDER — HYOSCYAMINE SULFATE 0.125 MG SL SUBL
0.1250 mg | SUBLINGUAL_TABLET | SUBLINGUAL | Status: DC | PRN
  Filled 2016-10-19: qty 1

## 2016-10-19 MED ORDER — PROPOFOL 10 MG/ML IV BOLUS
INTRAVENOUS | Status: AC
  Filled 2016-10-19: qty 20

## 2016-10-19 MED ORDER — ONDANSETRON HCL 4 MG/2ML IJ SOLN
INTRAMUSCULAR | Status: AC
  Filled 2016-10-19: qty 2

## 2016-10-19 MED ORDER — DEXTROSE 5 % IV SOLN
1.0000 g | INTRAVENOUS | Status: DC
  Administered 2016-10-19: 1 g via INTRAVENOUS
  Filled 2016-10-19: qty 10

## 2016-10-19 MED ORDER — KCL IN DEXTROSE-NACL 20-5-0.45 MEQ/L-%-% IV SOLN
INTRAVENOUS | Status: DC
  Filled 2016-10-19 (×2): qty 1000

## 2016-10-19 MED ORDER — ACETAMINOPHEN 10 MG/ML IV SOLN
1000.0000 mg | Freq: Once | INTRAVENOUS | Status: AC
  Administered 2016-10-19: 1000 mg via INTRAVENOUS

## 2016-10-19 MED ORDER — CEPHALEXIN 250 MG PO CAPS: 250.0000 mg | ORAL_CAPSULE | Freq: Three times a day (TID) | ORAL | 0 refills | Status: DC

## 2016-10-19 MED ORDER — BISACODYL 10 MG RE SUPP: 10.0000 mg | Freq: Every day | RECTAL | Status: DC | PRN

## 2016-10-19 SURGICAL SUPPLY — 30 items
BAG URINE DRAINAGE (UROLOGICAL SUPPLIES) ×2 IMPLANT
BAG URO CATCHER STRL LF (MISCELLANEOUS) ×3 IMPLANT
BASKET STONE NCOMPASS (UROLOGICAL SUPPLIES) IMPLANT
CATH FOLEY 2WAY SLVR  5CC 16FR (CATHETERS) ×2
CATH FOLEY 2WAY SLVR 5CC 16FR (CATHETERS) IMPLANT
CATH URET 5FR 28IN OPEN ENDED (CATHETERS) IMPLANT
CATH URET DUAL LUMEN 6-10FR 50 (CATHETERS) ×1 IMPLANT
CLOTH BEACON ORANGE TIMEOUT ST (SAFETY) ×3 IMPLANT
COVER FOOTSWITCH UNIV (MISCELLANEOUS) ×2 IMPLANT
COVER SURGICAL LIGHT HANDLE (MISCELLANEOUS) ×1 IMPLANT
EXTRACTOR STONE NITINOL NGAGE (UROLOGICAL SUPPLIES) ×5 IMPLANT
FIBER LASER FLEXIVA 1000 (UROLOGICAL SUPPLIES) IMPLANT
FIBER LASER FLEXIVA 365 (UROLOGICAL SUPPLIES) IMPLANT
FIBER LASER FLEXIVA 550 (UROLOGICAL SUPPLIES) IMPLANT
FIBER LASER TRAC TIP (UROLOGICAL SUPPLIES) ×2 IMPLANT
GLOVE SURG SS PI 8.0 STRL IVOR (GLOVE) ×2 IMPLANT
GOWN STRL REUS W/TWL XL LVL3 (GOWN DISPOSABLE) ×3 IMPLANT
GUIDEWIRE STR DUAL SENSOR (WIRE) ×3 IMPLANT
HOLDER FOLEY CATH W/STRAP (MISCELLANEOUS) ×2 IMPLANT
IV NS 1000ML (IV SOLUTION) ×3
IV NS 1000ML BAXH (IV SOLUTION) ×1 IMPLANT
IV NS IRRIG 3000ML ARTHROMATIC (IV SOLUTION) ×1 IMPLANT
MANIFOLD NEPTUNE II (INSTRUMENTS) ×3 IMPLANT
NS IRRIG 1000ML POUR BTL (IV SOLUTION) ×2 IMPLANT
PACK CYSTO (CUSTOM PROCEDURE TRAY) ×3 IMPLANT
SHEATH ACCESS URETERAL 38CM (SHEATH) ×3 IMPLANT
STENT URET 6FRX24 CONTOUR (STENTS) ×2 IMPLANT
TUBING CONNECTING 10 (TUBING) ×2 IMPLANT
TUBING CONNECTING 10' (TUBING) ×1
WATER STERILE IRR 500ML POUR (IV SOLUTION) ×2 IMPLANT

## 2016-10-19 NOTE — Discharge Instructions (Signed)

## 2016-10-19 NOTE — Progress Notes (Signed)
CBC with diff/ platelet count, B met, Lactic acid, blood cultures x 2 drawn by lab.

## 2016-10-19 NOTE — Progress Notes (Signed)
I was called by PACU regarding a fever to 101.2 and tachycardia which improved with tylenol.    I am going to add Rocephin to broaden her coverage and will check a CBC and CMP.    She will be kept in stepdown overnight.     She was to have been on keflex for prophylaxis until the procedure but apparently that was not continued as expected.

## 2016-10-19 NOTE — Anesthesia Postprocedure Evaluation (Signed)
Anesthesia Post Note  Patient: Caroline Griffith  Procedure(s) Performed: CYSTOSCOPY/URETEROSCOPY/HOLMIUM LASER/STENT EXCHANGE (Right )     Patient location during evaluation: PACU Anesthesia Type: General Level of consciousness: awake and alert Pain management: pain level controlled Vital Signs Assessment: post-procedure vital signs reviewed and stable Respiratory status: spontaneous breathing, nonlabored ventilation, respiratory function stable and patient connected to nasal cannula oxygen Cardiovascular status: blood pressure returned to baseline and stable Postop Assessment: no apparent nausea or vomiting Anesthetic complications: no    Last Vitals:  Vitals:   10/19/16 1330 10/19/16 1353  BP: (!) 106/43 (!) 104/33  Pulse: 88   Resp: 16 18  Temp: (!) 38.3 C (!) 38.4 C  SpO2: 97% 93%    Last Pain:  Vitals:   10/19/16 1330  TempSrc:   PainSc: 0-No pain                 Keyra Virella,W. EDMOND

## 2016-10-19 NOTE — H&P (View-Only) (Signed)
Subjective: CC: Left flank pain.  Hx: Mrs. Pearman is an 83 yo WF who I was asked to see in consultation  by Dr. Florencia Reasons.  She had the onset yesterday of severe right flank pain without nausea or vomiting.  She was seen in the ER where a CT showed an 48mm right proximal stone with obstruction.   Her voided UA looked infected but she had no voiding complaints, hematuria or fever at that time.  I spoke with the ER physician and we discussed getting a cath UA vs admission to medicine for observation.   She was admitted and then developed a leukocytosis and some hypotension and I was called to consider stenting.   She had a prior right renal stone treated with cystoscopy and stenting followed by ESWL in August 2016.  She has begun to have chills but has no other associated signs or symptoms.   ROS:  Review of Systems  Constitutional: Positive for chills.  Respiratory: Positive for shortness of breath.   Cardiovascular: Positive for leg swelling.  Genitourinary: Positive for flank pain.  Skin:       Venous stasis changes.   All other systems reviewed and are negative.   Allergies  Allergen Reactions  . Sulfa Antibiotics     Unknown     Past Medical History:  Diagnosis Date  . Arthritis   . Chronic kidney disease    kidney stones  . History of hiatal hernia   . Macular degeneration   . Pneumonia    hx. of  . Urolithiasis   . Varicose veins     Past Surgical History:  Procedure Laterality Date  . ABDOMINAL HYSTERECTOMY    . BUNIONECTOMY Right   . CYSTOSCOPY WITH STENT PLACEMENT Right 08/15/2014   Procedure: CYSTOSCOPY WITH RIGHT STENT PLACEMENT;  Surgeon: [name], MD;  Location: [facility];  Service: Urology;  Laterality: Right;  . EXTRACORPOREAL SHOCK WAVE LITHOTRIPSY Right 08/18/2014  . JOINT REPLACEMENT Right    Total hip replacement  . JOINT REPLACEMENT Right    Total knee replacement  . ORIF WRIST FRACTURE Left   . TONSILLECTOMY      Social History   Social  History  . Marital status: Widowed    Spouse name: N/A  . Number of children: N/A  . Years of education: N/A   Occupational History  . Not on file.   Social History Main Topics  . Smoking status: Former Smoker    Packs/day: 0.25    Years: 14.00    Types: Cigarettes    Quit date: 01/12/1963  . Smokeless tobacco: Never Used  . Alcohol use 0.0 oz/week     Comment: one drink a night  . Drug use: No  . Sexual activity: Not on file   Other Topics Concern  . Not on file   Social History Narrative  . No narrative on file    Family History  Problem Relation Age of Onset  . Heart disease Mother   . Asthma Mother   . Emphysema Mother        smoked  . Heart disease Father     Anti-infectives: Anti-infectives    Start     Dose/Rate Route Frequency Ordered Stop   10/02/16 2200  cefTRIAXone (ROCEPHIN) 1 g in dextrose 5 % 50 mL IVPB     1 g 100 mL/hr over 30 Minutes Intravenous Every 24 hours 10/02/16 0007     10/01/16 2000  cefTRIAXone (ROCEPHIN) 1 g in  dextrose 5 % 50 mL IVPB     1 g 100 mL/hr over 30 Minutes Intravenous  Once 10/01/16 1949 10/01/16 2149      Current Facility-Administered Medications  Medication Dose Route Frequency Provider Last Rate Last Dose  . 0.9 %  sodium chloride infusion   Intravenous Continuous Jani Gravel, MD 100 mL/hr at 10/02/16 0015    . acetaminophen (TYLENOL) tablet 650 mg  650 mg Oral Q6H PRN Jani Gravel, MD       Or  . acetaminophen (TYLENOL) suppository 650 mg  650 mg Rectal Q6H PRN Jani Gravel, MD      . cefTRIAXone (ROCEPHIN) 1 g in dextrose 5 % 50 mL IVPB  1 g Intravenous Q24H Jani Gravel, MD      . diphenhydrAMINE (BENADRYL) injection 25 mg  25 mg Intravenous Q6H PRN Jani Gravel, MD      . HYDROmorphone (DILAUDID) injection 1 mg  1 mg Intravenous Q6H PRN Jani Gravel, MD      . Derrill Memo ON 10/03/2016] Influenza vac split quadrivalent PF (FLUZONE HIGH-DOSE) injection 0.5 mL  0.5 mL Intramuscular Tomorrow-1000 Jani Gravel, MD      . latanoprost  (XALATAN) 0.005 % ophthalmic solution 1 drop  1 drop Both Eyes QHS Jani Gravel, MD   1 drop at 10/02/16 0030  . LORazepam (ATIVAN) tablet 0.5 mg  0.5 mg Oral BID PRN Jani Gravel, MD      . ondansetron [facility]) injection 4 mg  4 mg Intravenous Q6H PRN Jani Gravel, MD      . oxyCODONE-acetaminophen (PERCOCET/ROXICET) 5-325 MG per tablet 1 tablet  1 tablet Oral Q4H PRN Jani Gravel, MD      . sodium chloride 0.9 % bolus 1,000 mL  1,000 mL Intravenous Once Florencia Reasons, MD 1,000 mL/hr at 10/02/16 1222 1,000 mL at 10/02/16 1222  . terazosin (HYTRIN) capsule 1 mg  1 mg Oral QHS Jani Gravel, MD       Past medical, surgical, social and family history reviewed and updated.  Objective: Vital signs in last 24 hours: Temp:  [98.1 F (36.7 C)-99.8 F (37.7 C)] 98.1 F (36.7 C) (09/22 0610) Pulse Rate:  [79-118] 79 (09/22 0610) Resp:  [16-18] 18 (09/22 0610) BP: (84-134)/(48-86) 92/52 (09/22 0731) SpO2:  [82 %-98 %] 98 % (09/22 0610) Weight:  [74.2 kg (163 lb 8 oz)] 74.2 kg (163 lb 8 oz) (09/22 0025)  Intake/Output from previous day: 09/21 0701 - 09/22 0700 In: 1575 [I.V.:575; IV Piggyback:1000] Out: 0  Intake/Output this shift: Total I/O In: 50 [P.O.:50] Out: -    Physical Exam  Constitutional: She is oriented to person, place, and time and well-developed, well-nourished, and in no distress.  HENT:  Head: Normocephalic and atraumatic.  Neck: Normal range of motion. Neck supple. No thyromegaly present.  Cardiovascular: Normal rate, regular rhythm and normal heart sounds.   Pulmonary/Chest: Effort normal and breath sounds normal.  Abdominal: Soft. Bowel sounds are normal. She exhibits no mass. There is tenderness (right flank). There is no rebound and no guarding.  Musculoskeletal: Normal range of motion. She exhibits no edema or tenderness.  Neurological: She is alert and oriented to person, place, and time.  Skin: Skin is warm and dry.  Venous stasis changes bilateral on the LE's.   Psychiatric:  Mood and affect normal.    Lab Results:   Recent Labs  10/01/16 1652 10/02/16 0417  WBC 8.1 20.1*  HGB 13.9 12.0  HCT 42.0 36.8  PLT 239 189  BMET  Recent Labs  10/01/16 1652 10/02/16 0417  NA 141 138  K 3.9 4.9  CL 107 107  CO2 26 22  GLUCOSE 105* 106*  BUN 17 23*  CREATININE 1.57* 2.12*  CALCIUM 9.3 8.1*   PT/INR  Recent Labs  10/02/16 0417  LABPROT 14.2  INR 1.11   ABG No results for input(s): PHART, HCO3 in the last 72 hours.  Invalid input(s): PCO2, PO2  Studies/Results: Ct Abdomen Pelvis Wo Contrast  Result Date: 10/01/2016 CLINICAL DATA:  Acute onset right abdominal pain at 6 a.m. this morning. The pain now radiates into the lower back. Constipation for 1 day. EXAM: CT ABDOMEN AND PELVIS WITHOUT CONTRAST TECHNIQUE: Multidetector CT imaging of the abdomen and pelvis was performed following the standard protocol without IV contrast. COMPARISON:  10/19/2013. FINDINGS: Lower chest: Again demonstrated is a large hiatal hernia containing fat and stomach. 5 mm left lower lobe nodule on image number 9 of series 4 without significant change. Additional previously demonstrated bilateral lung nodules are not currently included. Hepatobiliary: 2 adjacent 4 mm gallstones in the dependent portion of the gallbladder or single 6 mm stone. No gallbladder wall thickening or pericholecystic fluid. Unremarkable liver. Pancreas: Unremarkable. No pancreatic ductal dilatation or surrounding inflammatory changes. Spleen: Normal in size without focal abnormality. Adrenals/Urinary Tract: Moderate dilatation of the right renal collecting system to the level of an 8 mm UPJ calculus. Associated diffuse enlargement of the right kidney with right perinephric soft tissue stranding. Moderately atrophied left kidney. No additional urinary tract calculi or hydronephrosis. Normal appearing adrenal glands. Stomach/Bowel: Large number of colonic diverticula without evidence diverticulitis. No evidence  of appendicitis. Unremarkable small bowel. Vascular/Lymphatic: Atheromatous arterial calcifications without aneurysm. No enlarged lymph nodes. Reproductive: Status post hysterectomy. No adnexal masses. Other: No abdominal wall hernia or abnormality. No abdominopelvic ascites. Musculoskeletal: Right hip prosthesis. Lumbar and lower thoracic spine degenerative changes. These include marked disc space narrowing and vacuum phenomenon at the L2-3 and L4-5 levels and moderate disc space narrowing at the L5-S1 level. There are also facet degenerative changes with associated grade 1 retrolisthesis at the L2-3 and L3-4 levels and grade 1 anterolisthesis at the L4-5 and L5-S1 levels. IMPRESSION: 1. 8 mm right UPJ calculus causing moderate right hydronephrosis. 2. Extensive colonic diverticulosis. 3. Cholelithiasis. 4. Moderate left renal atrophy. Electronically Signed   By: Claudie Revering M.D.   On: 10/01/2016 21:01   I have discussed her case with Dr. Erlinda Hong and reviewed the hospital notes, labs and CT films and report.     Assessment: Right proximal stone with obstruction and infection.   I am going to get her set up for cystoscopy with right ureteral stenting.   I have reviewed the risks of bleeding, infection, ureteral injury, possible need for a perc, thrombotic events and anesthetic complications.  She will need either ESWL or ureteroscopy to manage the stone once the infection has cleared.     CC: Dr. Florencia Reasons.      Terral Cooks J 10/02/2016 [phone]

## 2016-10-19 NOTE — Brief Op Note (Signed)
10/19/2016  11:48 AM  PATIENT:  Caroline Griffith  83 y.o. female  PRE-OPERATIVE DIAGNOSIS:  RIGHT URETERAL STONE  POST-OPERATIVE DIAGNOSIS:  RIGHT URETERAL STONE  PROCEDURE:  Procedure(s) with comments: CYSTOSCOPY/URETEROSCOPY/HOLMIUM LASER/STENT EXCHANGE (Right) - ONLY NEEDS 60 MINUTES FOR PROCEDURE  SURGEON:  Surgeon(s) and Role:    Irine Seal, MD - Primary  PHYSICIAN ASSISTANT:   ASSISTANTS: none   ANESTHESIA:   general  EBL:  Total I/O In: 1100 [I.V.:1000; IV Piggyback:100] Out: 10 [Blood:10]  BLOOD ADMINISTERED:none  DRAINS: Urinary Catheter (Foley) and 6 x 24 right JJ stent   LOCAL MEDICATIONS USED:  NONE  SPECIMEN:  Source of Specimen:  stone fragments and urine for culture from right renal pelvis.   DISPOSITION OF SPECIMEN:  stone fragments to family, urine culture to path  COUNTS:  YES  TOURNIQUET:  * No tourniquets in log *  DICTATION: .Other Dictation: Dictation Number [id number]  PLAN OF CARE: Admit for overnight observation  PATIENT DISPOSITION:  PACU - hemodynamically stable.   Delay start of Pharmacological VTE agent (>24hrs) due to surgical blood loss or risk of bleeding: not applicable

## 2016-10-19 NOTE — Anesthesia Preprocedure Evaluation (Addendum)
Anesthesia Evaluation  Patient identified by MRN, date of birth, ID band Patient awake    Reviewed: Allergy & Precautions, H&P , NPO status , Patient's Chart, lab work & pertinent test results  Airway Mallampati: II  TM Distance: >3 FB Neck ROM: Full    Dental no notable dental hx. (+) Teeth Intact, Dental Advisory Given   Pulmonary neg pulmonary ROS, former smoker,    Pulmonary exam normal breath sounds clear to auscultation       Cardiovascular negative cardio ROS   Rhythm:Regular Rate:Normal     Neuro/Psych negative neurological ROS  negative psych ROS   GI/Hepatic Neg liver ROS, hiatal hernia,   Endo/Other  negative endocrine ROS  Renal/GU Renal disease  negative genitourinary   Musculoskeletal  (+) Arthritis , Osteoarthritis,    Abdominal   Peds  Hematology negative hematology ROS (+)   Anesthesia Other Findings   Reproductive/Obstetrics negative OB ROS                            Anesthesia Physical Anesthesia Plan  ASA: II  Anesthesia Plan: General   Post-op Pain Management:    Induction: Intravenous  PONV Risk Score and Plan: 4 or greater and Ondansetron, Dexamethasone and Treatment may vary due to age or medical condition  Airway Management Planned: LMA  Additional Equipment:   Intra-op Plan:   Post-operative Plan: Extubation in OR  Informed Consent: I have reviewed the patients History and Physical, chart, labs and discussed the procedure including the risks, benefits and alternatives for the proposed anesthesia with the patient or authorized representative who has indicated his/her understanding and acceptance.   Dental advisory given  Plan Discussed with: CRNA  Anesthesia Plan Comments:         Anesthesia Quick Evaluation

## 2016-10-19 NOTE — Anesthesia Procedure Notes (Signed)
Procedure Name: LMA Insertion Date/Time: 10/19/2016 9:08 AM Performed by: Anne Fu Pre-anesthesia Checklist: Patient identified, Emergency Drugs available, Suction available, Patient being monitored and Timeout performed Patient Re-evaluated:Patient Re-evaluated prior to induction Oxygen Delivery Method: Circle system utilized Preoxygenation: Pre-oxygenation with 100% oxygen Induction Type: IV induction Ventilation: Mask ventilation without difficulty LMA: LMA inserted LMA Size: 3.0 Number of attempts: 1 Placement Confirmation: positive ETCO2 and breath sounds checked- equal and bilateral Tube secured with: Tape

## 2016-10-19 NOTE — Transfer of Care (Signed)
Immediate Anesthesia Transfer of Care Note  Patient: Caroline Griffith  Procedure(s) Performed: Procedure(s) with comments: CYSTOSCOPY/URETEROSCOPY/HOLMIUM LASER/STENT EXCHANGE (Right) - ONLY NEEDS 60 MINUTES FOR PROCEDURE  Patient Location: PACU  Anesthesia Type:General  Level of Consciousness:  sedated, patient cooperative and responds to stimulation  Airway & Oxygen Therapy:Patient Spontanous Breathing and Patient connected to face mask oxgen  Post-op Assessment:  Report given to PACU RN and Post -op Vital signs reviewed and stable  Post vital signs:  Reviewed and stable  Last Vitals:  Vitals:   10/19/16 0646  BP: (!) 143/79  Pulse: 85  Resp: 18  Temp: 36.7 C  SpO2: 66%    Complications: No apparent anesthesia complications

## 2016-10-19 NOTE — Op Note (Signed)
NAMEGRACIA, SAGGESE                ACCOUNT NO.:  [id number]  MEDICAL RECORD NO.:  [id number]  LOCATION:  PERIO                        FACILITY:  [facility]  PHYSICIAN:  Marshall Cork. [name], M.D.    DATE OF BIRTH:  Sep 17, 1930  DATE OF PROCEDURE:  10/19/2016 DATE OF DISCHARGE:                              OPERATIVE REPORT   PROCEDURE:  Cystoscopy with removal of right double-J stent, right ureteroscopy with holmium laser tripsy, insertion of right double-J stent.  PREOPERATIVE DIAGNOSIS:  Right proximal ureteral stone with history of sepsis.  POSTOPERATIVE DIAGNOSIS:  Right proximal ureteral stone with history of sepsis.  SURGEON:  Marshall Cork. [name], M.D.  ANESTHESIA:  General.  SPECIMEN:  Stone fragments and urine culture from right renal pelvis.  DRAINS:  A 6-French x 24 cm right double-J stent and 16-French Foley catheter.  BLOOD LOSS:  Minimal.  COMPLICATIONS:  None.  INDICATIONS:  Ms. Terlecki is an 83 year old white female who was originally admitted in late September to [facility] with a pansensitive E coli urinary infection with an obstructing right proximal stone and sepsis.  She was initially treated with stent placement and was discharged after recovery on Keflex, which was to be continued nightly.  She returns now for stone removal.  FINDINGS AND PROCEDURE:  She was taken to the operating room where general anesthetic was induced.  She was placed in lithotomy position. Her perineum and genitalia were prepped with Betadine solution, and she was draped in usual sterile fashion.  PAS hose was not placed because of the lower extremity ulcers.  Cystoscopy was performed using a 23-French scope and 30-degree lens.  Examination revealed a normal urethra.  The bladder wall had some erythema from stent irritation with cloudy urine. The stent was grasped and pulled to the urethral meatus and a guidewire was then passed to the kidney under fluoroscopic guidance.  The stent was  removed.  A 34 cm 12/14 digital access sheath was then inserted over the wire to the proximal ureter.  The inner core and wire were then removed.  The dual-lumen digital flexible scope was passed without difficulty to the kidney.  The urine was somewhat turbid, so specimen was aspirated and sent for culture.  The stone was identified in the mid pole and moved to the renal pelvis where it was engaged with a 200 micron TracTip laser fiber on 0.5 watts and 10 hertz initially, but the rate was increased to 0.8 watts.  The stone fragmented readily.  The fragments were removed using an NGage basket until no more visible fragments were noted on fluoroscopy or ureteroscopy.  Of note, very minimal irrigation was used during the procedure because of the cloudy urine to be on the safe side.  Once the stone had been removed, a guidewire was passed through the sheath to the kidney.  The sheath was removed and a fresh 6-French 24 cm Contour double-J stent was passed to the kidney under fluoroscopic guidance.  The wire was removed leaving good coil in the bladder and good coil in the kidney.  A 16-French Foley catheter was inserted.  The balloon was filled with 10 mL sterile fluid and the catheter was  placed to straight drainage.  She was then taken down from lithotomy position and moved to recovery room in stable condition.  There were no complications.     Marshall Cork. [name], M.D.     JJW/MEDQ  D:  10/19/2016  T:  10/19/2016  Job:  [id number]

## 2016-10-19 NOTE — Interval H&P Note (Signed)
History and Physical Interval Note:   She returns today for ureteroscopy for definitive management of her right ureteral stone.   10/19/2016 8:20 AM  Caroline Griffith  has presented today for surgery, with the diagnosis of RIGHT URETERAL STONE  The various methods of treatment have been discussed with the patient and family. After consideration of risks, benefits and other options for treatment, the patient has consented to  Procedure(s) with comments: CYSTOSCOPY/URETEROSCOPY/HOLMIUM LASER/STENT EXCHANGE (Right) - ONLY NEEDS 60 MINUTES FOR PROCEDURE as a surgical intervention .  The patient's history has been reviewed, patient examined, no change in status, stable for surgery.  I have reviewed the patient's chart and labs.  Questions were answered to the patient's satisfaction.     [name] J

## 2016-10-20 ENCOUNTER — Inpatient Hospital Stay (HOSPITAL_COMMUNITY): Payer: PPO

## 2016-10-20 DIAGNOSIS — I959 Hypotension, unspecified: Secondary | ICD-10-CM

## 2016-10-20 DIAGNOSIS — D72829 Elevated white blood cell count, unspecified: Secondary | ICD-10-CM

## 2016-10-20 DIAGNOSIS — R0902 Hypoxemia: Secondary | ICD-10-CM

## 2016-10-20 DIAGNOSIS — Z9889 Other specified postprocedural states: Secondary | ICD-10-CM

## 2016-10-20 DIAGNOSIS — D649 Anemia, unspecified: Secondary | ICD-10-CM

## 2016-10-20 LAB — BLOOD CULTURE ID PANEL (REFLEXED)
Acinetobacter baumannii: NOT DETECTED
Candida albicans: DETECTED — AB
Candida glabrata: NOT DETECTED
Candida krusei: NOT DETECTED
Candida parapsilosis: NOT DETECTED
Candida tropicalis: NOT DETECTED
ENTEROCOCCUS SPECIES: NOT DETECTED
ESCHERICHIA COLI: NOT DETECTED
Enterobacter cloacae complex: NOT DETECTED
Enterobacteriaceae species: NOT DETECTED
Haemophilus influenzae: NOT DETECTED
Klebsiella oxytoca: NOT DETECTED
Klebsiella pneumoniae: NOT DETECTED
LISTERIA MONOCYTOGENES: NOT DETECTED
NEISSERIA MENINGITIDIS: NOT DETECTED
PROTEUS SPECIES: NOT DETECTED
PSEUDOMONAS AERUGINOSA: NOT DETECTED
SERRATIA MARCESCENS: NOT DETECTED
STAPHYLOCOCCUS AUREUS BCID: NOT DETECTED
STAPHYLOCOCCUS SPECIES: NOT DETECTED
STREPTOCOCCUS AGALACTIAE: NOT DETECTED
STREPTOCOCCUS PNEUMONIAE: NOT DETECTED
STREPTOCOCCUS PYOGENES: NOT DETECTED
STREPTOCOCCUS SPECIES: NOT DETECTED

## 2016-10-20 LAB — ALBUMIN: Albumin: 2.3 g/dL — ABNORMAL LOW (ref 3.5–5.0)

## 2016-10-20 LAB — BASIC METABOLIC PANEL
Anion gap: 8 (ref 5–15)
BUN: 11 mg/dL (ref 6–20)
CO2: 25 mmol/L (ref 22–32)
Calcium: 7.5 mg/dL — ABNORMAL LOW (ref 8.9–10.3)
Chloride: 105 mmol/L (ref 101–111)
Creatinine, Ser: 1.3 mg/dL — ABNORMAL HIGH (ref 0.44–1.00)
GFR calc Af Amer: 42 mL/min — ABNORMAL LOW (ref >60)
GFR calc non Af Amer: 37 mL/min — ABNORMAL LOW (ref >60)
Glucose, Bld: 121 mg/dL — ABNORMAL HIGH (ref 65–99)
Potassium: 4 mmol/L (ref 3.5–5.1)
Sodium: 138 mmol/L (ref 135–145)

## 2016-10-20 LAB — CBC WITH DIFFERENTIAL/PLATELET
BASOS PCT: 0 %
Basophils Absolute: 0 10*3/uL (ref 0.0–0.1)
EOS ABS: 0.1 10*3/uL (ref 0.0–0.7)
Eosinophils Relative: 0 %
HCT: 33 % — ABNORMAL LOW (ref 36.0–46.0)
Hemoglobin: 10.6 g/dL — ABNORMAL LOW (ref 12.0–15.0)
Lymphocytes Relative: 3 %
Lymphs Abs: 0.4 10*3/uL — ABNORMAL LOW (ref 0.7–4.0)
MCH: 30.2 pg (ref 26.0–34.0)
MCHC: 32.1 g/dL (ref 30.0–36.0)
MCV: 94 fL (ref 78.0–100.0)
MONO ABS: 1.1 10*3/uL — AB (ref 0.1–1.0)
MONOS PCT: 8 %
NEUTROS PCT: 89 %
Neutro Abs: 13.4 10*3/uL — ABNORMAL HIGH (ref 1.7–7.7)
Platelets: 288 10*3/uL (ref 150–400)
RBC: 3.51 MIL/uL — ABNORMAL LOW (ref 3.87–5.11)
RDW: 13.9 % (ref 11.5–15.5)
WBC: 15 10*3/uL — ABNORMAL HIGH (ref 4.0–10.5)

## 2016-10-20 LAB — IRON AND TIBC
IRON: 5 ug/dL — AB (ref 28–170)
SATURATION RATIOS: 4 % — AB (ref 10.4–31.8)
TIBC: 136 ug/dL — AB (ref 250–450)
UIBC: 131 ug/dL

## 2016-10-20 LAB — URINALYSIS, ROUTINE W REFLEX MICROSCOPIC
Bilirubin Urine: NEGATIVE
GLUCOSE, UA: NEGATIVE mg/dL
KETONES UR: NEGATIVE mg/dL
Nitrite: NEGATIVE
PROTEIN: 100 mg/dL — AB
Specific Gravity, Urine: 1.011 (ref 1.005–1.030)
pH: 5 (ref 5.0–8.0)

## 2016-10-20 LAB — LACTIC ACID, PLASMA
Lactic Acid, Venous: 1.6 mmol/L (ref 0.5–1.9)
Lactic Acid, Venous: 1.6 mmol/L (ref 0.5–1.9)

## 2016-10-20 LAB — FERRITIN: Ferritin: 147 ng/mL (ref 11–307)

## 2016-10-20 LAB — VITAMIN B12: VITAMIN B 12: 130 pg/mL — AB (ref 180–914)

## 2016-10-20 LAB — FOLATE: FOLATE: 8.4 ng/mL (ref >5.9)

## 2016-10-20 MED ORDER — ANIDULAFUNGIN 100 MG IV SOLR
200.0000 mg | Freq: Once | INTRAVENOUS | Status: AC
  Administered 2016-10-20: 200 mg via INTRAVENOUS
  Filled 2016-10-20: qty 200

## 2016-10-20 MED ORDER — DEXTROSE 5 % IV SOLN
2.0000 g | Freq: Every day | INTRAVENOUS | Status: DC
  Administered 2016-10-20 – 2016-10-21 (×2): 2 g via INTRAVENOUS
  Filled 2016-10-20 (×2): qty 2

## 2016-10-20 MED ORDER — FENTANYL CITRATE (PF) 100 MCG/2ML IJ SOLN
25.0000 ug | INTRAMUSCULAR | Status: DC | PRN
  Administered 2016-10-20 – 2016-10-21 (×3): 25 ug via INTRAVENOUS
  Filled 2016-10-20 (×3): qty 2

## 2016-10-20 MED ORDER — SODIUM CHLORIDE 0.9 % IV SOLN
100.0000 mg | INTRAVENOUS | Status: DC
  Filled 2016-10-20: qty 100

## 2016-10-20 MED ORDER — NOREPINEPHRINE BITARTRATE 1 MG/ML IV SOLN
0.0000 ug/min | INTRAVENOUS | Status: DC
  Filled 2016-10-20: qty 4

## 2016-10-20 MED ORDER — LACTATED RINGERS IV BOLUS (SEPSIS)
1000.0000 mL | Freq: Once | INTRAVENOUS | Status: AC
  Administered 2016-10-20: 1000 mL via INTRAVENOUS

## 2016-10-20 MED ORDER — SODIUM CHLORIDE 0.9 % IV BOLUS (SEPSIS)
250.0000 mL | Freq: Once | INTRAVENOUS | Status: AC
  Administered 2016-10-20: 250 mL via INTRAVENOUS

## 2016-10-20 MED ORDER — LACTATED RINGERS IV SOLN
INTRAVENOUS | Status: DC
Start: 2016-10-20 — End: 2016-10-23
  Administered 2016-10-20: 16:00:00 via INTRAVENOUS

## 2016-10-20 NOTE — Progress Notes (Signed)
1 Day Post-Op  Subjective: Caroline Griffith is POD 1 from right ureteroscopy with laser and stent for an 11mm stone with a history of an e. Coli UTI with sepsis.   She spike a temp post op and was admitted for management.   She has some nausea this morning but no pain.   Her fever has declined but she is borderline hypotensive.  ROS:  Review of Systems  Respiratory: Negative for shortness of breath.   Cardiovascular: Negative for chest pain.  Gastrointestinal: Negative for nausea.    Anti-infectives: Anti-infectives    Start     Dose/Rate Route Frequency Ordered Stop   10/20/16 0700  cefTRIAXone (ROCEPHIN) 1 g in dextrose 5 % 50 mL IVPB     1 g 100 mL/hr over 30 Minutes Intravenous Every 24 hours 10/20/16 0656     10/19/16 1600  cephALEXin (KEFLEX) capsule 250 mg  Status:  Discontinued     250 mg Oral 3 times daily 10/19/16 1357 10/20/16 0656   10/19/16 1300  cefTRIAXone (ROCEPHIN) 1 g in dextrose 5 % 50 mL IVPB  Status:  Discontinued     1 g 100 mL/hr over 30 Minutes Intravenous Every 24 hours 10/19/16 1200 10/19/16 1412   10/19/16 0615  ciprofloxacin (CIPRO) IVPB 400 mg     400 mg 200 mL/hr over 60 Minutes Intravenous  Once 10/19/16 0604 10/19/16 0845   10/19/16 0000  cephALEXin (KEFLEX) 500 MG capsule  Status:  Discontinued     500 mg Oral Daily at bedtime 10/19/16 1008 10/19/16    10/19/16 0000  cephALEXin (KEFLEX) 250 MG capsule    Comments:  Please take 1 at bedtime after the first 5 days.   250 mg Oral 3 times daily 10/19/16 1145 11/02/16 2359      Current Facility-Administered Medications  Medication Dose Route Frequency Provider Last Rate Last Dose  . acetaminophen (TYLENOL) tablet 650 mg  650 mg Oral Q4H PRN Irine Seal, MD      . bisacodyl (DULCOLAX) suppository 10 mg  10 mg Rectal Daily PRN Irine Seal, MD      . cefTRIAXone (ROCEPHIN) 1 g in dextrose 5 % 50 mL IVPB  1 g Intravenous Q24H Irine Seal, MD      . dextrose 5 % and 0.45 % NaCl with KCl 20 mEq/L infusion   Intravenous  Continuous Irine Seal, MD      . HYDROmorphone (DILAUDID) injection 0.5-1 mg  0.5-1 mg Intravenous Q2H PRN Irine Seal, MD   1 mg at 10/20/16 0225  . hyoscyamine (LEVSIN SL) SL tablet 0.125 mg  0.125 mg Sublingual Q4H PRN Irine Seal, MD      . lactated ringers infusion   Intravenous Continuous Roderic Palau, MD 75 mL/hr at 10/19/16 1100    . latanoprost (XALATAN) 0.005 % ophthalmic solution 1 drop  1 drop Both Eyes QHS Irine Seal, MD   1 drop at 10/19/16 2245  . loperamide (IMODIUM) capsule 2 mg  2 mg Oral PRN Irine Seal, MD      . LORazepam (ATIVAN) tablet 0.5 mg  0.5 mg Oral BID PRN Irine Seal, MD      . naproxen sodium (ANAPROX) tablet 275 mg  275 mg Oral BID PRN Irine Seal, MD      . ondansetron [facility]) injection 4 mg  4 mg Intravenous Q4H PRN Irine Seal, MD      . senna-docusate (Senokot-S) tablet 1 tablet  1 tablet Oral QHS PRN Irine Seal, MD      .  sodium chloride 0.9 % bolus 250 mL  250 mL Intravenous Once Irine Seal, MD      . sodium phosphate (FLEET) 7-19 GM/118ML enema 1 enema  1 enema Rectal Once PRN Irine Seal, MD      . terazosin (HYTRIN) capsule 1 mg  1 mg Oral QHS Irine Seal, MD   1 mg at 10/19/16 2245  . traMADol (ULTRAM) tablet 50-100 mg  50-100 mg Oral Q6H PRN Irine Seal, MD      . zolpidem (AMBIEN) tablet 5 mg  5 mg Oral QHS PRN Irine Seal, MD         Objective: Vital signs in last 24 hours: Temp:  [98 F (36.7 C)-101.2 F (38.4 C)] 99.9 F (37.7 C) (10/09 2316) Pulse Rate:  [61-116] 86 (10/10 0600) Resp:  [11-29] 16 (10/10 0600) BP: (87-198)/(31-169) 94/33 (10/10 0600) SpO2:  [88 %-100 %] 97 % (10/10 0600) Weight:  [71.2 kg (156 lb 15.5 oz)-71.6 kg (157 lb 12.8 oz)] 71.2 kg (156 lb 15.5 oz) (10/09 1330)  Intake/Output from previous day: 10/09 0701 - 10/10 0700 In: 2740 [P.O.:290; I.V.:2300; IV Piggyback:150] Out: 785 [Urine:775; Blood:10] Intake/Output this shift: Total I/O In: 1020 [P.O.:170; I.V.:850] Out: 450 [Urine:450]   Physical  Exam  Constitutional: She is oriented to person, place, and time and well-developed, well-nourished, and in no distress.  Cardiovascular: Normal rate, regular rhythm and normal heart sounds.   Pulmonary/Chest: Effort normal and breath sounds normal. No respiratory distress.  Abdominal: Soft. There is tenderness (mild RUQ).  Musculoskeletal: Normal range of motion. She exhibits no edema or tenderness.  Neurological: She is alert and oriented to person, place, and time.  Skin: Skin is warm and dry.  Vitals reviewed.   Lab Results:   Recent Labs  10/19/16 1225 10/20/16 0317  WBC 2.0* 15.0*  HGB 11.3* 10.6*  HCT 35.4* 33.0*  PLT 337 288   BMET  Recent Labs  10/19/16 1225 10/20/16 0317  NA 140 138  K 3.8 4.0  CL 104 105  CO2 26 25  GLUCOSE 80 121*  BUN 9 11  CREATININE 1.30* 1.30*  CALCIUM 8.1* 7.5*   PT/INR No results for input(s): LABPROT, INR in the last 72 hours. ABG No results for input(s): PHART, HCO3 in the last 72 hours.  Invalid input(s): PCO2, PO2  Studies/Results: Dg C-arm 1-60 Min-no Report  Result Date: 10/19/2016 Fluoroscopy was utilized by the requesting physician.  No radiographic interpretation.   Labs reviewed.   Assessment and Plan: S/p right ureteroscopy with fever.  Blood cultures pending.  Continue Rocephin.  Hypotension.  I will give her a 215ml NS bolus.        LOS: 1 day    Malka So 10/20/2016 [phone] ID: Velna Hatchet, female   DOB: [birth year], 83 y.o.   MRN: [id number]

## 2016-10-20 NOTE — Progress Notes (Signed)
      INFECTIOUS DISEASE ATTENDING ADDENDUM:   Date: 10/20/2016  Patient name: Caroline Griffith  Medical record number: [id number]  Date of birth: 1930-05-04    I was alerted to this patient's presents via the [facility] system. She is fungemic and I am starting ERAXIS  She will need blood central lines removed when and if possible  She will need repeat blood cultures to ensure clearance  She will need investigation for source  She will need a mold dilated funduscopic exam to exclude fungal endophthalmitis.  Dr. Linus Salmons will see for our group tomorrow.  Rhina Brackett Dam 10/20/2016, 5:19 PM

## 2016-10-20 NOTE — Consult Note (Signed)
PULMONARY / CRITICAL CARE MEDICINE   Name: SANORA CUNANAN MRN: [id number] DOB: May 07, 1930    ADMISSION DATE:  10/19/2016 CONSULTATION DATE: 10/10    CHIEF COMPLAINT:  Hypotension  HISTORY OF PRESENT ILLNESS:   I was asked to see the patient for hypotension one day following extraction of a right ureteral stone and stent placement. Her problems began approximately 2 weeks ago when she "felt sick", electively extracted. Postoperatively she has had some slight nausea but no vomiting. She has not had any chest pain and has no history of cardiovascular disease. PAST MEDICAL HISTORY :  She  has a past medical history of Arthritis; Chronic kidney disease; History of hiatal hernia; Macular degeneration; Pneumonia; Urolithiasis; and Varicose veins.  PAST SURGICAL HISTORY: She  has a past surgical history that includes Bunionectomy (Right); Joint replacement (Right); Joint replacement (Right); ORIF wrist fracture (Left); Abdominal hysterectomy; Tonsillectomy; Cystoscopy with stent placement (Right, 08/15/2014); Extracorporeal shock wave lithotripsy (Right, 08/18/2014); Cystoscopy with stent placement (Right, 10/02/2016); and Cystoscopy/ureteroscopy/holmium laser/stent placement (Right, 10/19/2016).  No Known Allergies  No current facility-administered medications on file prior to encounter.    Current Outpatient Prescriptions on File Prior to Encounter  Medication Sig  . latanoprost (XALATAN) 0.005 % ophthalmic solution PLACE 1 DROP INTO BOTH EYES ONCE DAILY  . loperamide (IMODIUM) 2 MG capsule Take 2 mg by mouth as needed for diarrhea or loose stools.  Marland Kitchen LORazepam (ATIVAN) 0.5 MG tablet Take 0.5 mg by mouth 2 (two) times daily as needed for anxiety.  Marland Kitchen PREVIDENT 5000 BOOSTER PLUS 1.1 % PSTE Use once daily as directed  . traMADol (ULTRAM) 50 MG tablet Take 50-100 mg by mouth every 6 (six) hours as needed for moderate pain or severe pain.   Marland Kitchen terazosin (HYTRIN) 1 MG capsule Take 1 capsule (1 mg total)  by mouth at bedtime.    FAMILY HISTORY:  Her indicated that her mother is deceased. She indicated that her father is deceased.    SOCIAL HISTORY: She  reports that she quit smoking about 53 years ago. Her smoking use included Cigarettes. She has a 3.50 pack-year smoking history. She has never used smokeless tobacco. She reports that she drinks about 4.2 oz of alcohol per week . She reports that she does not use drugs.  REVIEW OF SYSTEMS:   10 system review of systems is remarkable for a failing memory. She has no history of cardiovascular illness specifically no chest pain no myocardial infarction, and no arrhythmias. The remainder the review of systems is remarkably negative SUBJECTIVE:  As above VITAL SIGNS: BP (!) 143/80   Pulse 85   Temp 99.2 F (37.3 C) (Oral)   Resp 18   Ht 5' 6.5" (1.689 m)   Wt 156 lb 15.5 oz (71.2 kg)   SpO2 (!) 88%   BMI 24.96 kg/m   HEMODYNAMICS:    VENTILATOR SETTINGS:    INTAKE / OUTPUT: I/O last 3 completed shifts: In: 2740 [P.O.:290; I.V.:2300; IV Piggyback:150] Out: 785 [Urine:775; Blood:10]  PHYSICAL EXAMINATION: General: This is a somewhat chronically ill-appearing appearing 83 year old who is in no acute distress Neuro:  He cannot tell me the date but is otherwise interactive and appropriate. Cardiovascular:  S1 and S2 are regular without murmur rub or gallop. Lungs:  Respirations are unlabored there is symmetric air movement there are no wheezes or rales. Abdomen:  The abdomen is soft without any organomegaly masses guarding or rebound Recent Labs Lab 10/19/16 1225 10/20/16 0317  NA 140 138  K 3.8 4.0  CL 104 105  CO2 26 25  BUN 9 11  CREATININE 1.30* 1.30*  GLUCOSE 80 121*    Electrolytes  Recent Labs Lab 10/19/16 1225 10/20/16 0317  CALCIUM 8.1* 7.5*    CBC  Recent Labs Lab 10/19/16 1225 10/20/16 0317  WBC 2.0* 15.0*  HGB 11.3* 10.6*  HCT 35.4* 33.0*  PLT 337 288    Coag's No results for input(s):  APTT, INR in the last 168 hours.  Sepsis Markers  Recent Labs Lab 10/19/16 1225 10/20/16 1123 10/20/16 1407  LATICACIDVEN 1.6 1.6 1.6    ABG No results for input(s): PHART, PCO2ART, PO2ART in the last 168 hours.  Liver Enzymes  Recent Labs Lab 10/20/16 1330  ALBUMIN 2.3*    Cardiac Enzymes No results for input(s): TROPONINI, PROBNP in the last 168 hours.  Glucose No results for input(s): GLUCAP in the last 168 hours.  Imaging Dg Chest Port 1 View  Result Date: 10/20/2016 CLINICAL DATA:  Hypoxia. EXAM: PORTABLE CHEST 1 VIEW COMPARISON:  06/19/2015. FINDINGS: Trachea is midline. Heart size stable. Thoracic aorta is calcified. There may be new right hilar prominence although the patient is rotated. Linear scarring in the left upper lobe. Lungs are otherwise clear. No pleural fluid. Large hiatal hernia. IMPRESSION: 1. Right hilar prominence may be due to rotation. Difficult to exclude adenopathy or mass. Consider follow-up PA and lateral views of the chest in further initial evaluation. 2. Otherwise, no acute findings in the chest. 3. Large hiatal hernia. Electronically Signed   By: Lorin Picket M.D.   On: 10/20/2016 14:20     STUDIES:    CULTURES: Blood and urine are still pending  Antibiotics Rocephin  DISCUSSION:  This is an 83 year old was undergone laser ablation of a right ureteral stone with stent placement who subsequently has had some difficulties with nausea and marginal blood pressure. She received 2 L of crystalloid before my arrival and her blood pressure was up to 143/80. Her lactate is normal.   ASSESSMENT / PLAN:  CARDIOVASCULAR A: Hypertension. She likely has urinary tract infection but not severe sepsis. I'd continue her Rocephin and liberal fluids as she has had a very poor by mouth intake of recent. In addition I have held her Hytrin. For her pain regimen to when necessary fentanyl which should have less adverse effect on her blood pressure.  Should her pressure failed to hold after adequate volume resuscitation, I would add levo fed and empirically switch her antibiotic to Zosyn for broader gram-negative coverage.       Lars Masson, MD Pulmonary and [place] Pager: [phone]  10/20/2016, 4:04 PM

## 2016-10-20 NOTE — Consult Note (Signed)
Medical Consultation   [name]  OAC:[id number]  DOB: May 17, 1930  DOA: 10/19/2016  PCP: Lavone Orn, MD  Requesting physician: Dr. Irine Seal (urology)  Reason for consultation: hypotension   History of Present Illness: Caroline Griffith is an 83 y.o. female with a history of kidney stones, macular degeneration, varicose veins, OA, presenting after ureteroscopy per urology.  She was admitted due to fever post op.  TRH consulted due to hypotension.    She was recently discharged on 9/24 for a right UPJ stone with associated hydronephrosis and a urinary tract infection with pansensitive Escherichia coli. She was discharged on Keflex for 7 days and then took prophylaxis doses nightly until she followed up with urology. On 10/9 she had ureteroscopy with laser and stent removal and replacement. She was admitted due to fever postop.  [name] currently denies any pain. No vomiting. She generally feels tired, but does not have any other specific complaints. No lightheadedness or dizziness. She is alert and oriented.  Review of Systems:  ROS As per HPI otherwise 10 point review of systems negative.   Past Medical History: Past Medical History:  Diagnosis Date  . Arthritis   . Chronic kidney disease    kidney stones  . History of hiatal hernia   . Macular degeneration   . Pneumonia    hx. of  . Urolithiasis   . Varicose veins    bilateral LE      Past Surgical History: Past Surgical History:  Procedure Laterality Date  . ABDOMINAL HYSTERECTOMY    . BUNIONECTOMY Right   . CYSTOSCOPY WITH STENT PLACEMENT Right 08/15/2014   Procedure: CYSTOSCOPY WITH RIGHT STENT PLACEMENT;  Surgeon: [name], MD;  Location: [facility];  Service: Urology;  Laterality: Right;  . CYSTOSCOPY WITH STENT PLACEMENT Right 10/02/2016   Procedure: CYSTOSCOPY, RIGHT RETROGRADE WITH RIGHT URETERAL STENT PLACEMENT;  Surgeon: Irine Seal, MD;  Location: [facility];  Service: Urology;   Laterality: Right;  . CYSTOSCOPY/URETEROSCOPY/HOLMIUM LASER/STENT PLACEMENT Right 10/19/2016   Procedure: CYSTOSCOPY/URETEROSCOPY/HOLMIUM LASER/STENT EXCHANGE;  Surgeon: Irine Seal, MD;  Location: [facility];  Service: Urology;  Laterality: Right;  ONLY NEEDS 60 MINUTES FOR PROCEDURE  . EXTRACORPOREAL SHOCK WAVE LITHOTRIPSY Right 08/18/2014  . JOINT REPLACEMENT Right    Total hip replacement  . JOINT REPLACEMENT Right    Total knee replacement  . ORIF WRIST FRACTURE Left   . TONSILLECTOMY       Allergies:  No Known Allergies   Social History:  reports that she quit smoking about 53 years ago. Her smoking use included Cigarettes. She has a 3.50 pack-year smoking history. She has never used smokeless tobacco. She reports that she drinks about 4.2 oz of alcohol per week . She reports that she does not use drugs.   Family History: Family History  Problem Relation Age of Onset  . Heart disease Mother   . Asthma Mother   . Emphysema Mother        smoked  . Heart disease Father     Physical Exam: Vitals:   10/20/16 0900 10/20/16 0954 10/20/16 1000 10/20/16 1200  BP: (!) 90/27  (!) 81/31   Pulse: 90  89   Resp: 14  17   Temp:  99.1 F (37.3 C)  99.8 F (37.7 C)  TempSrc:  Oral  Oral  SpO2: 92%  90%   Weight:      Height:  Constitutional:  Alert and awake, oriented x3, not in any acute distress. Eyes: PERLA, EOMI, irises appear normal, anicteric sclera,  ENMT: external ears and nose appear normal            Lips appears normal, oropharynx mucosa, tongue, posterior pharynx appear normal  Neck: neck appears normal, no masses, normal ROM, no thyromegaly, no JVD  CVS: S1-S2 clear, no murmur rubs or gallops, no LE edema, normal pedal pulses  Respiratory:  clear to auscultation bilaterally, no wheezing, rales or rhonchi. Respiratory effort normal. No accessory muscle use.  Abdomen: soft nontender, nondistended, normal bowel sounds, no hepatosplenomegaly, no hernias  No CVA  tenderness Musculoskeletal: : no cyanosis, clubbing or edema noted bilaterally Neuro: Cranial nerves II-XII grossly intact, symmetric movement of arms legs Psych: judgement and insight appear normal, stable mood and affect, mental status Skin: no rashes or lesions or ulcers, no induration or nodules   Data reviewed:  I have personally reviewed following labs and imaging studies Labs:  CBC:  Recent Labs Lab 10/19/16 1225 10/20/16 0317  WBC 2.0* 15.0*  NEUTROABS 1.8 13.4*  HGB 11.3* 10.6*  HCT 35.4* 33.0*  MCV 94.4 94.0  PLT 337 403    Basic Metabolic Panel:  Recent Labs Lab 10/19/16 1225 10/20/16 0317  NA 140 138  K 3.8 4.0  CL 104 105  CO2 26 25  GLUCOSE 80 121*  BUN 9 11  CREATININE 1.30* 1.30*  CALCIUM 8.1* 7.5*   GFR Estimated Creatinine Clearance: 30.8 mL/min (A) (by C-G formula based on SCr of 1.3 mg/dL (H)). Liver Function Tests: No results for input(s): AST, ALT, ALKPHOS, BILITOT, PROT, ALBUMIN in the last 168 hours. No results for input(s): LIPASE, AMYLASE in the last 168 hours. No results for input(s): AMMONIA in the last 168 hours. Coagulation profile No results for input(s): INR, PROTIME in the last 168 hours.  Cardiac Enzymes: No results for input(s): CKTOTAL, CKMB, CKMBINDEX, TROPONINI in the last 168 hours. BNP: Invalid input(s): POCBNP CBG: No results for input(s): GLUCAP in the last 168 hours. D-Dimer No results for input(s): DDIMER in the last 72 hours. Hgb A1c No results for input(s): HGBA1C in the last 72 hours. Lipid Profile No results for input(s): CHOL, HDL, LDLCALC, TRIG, CHOLHDL, LDLDIRECT in the last 72 hours. Thyroid function studies No results for input(s): TSH, T4TOTAL, T3FREE, THYROIDAB in the last 72 hours.  Invalid input(s): FREET3 Anemia work up No results for input(s): VITAMINB12, FOLATE, FERRITIN, TIBC, IRON, RETICCTPCT in the last 72 hours. Urinalysis    Component Value Date/Time   COLORURINE YELLOW 10/01/2016 1459     APPEARANCEUR CLOUDY (A) 10/01/2016 1459   LABSPEC 1.014 10/01/2016 1459   PHURINE 5.0 10/01/2016 1459   GLUCOSEU NEGATIVE 10/01/2016 1459   HGBUR MODERATE (A) 10/01/2016 1459   BILIRUBINUR NEGATIVE 10/01/2016 1459   KETONESUR 5 (A) 10/01/2016 1459   PROTEINUR NEGATIVE 10/01/2016 1459   NITRITE NEGATIVE 10/01/2016 1459   LEUKOCYTESUR LARGE (A) 10/01/2016 1459     Microbiology Recent Results (from the past 240 hour(s))  Culture, blood (Routine X 2) w Reflex to ID Panel     Status: None (Preliminary result)   Collection Time: 10/19/16 12:33 PM  Result Value Ref Range Status   Specimen Description BLOOD LEFT HAND  Final   Special Requests IN PEDIATRIC BOTTLE Blood Culture adequate volume  Final   Culture   Final    NO GROWTH < 24 HOURS Performed at [facility], [place] [address],  [place], [place] [address]    Report Status PENDING  Incomplete  Culture, blood (Routine X 2) w Reflex to ID Panel     Status: None (Preliminary result)   Collection Time: 10/19/16 12:34 PM  Result Value Ref Range Status   Specimen Description BLOOD RIGHT ARM  Final   Special Requests   Final    BOTTLES DRAWN AEROBIC AND ANAEROBIC Blood Culture adequate volume   Culture   Final    NO GROWTH < 24 HOURS Performed at [facility], [address], [place], [place] [address]    Report Status PENDING  Incomplete  MRSA PCR Screening     Status: None   Collection Time: 10/19/16  1:56 PM  Result Value Ref Range Status   MRSA by PCR NEGATIVE NEGATIVE Final    Comment:        The GeneXpert MRSA Assay (FDA approved for NASAL specimens only), is one component of a comprehensive MRSA colonization surveillance program. It is not intended to diagnose MRSA infection nor to guide or monitor treatment for MRSA infections.        Inpatient Medications:   Scheduled Meds: . latanoprost  1 drop Both Eyes QHS  . terazosin  1 mg Oral QHS   Continuous Infusions: . cefTRIAXone (ROCEPHIN)  IV  Stopped (10/20/16 0830)  . dextrose 5 % and 0.45 % NaCl with KCl 20 mEq/L    . lactated ringers    . lactated ringers 75 mL/hr at 10/19/16 1100     Radiological Exams on Admission: Dg C-arm 1-60 Min-no Report  Result Date: 10/19/2016 Fluoroscopy was utilized by the requesting physician.  No radiographic interpretation.    Impression/Recommendations Active Problems:   Right ureteral calculus   H/O cystoscopy   Leukocytosis   Hypotension   Anemia   Hypocalcemia  Right ureteral calculus s/p cystoscopy  Sepsis  Hypotension:   Febrile, hypotensive, leukocytosis Recently admitted to [facility] with 8 mm R UPJ calculus.  Had R ureteral stent placed on 9/22.  Treated with keflex x7 days, then nightly keflex until follow up.  10/9 had ureteroscopy with laser and stent removal and placement.  She was admitted after spiking temp postoperatively and started on ceftriaxone.   Hospitalist consulted with hypotension on 10/10.   S/p 250 cc IVF this morning.  Additional 2 L of LR ordered (which is about 30 cc/kg).  Nurse notes she was responsive with 250 cc bolus earlier. Agree with increase in ceftriaxone to 2 g daily (urine 9/21 with pansensitive e coli) Bcx pending from 10/9 Unfortunately urine sent for cytology instead of micro yesterday, pt has already received abx, will order repeat ua/cx, though unlikely to be helpful unless resistance Lactate ordered If patient not fluid responsive will discuss with PCCM  Addendum: discussed with PCCM after not initially responsive after IVF, they will see  Addendum Fungemia: found to have yeast growing in 1/2 bcx.  ID has dropped note and will see tomorrow.   Eraxis per ID ID to see tomorrow Source seems to be urinary with cytology with fungal organisms c/w candida Had PICC order placed given possible need for pressors, but d/c'd this order with improvement in BP's   Leukocytosis:  Likely due to above (of note, WBC was 2 on day of  presentation) CTM  Hypoxia: CXR.  Mild, no concerning lung exam findings.  Encouraged IS which is in room.    CKD:  Unclear baseline, ~1 in 2016, most recently increased in setting of sepsis  and stones (as low as 1.19 on 9/24 which is CrCl ~40).  1.3 on admission, stable.   CTM  Anemia, normocytic: H/H 10.6, decreased from previous admission.   Daily H/H Iron/TIBC, ferritin (likely elevated in setting of above) B12, folate  Hypocalcemia: f/u albumin for correction  Code status: Discussed code status with patient, she is DNR, but would be ok with pressors or temporary intubation if needed.  No CPR.   Thank you for this consultation.  Our [facility] hospitalist team will follow the patient with you.  Time Spent: Over 1 hour  Fayrene Helper M.D. [facility] 10/20/2016, 1:13 PM

## 2016-10-20 NOTE — Care Management Note (Signed)
Case Management Note  Patient Details  Name: Caroline Griffith MRN: [id number] Date of Birth: 12-Sep-1930  Subjective/Objective:                  POD 1 from right ureteroscopy with laser and stent for an 28mm stone with a history of an e. Coli UTI with sepsis.   She spike a temp post op and was admitted for management.   Action/Plan: Date:  October 20, 2016 Chart reviewed for concurrent status and case management needs.  Will continue to follow patient progress.  Discharge Planning: following for needs  Expected discharge date: [id number]  Velva Harman, BSN, [place], [place]   Expected Discharge Date:                  Expected Discharge Plan:  Home/Self Care  In-House Referral:     Discharge planning Services  CM Consult  Post Acute Care Choice:  Resumption of Svcs/PTA Provider Choice offered to:     DME Arranged:    DME Agency:     HH Arranged:    [place] Agency:     Status of Service:  In process, will continue to follow  If discussed at Long Length of Stay Meetings, dates discussed:    Additional Comments:  Leeroy Cha, RN 10/20/2016, 8:35 AM

## 2016-10-20 NOTE — Progress Notes (Signed)
Patient ID: Caroline Griffith, female   DOB: 1930-08-16, 83 y.o.   MRN: [id number]  Willacy and ID consults.    Cytology had yeast as did her blood culture.  Treatment Eraxis initiated by Dr. Tommy Medal.  BP has improved with fluid bolus.    BP (!) 124/44   Pulse 88   Temp 99.2 F (37.3 C) (Oral)   Resp 20   Ht 5' 6.5" (1.689 m)   Wt 71.2 kg (156 lb 15.5 oz)   SpO2 95%   BMI 24.96 kg/m

## 2016-10-20 NOTE — Progress Notes (Signed)
Patient has been hypotensive with SBP in the low 90's and MAP in the 40's. Paged Dr. on call. Awaiting further instructions. Pt. is asymptomatic, afebrile , in sinus rhythm and resting comfortably.

## 2016-10-20 NOTE — Progress Notes (Signed)
PHARMACY - PHYSICIAN COMMUNICATION CRITICAL VALUE ALERT - BLOOD CULTURE IDENTIFICATION (BCID)  Results for orders placed or performed during the hospital encounter of 10/19/16  Blood Culture ID Panel (Reflexed) (Collected: 10/19/2016 12:34 PM)  Result Value Ref Range   Enterococcus species NOT DETECTED NOT DETECTED   Listeria monocytogenes NOT DETECTED NOT DETECTED   Staphylococcus species NOT DETECTED NOT DETECTED   Staphylococcus aureus NOT DETECTED NOT DETECTED   Streptococcus species NOT DETECTED NOT DETECTED   Streptococcus agalactiae NOT DETECTED NOT DETECTED   Streptococcus pneumoniae NOT DETECTED NOT DETECTED   Streptococcus pyogenes NOT DETECTED NOT DETECTED   Acinetobacter baumannii NOT DETECTED NOT DETECTED   Enterobacteriaceae species NOT DETECTED NOT DETECTED   Enterobacter cloacae complex NOT DETECTED NOT DETECTED   Escherichia coli NOT DETECTED NOT DETECTED   Klebsiella oxytoca NOT DETECTED NOT DETECTED   Klebsiella pneumoniae NOT DETECTED NOT DETECTED   Proteus species NOT DETECTED NOT DETECTED   Serratia marcescens NOT DETECTED NOT DETECTED   Haemophilus influenzae NOT DETECTED NOT DETECTED   Neisseria meningitidis NOT DETECTED NOT DETECTED   Pseudomonas aeruginosa NOT DETECTED NOT DETECTED   Candida albicans DETECTED (A) NOT DETECTED   Candida glabrata NOT DETECTED NOT DETECTED   Candida krusei NOT DETECTED NOT DETECTED   Candida parapsilosis NOT DETECTED NOT DETECTED   Candida tropicalis NOT DETECTED NOT DETECTED    Name of physician (or Provider) Contacted:  Dr. Tommy Medal  Changes to prescribed antibiotics required: Patient already got dose of Eraxis today.  Cont with Eraxis for now.  Lynelle Doctor 10/20/2016  7:08 PM

## 2016-10-21 DIAGNOSIS — Z960 Presence of urogenital implants: Secondary | ICD-10-CM

## 2016-10-21 DIAGNOSIS — N201 Calculus of ureter: Secondary | ICD-10-CM

## 2016-10-21 DIAGNOSIS — B49 Unspecified mycosis: Secondary | ICD-10-CM

## 2016-10-21 DIAGNOSIS — B9689 Other specified bacterial agents as the cause of diseases classified elsewhere: Secondary | ICD-10-CM

## 2016-10-21 DIAGNOSIS — D72823 Leukemoid reaction: Secondary | ICD-10-CM

## 2016-10-21 DIAGNOSIS — Z87442 Personal history of urinary calculi: Secondary | ICD-10-CM

## 2016-10-21 DIAGNOSIS — G903 Multi-system degeneration of the autonomic nervous system: Secondary | ICD-10-CM

## 2016-10-21 LAB — BASIC METABOLIC PANEL
ANION GAP: 7 (ref 5–15)
BUN: 10 mg/dL (ref 6–20)
CALCIUM: 7.5 mg/dL — AB (ref 8.9–10.3)
CO2: 24 mmol/L (ref 22–32)
CREATININE: 1.06 mg/dL — AB (ref 0.44–1.00)
Chloride: 105 mmol/L (ref 101–111)
GFR calc Af Amer: 54 mL/min — ABNORMAL LOW (ref >60)
GFR, EST NON AFRICAN AMERICAN: 47 mL/min — AB (ref >60)
GLUCOSE: 84 mg/dL (ref 65–99)
Potassium: 3.7 mmol/L (ref 3.5–5.1)
Sodium: 136 mmol/L (ref 135–145)

## 2016-10-21 LAB — CBC
HCT: 31.9 % — ABNORMAL LOW (ref 36.0–46.0)
Hemoglobin: 10.3 g/dL — ABNORMAL LOW (ref 12.0–15.0)
MCH: 30.5 pg (ref 26.0–34.0)
MCHC: 32.3 g/dL (ref 30.0–36.0)
MCV: 94.4 fL (ref 78.0–100.0)
PLATELETS: 210 10*3/uL (ref 150–400)
RBC: 3.38 MIL/uL — ABNORMAL LOW (ref 3.87–5.11)
RDW: 14.2 % (ref 11.5–15.5)
WBC: 12.2 10*3/uL — AB (ref 4.0–10.5)

## 2016-10-21 LAB — URINE CULTURE

## 2016-10-21 MED ORDER — NYSTATIN 100000 UNIT/ML MT SUSP
5.0000 mL | Freq: Four times a day (QID) | OROMUCOSAL | Status: DC
  Administered 2016-10-21 – 2016-10-27 (×18): 500000 [IU] via ORAL
  Filled 2016-10-21 (×19): qty 5

## 2016-10-21 MED ORDER — FLUCONAZOLE IN SODIUM CHLORIDE 200-0.9 MG/100ML-% IV SOLN
200.0000 mg | INTRAVENOUS | Status: DC
  Administered 2016-10-22: 200 mg via INTRAVENOUS
  Filled 2016-10-21: qty 100

## 2016-10-21 MED ORDER — ENOXAPARIN SODIUM 40 MG/0.4ML ~~LOC~~ SOLN
40.0000 mg | SUBCUTANEOUS | Status: DC
  Administered 2016-10-21 – 2016-10-26 (×6): 40 mg via SUBCUTANEOUS
  Filled 2016-10-21 (×7): qty 0.4

## 2016-10-21 MED ORDER — FLUCONAZOLE IN SODIUM CHLORIDE 400-0.9 MG/200ML-% IV SOLN: 800.0000 mg | Freq: Once | INTRAVENOUS | Status: DC

## 2016-10-21 MED ORDER — CYANOCOBALAMIN 1000 MCG/ML IJ SOLN
1000.0000 ug | Freq: Every morning | INTRAMUSCULAR | Status: DC
  Administered 2016-10-21 – 2016-10-22 (×2): 1000 ug via INTRAMUSCULAR
  Filled 2016-10-21 (×2): qty 1

## 2016-10-21 MED ORDER — SODIUM CHLORIDE 0.9 % IV SOLN
INTRAVENOUS | Status: DC
  Administered 2016-10-21 – 2016-10-22 (×2): via INTRAVENOUS

## 2016-10-21 MED ORDER — FLUCONAZOLE IN SODIUM CHLORIDE 400-0.9 MG/200ML-% IV SOLN: 400.0000 mg | INTRAVENOUS | Status: DC

## 2016-10-21 MED ORDER — FLUCONAZOLE IN SODIUM CHLORIDE 400-0.9 MG/200ML-% IV SOLN
400.0000 mg | Freq: Once | INTRAVENOUS | Status: AC
  Administered 2016-10-21: 400 mg via INTRAVENOUS
  Filled 2016-10-21: qty 200

## 2016-10-21 MED ORDER — VITAMIN B-12 1000 MCG PO TABS: 1000.0000 ug | ORAL_TABLET | Freq: Every day | ORAL | Status: DC

## 2016-10-21 NOTE — Progress Notes (Signed)
PHARMACY NOTE:  ANTIMICROBIAL RENAL DOSAGE ADJUSTMENT  Current antimicrobial regimen includes a mismatch between antimicrobial dosage and estimated renal function.  As per policy approved by the Pharmacy & Therapeutics and Medical Executive Committees, the antimicrobial dosage will be adjusted accordingly.  Current antimicrobial dosage:  Fluconazole 800 mg IV x1, then 400 mg IV q24h  Indication: candida albicans fungemia  Renal Function:  Estimated Creatinine Clearance: 37.7 mL/min (A) (by C-G formula based on SCr of 1.06 mg/dL (H)). []      On intermittent HD, scheduled: []      On CRRT    Antimicrobial dosage has been changed to:  Fluconazole 400 mg IV x1, then 200 mg IV daily  Additional comments:   Thank you for allowing pharmacy to be a part of this patient's care.  Lynelle Doctor, [facility] 10/21/2016 2:48 PM

## 2016-10-21 NOTE — Progress Notes (Signed)
Name: Caroline Griffith MRN:   [id number] DOB:   09-12-1930           ADMISSION DATE:  10/19/2016     Brief summary Caroline Griffith is an 83 y.o. female with a history of kidney stones, macular degeneration, varicose veins, OA, presenting after ureteroscopy per urology.  She was admitted due to fever post op.  TRH consulted due to hypotension.    She was recently discharged on 9/24 for a right UPJ stone with associated hydronephrosis and a urinary tract infection with pansensitive Escherichia coli. She was discharged on Keflex for 7 days and then took prophylaxis doses nightly until she followed up with urology. On 10/9 she had ureteroscopy with laser and stent removal and replacement. She was admitted due to fever postop and hypotension.  Assessment and plan Right ureteral calculus s/p cystoscopy  fungal Sepsis  Hypotension:   Febrile, hypotensive, leukocytosis Recently admitted to [facility] with 8 mm R UPJ calculus.  Had R ureteral stent placed on 9/22.  Treated with keflex x7 days, then nightly keflex until follow up.  10/9 had ureteroscopy with laser and stent removal and placement.  She was admitted after spiking temp postoperatively and started on ceftriaxone.   Hospitalist consulted with hypotension on 10/10.  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 Patient followed by infectious disease, started on ERAXIS and also receiving Rocephin Bcx pending from 10/9,  repeat blood cultures ordered,  currently patient has no central lines or PICC line        Fungemia: found to have yeast growing in 1/2 bcx.  ID has dropped note and will see tomorrow.   Eraxis per ID ID  Following She will need a mold dilated funduscopic exam to exclude fungal endophthalmitis    Leukocytosis:   slowly improving down from 15-12.2    Hypoxia: CXR.  Mild, no concerning lung exam findings.  Encouraged IS which is in room.    CKD:  Unclear baseline, ~1  in 2016, most recently increased in setting of sepsis and stones (as low as 1.19 on 9/24 which is CrCl ~40).  1.3 on admission, stable.      Anemia, normocytic:/vitamin B-12 deficiency  H/H 10.6, decreased from previous admission.   Hemoglobin 10.3 Iron/TIBC, ferritin (likely elevated in setting of above) vitamin B-12 , 130, will start repletion  Hypocalcemia:  Corrected calcium is 8.8,  this is borderline low. This may be monitored, it drops further and will start calcium supplementation. Check vitamin D level  Code status: Discussed code status with patient, she is DNR  Disposition-pending further evaluation by ID and recommendations      Physical Exam: Vitals:   10/21/16 0600 10/21/16 0800  BP: (!) 137/52   Pulse: 80   Resp: (!) 21   Temp:  98.9 F (37.2 C)  SpO2: 97%      Constitutional:  Alert and awake, oriented x3, not in any acute distress. Eyes: PERLA, EOMI, irises appear normal, anicteric sclera,  ENMT: external ears and nose appear normal            Lips appears normal, oropharynx mucosa, tongue, posterior pharynx appear normal  Neck: neck appears normal, no masses, normal ROM, no thyromegaly, no JVD  CVS: S1-S2 clear, no murmur rubs or gallops, no LE edema, normal pedal pulses  Respiratory:  clear to auscultation bilaterally, no wheezing, rales or rhonchi. Respiratory effort normal. No accessory muscle use.  Abdomen: soft nontender, nondistended,  normal bowel sounds, no hepatosplenomegaly, no hernias  No CVA tenderness Musculoskeletal: : no cyanosis, clubbing or edema noted bilaterally Neuro: Cranial nerves II-XII grossly intact, symmetric movement of arms legs Psych: judgement and insight appear normal, stable mood and affect, mental status Skin: no rashes or lesions or ulcers, no induration or nodules   Data reviewed:  I have personally reviewed following labs and imaging studies Labs:  CBC:  Last Labs    Recent Labs Lab 10/19/16 1225  10/20/16 0317  WBC 2.0* 15.0*  NEUTROABS 1.8 13.4*  HGB 11.3* 10.6*  HCT 35.4* 33.0*  MCV 94.4 94.0  PLT 337 288      Basic Metabolic Panel:  Last Labs    Recent Labs Lab 10/19/16 1225 10/20/16 0317  NA 140 138  K 3.8 4.0  CL 104 105  CO2 26 25  GLUCOSE 80 121*  BUN 9 11  CREATININE 1.30* 1.30*  CALCIUM 8.1* 7.5*     GFR Estimated Creatinine Clearance: 30.8 mL/min (A) (by C-G formula based on SCr of 1.3 mg/dL (H)). Liver Function Tests: Last Labs   No results for input(s): AST, ALT, ALKPHOS, BILITOT, PROT, ALBUMIN in the last 168 hours.   Last Labs   No results for input(s): LIPASE, AMYLASE in the last 168 hours.   Last Labs   No results for input(s): AMMONIA in the last 168 hours.   Coagulation profile Last Labs   No results for input(s): INR, PROTIME in the last 168 hours.    Cardiac Enzymes: Last Labs   No results for input(s): CKTOTAL, CKMB, CKMBINDEX, TROPONINI in the last 168 hours.   BNP: Last Labs   Invalid input(s): POCBNP   CBG: Last Labs   No results for input(s): GLUCAP in the last 168 hours.   D-Dimer Recent Labs (last 2 labs)   No results for input(s): DDIMER in the last 72 hours.   Hgb A1c Recent Labs (last 2 labs)   No results for input(s): HGBA1C in the last 72 hours.   Lipid Profile Recent Labs (last 2 labs)   No results for input(s): CHOL, HDL, LDLCALC, TRIG, CHOLHDL, LDLDIRECT in the last 72 hours.   Thyroid function studies  Recent Labs (last 2 labs)   No results for input(s): TSH, T4TOTAL, T3FREE, THYROIDAB in the last 72 hours.  Invalid input(s): FREET3   Anemia work up [employer] (last 2 labs)   No results for input(s): VITAMINB12, FOLATE, FERRITIN, TIBC, IRON, RETICCTPCT in the last 72 hours.   Urinalysis Labs (Brief)          Component Value Date/Time   COLORURINE YELLOW 10/01/2016 1459   APPEARANCEUR CLOUDY (A) 10/01/2016 1459   LABSPEC 1.014 10/01/2016 1459   PHURINE 5.0 10/01/2016 1459    GLUCOSEU NEGATIVE 10/01/2016 1459   HGBUR MODERATE (A) 10/01/2016 1459   BILIRUBINUR NEGATIVE 10/01/2016 1459   KETONESUR 5 (A) 10/01/2016 1459   PROTEINUR NEGATIVE 10/01/2016 1459   NITRITE NEGATIVE 10/01/2016 1459   LEUKOCYTESUR LARGE (A) 10/01/2016 1459       Microbiology        Recent Results (from the past 240 hour(s))  Culture, blood (Routine X 2) w Reflex to ID Panel     Status: None (Preliminary result)   Collection Time: 10/19/16 12:33 PM  Result Value Ref Range Status   Specimen Description BLOOD LEFT HAND  Final   Special Requests IN PEDIATRIC BOTTLE Blood Culture adequate volume  Final   Culture   Final  NO GROWTH < 24 HOURS Performed at [facility] [address], [place], [place] [address]    Report Status PENDING  Incomplete  Culture, blood (Routine X 2) w Reflex to ID Panel     Status: None (Preliminary result)   Collection Time: 10/19/16 12:34 PM  Result Value Ref Range Status   Specimen Description BLOOD RIGHT ARM  Final   Special Requests   Final    BOTTLES DRAWN AEROBIC AND ANAEROBIC Blood Culture adequate volume   Culture   Final    NO GROWTH < 24 HOURS Performed at [facility], [place] [address] [place],  [address]    Report Status PENDING  Incomplete  MRSA PCR Screening     Status: None   Collection Time: 10/19/16  1:56 PM  Result Value Ref Range Status   MRSA by PCR NEGATIVE NEGATIVE Final    Comment:        The GeneXpert MRSA Assay (FDA approved for NASAL specimens only), is one component of a comprehensive MRSA colonization surveillance program. It is not intended to diagnose MRSA infection nor to guide or monitor treatment for MRSA infections.        Inpatient Medications:   Scheduled Meds: . latanoprost  1 drop Both Eyes QHS  . terazosin  1 mg Oral QHS   Continuous Infusions: . cefTRIAXone (ROCEPHIN)  IV Stopped (10/20/16 0830)  . dextrose 5 % and 0.45 %  NaCl with KCl 20 mEq/L    . lactated ringers    . lactated ringers 75 mL/hr at 10/19/16 1100     Radiological Exams on Admission:  Imaging Results (Last 48 hours)  Dg C-arm 1-60 Min-no Report  Result Date: 10/19/2016 Fluoroscopy was utilized by the requesting physician.  No radiographic interpretation.    , but would be ok with pressors or temporary intubation if needed.  No CPR.

## 2016-10-21 NOTE — Progress Notes (Signed)
2 Days Post-Op  Subjective: Caroline Griffith is doing much better this morning and is without specific complaints.   The foley has been removed.   Her BP is much better and she is afebrile on the Eraxis for fungemia.   WBC and Cr have declined.  ROS:  Review of Systems  Constitutional: Negative for chills.  Gastrointestinal: Negative for nausea.  Musculoskeletal: Negative for back pain.    Anti-infectives: Anti-infectives    Start     Dose/Rate Route Frequency Ordered Stop   10/21/16 1800  anidulafungin (ERAXIS) 100 mg in sodium chloride 0.9 % 100 mL IVPB     100 mg 78 mL/hr over 100 Minutes Intravenous Every 24 hours 10/20/16 1719     10/20/16 1800  anidulafungin (ERAXIS) 200 mg in sodium chloride 0.9 % 200 mL IVPB     200 mg 78 mL/hr over 200 Minutes Intravenous Once 10/20/16 1719 10/20/16 2109   10/20/16 0700  cefTRIAXone (ROCEPHIN) 2 g in dextrose 5 % 50 mL IVPB     2 g 100 mL/hr over 30 Minutes Intravenous Daily before breakfast 10/20/16 0656     10/19/16 1600  cephALEXin (KEFLEX) capsule 250 mg  Status:  Discontinued     250 mg Oral 3 times daily 10/19/16 1357 10/20/16 0656   10/19/16 1300  cefTRIAXone (ROCEPHIN) 1 g in dextrose 5 % 50 mL IVPB  Status:  Discontinued     1 g 100 mL/hr over 30 Minutes Intravenous Every 24 hours 10/19/16 1200 10/19/16 1412   10/19/16 0615  ciprofloxacin (CIPRO) IVPB 400 mg     400 mg 200 mL/hr over 60 Minutes Intravenous  Once 10/19/16 0604 10/19/16 0845   10/19/16 0000  cephALEXin (KEFLEX) 500 MG capsule  Status:  Discontinued     500 mg Oral Daily at bedtime 10/19/16 1008 10/19/16    10/19/16 0000  cephALEXin (KEFLEX) 250 MG capsule    Comments:  Please take 1 at bedtime after the first 5 days.   250 mg Oral 3 times daily 10/19/16 1145 11/02/16 2359      Current Facility-Administered Medications  Medication Dose Route Frequency Provider Last Rate Last Dose  . acetaminophen (TYLENOL) tablet 650 mg  650 mg Oral Q4H PRN Irine Seal, MD   650 mg at  10/20/16 0837  . anidulafungin (ERAXIS) 100 mg in sodium chloride 0.9 % 100 mL IVPB  100 mg Intravenous Q24H Tommy Medal, Lavell Islam, MD      . bisacodyl (DULCOLAX) suppository 10 mg  10 mg Rectal Daily PRN Irine Seal, MD      . cefTRIAXone (ROCEPHIN) 2 g in dextrose 5 % 50 mL IVPB  2 g Intravenous QAC breakfast Irine Seal, MD   Stopped at 10/20/16 0830  . fentaNYL (SUBLIMAZE) injection 25 mcg  25 mcg Intravenous Q2H PRN Sampson Goon, MD   25 mcg at 10/20/16 2028  . hyoscyamine (LEVSIN SL) SL tablet 0.125 mg  0.125 mg Sublingual Q4H PRN Irine Seal, MD      . lactated ringers infusion   Intravenous Continuous Sampson Goon, MD 125 mL/hr at 10/21/16 0500    . latanoprost (XALATAN) 0.005 % ophthalmic solution 1 drop  1 drop Both Eyes QHS Irine Seal, MD   1 drop at 10/20/16 2028  . loperamide (IMODIUM) capsule 2 mg  2 mg Oral PRN Irine Seal, MD      . LORazepam (ATIVAN) tablet 0.5 mg  0.5 mg Oral BID PRN Irine Seal, MD      .  naproxen sodium (ANAPROX) tablet 275 mg  275 mg Oral BID PRN Irine Seal, MD      . ondansetron [facility]) injection 4 mg  4 mg Intravenous Q4H PRN Irine Seal, MD   4 mg at 10/20/16 1507  . senna-docusate (Senokot-S) tablet 1 tablet  1 tablet Oral QHS PRN Irine Seal, MD      . sodium phosphate (FLEET) 7-19 GM/118ML enema 1 enema  1 enema Rectal Once PRN Irine Seal, MD      . traMADol Veatrice Bourbon) tablet 50-100 mg  50-100 mg Oral Q6H PRN Irine Seal, MD   50 mg at 10/20/16 2347  . zolpidem (AMBIEN) tablet 5 mg  5 mg Oral QHS PRN Irine Seal, MD   5 mg at 10/20/16 2347     Objective: Vital signs in last 24 hours: Temp:  [98.2 F (36.8 C)-100.1 F (37.8 C)] 98.2 F (36.8 C) (10/11 0300) Pulse Rate:  [79-94] 80 (10/11 0600) Resp:  [14-28] 21 (10/11 0600) BP: (73-143)/(26-80) 137/52 (10/11 0600) SpO2:  [88 %-98 %] 97 % (10/11 0600)  Intake/Output from previous day: 10/10 0701 - 10/11 0700 In: 2234.2 [P.O.:120; I.V.:1804.2; IV Piggyback:310] Out: 1050  [Urine:1050] Intake/Output this shift: No intake/output data recorded.   Physical Exam  Constitutional: She is oriented to person, place, and time and well-developed, well-nourished, and in no distress.  Cardiovascular: Normal rate, regular rhythm and normal heart sounds.   Pulmonary/Chest: Effort normal and breath sounds normal. No respiratory distress.  Abdominal: Soft. There is no tenderness.  Neurological: She is alert and oriented to person, place, and time.  Skin: Skin is warm and dry.  Psychiatric: Mood and affect normal.  Vitals reviewed.   Lab Results:   Recent Labs  10/20/16 0317 10/21/16 0334  WBC 15.0* 12.2*  HGB 10.6* 10.3*  HCT 33.0* 31.9*  PLT 288 210   BMET  Recent Labs  10/20/16 0317 10/21/16 0334  NA 138 136  K 4.0 3.7  CL 105 105  CO2 25 24  GLUCOSE 121* 84  BUN 11 10  CREATININE 1.30* 1.06*  CALCIUM 7.5* 7.5*   PT/INR No results for input(s): LABPROT, INR in the last 72 hours. ABG No results for input(s): PHART, HCO3 in the last 72 hours.  Invalid input(s): PCO2, PO2  Studies/Results: Dg Chest Port 1 View  Result Date: 10/20/2016 CLINICAL DATA:  Hypoxia. EXAM: PORTABLE CHEST 1 VIEW COMPARISON:  06/19/2015. FINDINGS: Trachea is midline. Heart size stable. Thoracic aorta is calcified. There may be new right hilar prominence although the patient is rotated. Linear scarring in the left upper lobe. Lungs are otherwise clear. No pleural fluid. Large hiatal hernia. IMPRESSION: 1. Right hilar prominence may be due to rotation. Difficult to exclude adenopathy or mass. Consider follow-up PA and lateral views of the chest in further initial evaluation. 2. Otherwise, no acute findings in the chest. 3. Large hiatal hernia. Electronically Signed   By: Lorin Picket M.D.   On: 10/20/2016 14:20   Dg C-arm 1-60 Min-no Report  Result Date: 10/19/2016 Fluoroscopy was utilized by the requesting physician.  No radiographic interpretation.   Labs and X-ray  reports reviewed.   Note from Dr. Tommy Medal reviewed.   Assessment and Plan: S/P right ureteroscopy and stenting with postop fungal sepsis.   She is responding well to therapy.     I will discuss timing of stent removal with Dr. Tommy Medal.  It is scheduled for 11/2 but I will try to get it moved up  to next week.        LOS: 2 days    Malka So 10/21/2016 [phone] ID: Caroline Griffith, female   DOB: 17-Oct-1930, 83 y.o.   MRN: [id number]
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
## 2016-10-21 NOTE — Consult Note (Signed)
[facility] for Infectious Disease       Reason for Consult: fungemia    Referring Physician: Dr. Lynett Fish  Active Problems:   Right ureteral calculus   H/O cystoscopy   Leukocytosis   Hypotension   Anemia   Hypocalcemia   . cyanocobalamin  1,000 mcg Intramuscular q morning - 10a  . enoxaparin (LOVENOX) injection  40 mg Subcutaneous Q24H  . latanoprost  1 drop Both Eyes QHS  . nystatin  5 mL Oral QID    Recommendations: I will change to IV fluconazole TTE Repeat blood cultures done today already She will need an ophthalmology evaluation at some point, likely as an outpatient will be sufficient prior to stopping treatment Early stent removal will be ideal if possible I will stop ceftriaxone  Assessment: She has 2/2 blood cultures with C albicans on BCID and now confirmed on culture.  This is after recent cystoscopy with stent placement on 10/9.    Antibiotics: Ceftriaxone day 3 anidulafungin 1 day  HPI: Caroline Griffith is a 83 y.o. female with history of nephrolithiasis with ESL of right pelvic stone and stent placement 08/2014 who had increased pain last month and stone with right ureteral stent placed on 9/22 by Dr. [name].  Culture then grew E coli and she was treated and had above procedure for an 8 mm stone.  She then had a fever post stent placement to 101 and culture now have grown C albicans.  She was placed yesterday on empiric anidulafungin.  She is mainly complaining now of tongue discomfort and difficulty talking.  Her daughter is at the bedside.  She has had low BP since yesterday and general malaise.  WBC was 15, though down to 12 yesterday.  No sob, no diarrhea.    Review of Systems:  Constitutional: positive for malaise and anorexia or negative for chills Ears, nose, mouth, throat, and face: positive for sore mouth Respiratory: negative for cough or sputum Gastrointestinal: negative for diarrhea Genitourinary: negative for  hematuria All other systems reviewed and are negative    Past Medical History:  Diagnosis Date  . Arthritis   . Chronic kidney disease    kidney stones  . History of hiatal hernia   . Macular degeneration   . Pneumonia    hx. of  . Urolithiasis   . Varicose veins    bilateral LE      Social History  Substance Use Topics  . Smoking status: Former Smoker    Packs/day: 0.25    Years: 14.00    Types: Cigarettes    Quit date: 01/12/1963  . Smokeless tobacco: Never Used  . Alcohol use 4.2 oz/week    7 Cans of beer per week     Comment: one drink a night    Family History  Problem Relation Age of Onset  . Heart disease Mother   . Asthma Mother   . Emphysema Mother        smoked  . Heart disease Father     No Known Allergies  Physical Exam: Constitutional: in no apparent distress  Vitals:   10/21/16 0800 10/21/16 1150  BP:    Pulse:    Resp:    Temp: 98.9 F (37.2 C) 98.6 F (37 C)  SpO2:     EYES: anicteric ENMT: dry mm, cracked lips, no tongue lesions, no thrush Cardiovascular: Cor RRR Respiratory: CTA B; normal respiratory effort GI: Bowel sounds are normal, liver is not enlarged, spleen  is not enlarged Musculoskeletal: no pedal edema noted Skin: negatives: no rash  Lab Results  Component Value Date   WBC 12.2 (H) 10/21/2016   HGB 10.3 (L) 10/21/2016   HCT 31.9 (L) 10/21/2016   MCV 94.4 10/21/2016   PLT 210 10/21/2016    Lab Results  Component Value Date   CREATININE 1.06 (H) 10/21/2016   BUN 10 10/21/2016   NA 136 10/21/2016   K 3.7 10/21/2016   CL 105 10/21/2016   CO2 24 10/21/2016    Lab Results  Component Value Date   ALT 18 10/02/2016   AST 33 10/02/2016   ALKPHOS 91 10/02/2016     Microbiology: Recent Results (from the past 240 hour(s))  Culture, blood (Routine X 2) w Reflex to ID Panel     Status: Abnormal (Preliminary result)   Collection Time: 10/19/16 12:33 PM  Result Value Ref Range Status   Specimen Description BLOOD LEFT  HAND  Final   Special Requests IN PEDIATRIC BOTTLE Blood Culture adequate volume  Final   Culture  Setup Time   Final    YEAST IN PEDIATRIC BOTTLE CRITICAL VALUE NOTED.  VALUE IS CONSISTENT WITH PREVIOUSLY REPORTED AND CALLED VALUE. Performed at [facility], [place] [address] [place], [place] [address]    Culture YEAST (A)  Final   Report Status PENDING  Incomplete  Culture, blood (Routine X 2) w Reflex to ID Panel     Status: Abnormal (Preliminary result)   Collection Time: 10/19/16 12:34 PM  Result Value Ref Range Status   Specimen Description BLOOD RIGHT ARM  Final   Special Requests   Final    BOTTLES DRAWN AEROBIC AND ANAEROBIC Blood Culture adequate volume   Culture  Setup Time   Final    YEAST IN BOTH AEROBIC AND ANAEROBIC BOTTLES CRITICAL RESULT CALLED TO, READ BACK BY AND VERIFIED WITH: A [facility] PHARMD 1903 10/20/16 A BROWNING Performed at [facility], [place] [address] [place], [place] [address]    Culture CANDIDA ALBICANS (A)  Final   Report Status PENDING  Incomplete  Blood Culture ID Panel (Reflexed)     Status: Abnormal   Collection Time: 10/19/16 12:34 PM  Result Value Ref Range Status   Enterococcus species NOT DETECTED NOT DETECTED Final   Listeria monocytogenes NOT DETECTED NOT DETECTED Final   Staphylococcus species NOT DETECTED NOT DETECTED Final   Staphylococcus aureus NOT DETECTED NOT DETECTED Final   Streptococcus species NOT DETECTED NOT DETECTED Final   Streptococcus agalactiae NOT DETECTED NOT DETECTED Final   Streptococcus pneumoniae NOT DETECTED NOT DETECTED Final   Streptococcus pyogenes NOT DETECTED NOT DETECTED Final   Acinetobacter baumannii NOT DETECTED NOT DETECTED Final   Enterobacteriaceae species NOT DETECTED NOT DETECTED Final   Enterobacter cloacae complex NOT DETECTED NOT DETECTED Final   Escherichia coli NOT DETECTED NOT DETECTED Final   Klebsiella oxytoca NOT DETECTED NOT DETECTED Final   Klebsiella pneumoniae NOT DETECTED NOT  DETECTED Final   Proteus species NOT DETECTED NOT DETECTED Final   Serratia marcescens NOT DETECTED NOT DETECTED Final   Haemophilus influenzae NOT DETECTED NOT DETECTED Final   Neisseria meningitidis NOT DETECTED NOT DETECTED Final   Pseudomonas aeruginosa NOT DETECTED NOT DETECTED Final   Candida albicans DETECTED (A) NOT DETECTED Final    Comment: CRITICAL RESULT CALLED TO, READ BACK BY AND VERIFIED WITH: A [facility] PHARMD 1903 10/20/16 A BROWNING    Candida glabrata NOT DETECTED NOT DETECTED Final  Candida krusei NOT DETECTED NOT DETECTED Final   Candida parapsilosis NOT DETECTED NOT DETECTED Final   Candida tropicalis NOT DETECTED NOT DETECTED Final    Comment: Performed at [facility], [place] [address], [place], [place] [address]  MRSA PCR Screening     Status: None   Collection Time: 10/19/16  1:56 PM  Result Value Ref Range Status   MRSA by PCR NEGATIVE NEGATIVE Final    Comment:        The GeneXpert MRSA Assay (FDA approved for NASAL specimens only), is one component of a comprehensive MRSA colonization surveillance program. It is not intended to diagnose MRSA infection nor to guide or monitor treatment for MRSA infections.     Scharlene Gloss, [facility] for Infectious Disease [place] [email or web address] [phone] pager  [phone] cell 10/21/2016, 1:58 PM

## 2016-10-22 ENCOUNTER — Inpatient Hospital Stay (HOSPITAL_COMMUNITY): Payer: PPO

## 2016-10-22 DIAGNOSIS — D72829 Elevated white blood cell count, unspecified: Secondary | ICD-10-CM

## 2016-10-22 DIAGNOSIS — B377 Candidal sepsis: Secondary | ICD-10-CM | POA: Diagnosis present

## 2016-10-22 DIAGNOSIS — D519 Vitamin B12 deficiency anemia, unspecified: Secondary | ICD-10-CM

## 2016-10-22 DIAGNOSIS — D509 Iron deficiency anemia, unspecified: Secondary | ICD-10-CM

## 2016-10-22 DIAGNOSIS — I361 Nonrheumatic tricuspid (valve) insufficiency: Secondary | ICD-10-CM

## 2016-10-22 DIAGNOSIS — B49 Unspecified mycosis: Secondary | ICD-10-CM | POA: Diagnosis present

## 2016-10-22 DIAGNOSIS — E559 Vitamin D deficiency, unspecified: Secondary | ICD-10-CM

## 2016-10-22 LAB — ECHOCARDIOGRAM COMPLETE
Height: 66.5 in
WEIGHTICAEL: 2511.48 [oz_av]

## 2016-10-22 LAB — CBC
HCT: 34.1 % — ABNORMAL LOW (ref 36.0–46.0)
Hemoglobin: 11 g/dL — ABNORMAL LOW (ref 12.0–15.0)
MCH: 30.1 pg (ref 26.0–34.0)
MCHC: 32.3 g/dL (ref 30.0–36.0)
MCV: 93.2 fL (ref 78.0–100.0)
PLATELETS: 222 10*3/uL (ref 150–400)
RBC: 3.66 MIL/uL — AB (ref 3.87–5.11)
RDW: 13.7 % (ref 11.5–15.5)
WBC: 10 10*3/uL (ref 4.0–10.5)

## 2016-10-22 LAB — VITAMIN D 25 HYDROXY (VIT D DEFICIENCY, FRACTURES): VIT D 25 HYDROXY: 26.1 ng/mL — AB (ref 30.0–100.0)

## 2016-10-22 LAB — BASIC METABOLIC PANEL
Anion gap: 10 (ref 5–15)
BUN: 10 mg/dL (ref 6–20)
CO2: 24 mmol/L (ref 22–32)
CREATININE: 0.92 mg/dL (ref 0.44–1.00)
Calcium: 7.6 mg/dL — ABNORMAL LOW (ref 8.9–10.3)
Chloride: 103 mmol/L (ref 101–111)
GFR calc Af Amer: >60 mL/min (ref >60)
GFR, EST NON AFRICAN AMERICAN: 56 mL/min — AB (ref >60)
Glucose, Bld: 65 mg/dL (ref 65–99)
Potassium: 3.6 mmol/L (ref 3.5–5.1)
SODIUM: 137 mmol/L (ref 135–145)

## 2016-10-22 MED ORDER — CALCIUM CARBONATE 1250 (500 CA) MG PO TABS
2.0000 | ORAL_TABLET | Freq: Three times a day (TID) | ORAL | Status: DC
  Administered 2016-10-23 – 2016-10-27 (×7): 1000 mg via ORAL
  Filled 2016-10-22 (×11): qty 1

## 2016-10-22 MED ORDER — CYCLOPENTOLATE HCL 1 % OP SOLN
1.0000 [drp] | Freq: Once | OPHTHALMIC | Status: AC
Start: 2016-10-22 — End: 2016-10-22
  Administered 2016-10-22: 1 [drp] via OPHTHALMIC
  Filled 2016-10-22: qty 2

## 2016-10-22 MED ORDER — CHOLECALCIFEROL 10 MCG (400 UNIT) PO TABS
800.0000 [IU] | ORAL_TABLET | Freq: Every day | ORAL | Status: DC
  Administered 2016-10-22 – 2016-10-27 (×5): 800 [IU] via ORAL
  Filled 2016-10-22 (×6): qty 2

## 2016-10-22 MED ORDER — FUROSEMIDE 10 MG/ML IJ SOLN
20.0000 mg | Freq: Once | INTRAMUSCULAR | Status: AC
  Administered 2016-10-22: 20 mg via INTRAVENOUS
  Filled 2016-10-22: qty 2

## 2016-10-22 MED ORDER — CYANOCOBALAMIN 1000 MCG/ML IJ SOLN
1000.0000 ug | Freq: Every morning | INTRAMUSCULAR | Status: DC
  Administered 2016-10-23 – 2016-10-27 (×4): 1000 ug via INTRAMUSCULAR
  Filled 2016-10-22 (×5): qty 1

## 2016-10-22 NOTE — Progress Notes (Signed)
  Echocardiogram 2D Echocardiogram has been performed.  Mikie Misner L Androw 10/22/2016, 12:50 PM

## 2016-10-22 NOTE — Progress Notes (Signed)
CSW acknowledged consult for SNF placement. PT ordered, awaiting PT recommendation. CSW will continue to follow and assist with discharge planning.  Abundio Miu, [place] Social Worker [facility] Cell#: [phone]

## 2016-10-22 NOTE — Progress Notes (Signed)
CONSULT/PROGRESS NOTE    JON KASPAREK  XKG:[id number] DOB: 05/27/1930 DOA: 10/19/2016 PCP: Lavone Orn, MD   Brief Narrative:  Caroline Griffith an 83 y.o.femalewith a history of kidney stones, macular degeneration, varicose veins, OA, presenting after ureteroscopy per urology. She was admitted due to fever post op. TR[facility] consulted due to hypotension.   She was recently discharged on 9/24 for a right UPJ stone with associated hydronephrosis and a urinary tract infection with pansensitive Escherichia coli. She was discharged on Keflex for 7 days and then took prophylaxis doses nightly until she followed up with urology. On 10/9 she had ureteroscopy with laser and stent removal and replacement. She was admitted due to fever postop and hypotension.   Assessment & Plan:   Principal Problem:   Right ureteral calculus Active Problems:   Sepsis due to Candida (HCC)   Fungemia: Candida   H/O cystoscopy   Leukocytosis   Hypotension   Anemia   Hypocalcemia   Vitamin D deficiency  #1 right ureteral calculus status post cystoscopy/sepsis secondary to fungemia anemia/hypotension Patient noted to be febrile, hypotensive with a leukocytosis. Patient status post recent admission to [facility] with 8 mm R UP J calculus. Status post right ureteral stent placed 10/02/2016. Patient received a seven-day course of Keflex and then Keflex nightly prior to follow-up. 10/19/2016 patient had ureteroscopy with laser and stent removal and placement. Patient admitted due to fever postop and started on IV Rocephin. Hospitalists called to consult for hypotension 10/20/2016. Hypotension likely secondary to sepsis and dehydration. Blood pressure responded to IV fluids over 250 mL bolus. Patient also placed on IV fluids and follow. Rocephin dose increased to 2 g cultures are pending based on sensitivities from 10/01/2016. Cytology was also consistent with Candida. Blood cultures obtained were positive for Candida  albicans fungemia. ID consulted and the following and patient started on ERAXIS initially which has been changed to IV Diflucan. Repeat blood cultures ordered and are pending. ID following. Per urology.  #2 Candida albicans fungemia Patient with 2/2 blood cultures positive for Candida albicans the cystoscopy with stent placement 10/19/2016. Patient has been seen in consultation by ID, IV Rocephin discontinued, patient placed on IV fluconazole, 2-D echo ordered a repeat blood cultures ordered. ID patient will likely need to be seen by ophthalmologist prior to stopping treatment. Per ID.  #3 shortness of breath Patient with some complaints of shortness of breath. Patient with some scattered crackles no known examination. Check daily weights. Check a chest x-ray. We'll give a dose of Lasix 20 mg IV 1 and monitor blood pressure closely.  #4 vitamin D deficiency Patient with vitamin D 25-hydroxy level at 26.1. We'll place on oral vitamin D supplementation with calcium.  #5 vitamin B 12 deficiency Continue vitamin B-12 supplementation.  #6 iron deficiency anemia H&H stable. Patient would likely benefit from oral iron supplementation on discharge.  #7 hypocalcemia Corrected calcium 8.8 which is borderline. Vitamin D 25-hydroxy level at 26.1. Patient be started on vitamin D supplementation and calcium. Outpatient follow-up.     DVT prophylaxis: Lovenox Code Status: Partial Family Communication: Updated patient and daughter at bedside. Disposition Plan: Per primary team.   Consultants:   ID: Dr Linus Salmons: 10/21/2016  [facility]: Dr. Florene Glen 10/20/2016  PCCM: Dr. Belenda Cruise 10/20/2016  Procedures:   CXR 10/20/2016, 10/22/2016  ECHO 10/22/2016  Antimicrobials:   Oral cephalexin 10/19/2016>>>> 10/20/2016  IV Eraxis 10/20/2016>>>> 10/21/2016  IV Rocephin 10/19/2016 >>>> 10/20/2016  Irises ciprofloxacin 1 dose  IV Diflucan 10/21/2016  Subjective: Patient sitting up in bed  with some complaints of some shortness of breath. Patient stated she was very anxious overnight. Patient denies any chest pain.  Objective: Vitals:   10/21/16 2235 10/22/16 0548 10/22/16 1330 10/22/16 1500  BP: (!) 141/65 (!) 133/57 (!) 130/56   Pulse: 87 88 84   Resp: 20 16    Temp: 99.2 F (37.3 C) 98.7 F (37.1 C) 99.5 F (37.5 C)   TempSrc: Oral Oral Oral   SpO2: 96% 93% 100%   Weight:    72.8 kg (160 lb 7.9 oz)  Height:        Intake/Output Summary (Last 24 hours) at 10/22/16 1757 Last data filed at 10/22/16 1700  Gross per 24 hour  Intake          3199.33 ml  Output              850 ml  Net          2349.33 ml   Filed Weights   10/19/16 0703 10/19/16 1330 10/22/16 1500  Weight: 71.6 kg (157 lb 12.8 oz) 71.2 kg (156 lb 15.5 oz) 72.8 kg (160 lb 7.9 oz)    Examination:  General exam: Appears calm and comfortable  Respiratory system: Some scattered crackles otherwise clear. No wheezes. No rhonchi. Respiratory effort normal. Cardiovascular system: S1 & S2 heard, RRR. No JVD, murmurs, rubs, gallops or clicks. No pedal edema. Gastrointestinal system: Abdomen is nondistended, soft and nontender. No organomegaly or masses felt. Normal bowel sounds heard. Central nervous system: Alert and oriented. No focal neurological deficits. Extremities: Symmetric 5 x 5 power. Skin: No rashes, lesions or ulcers Psychiatry: Judgement and insight appear normal. Mood & affect appropriate.     Data Reviewed: I have personally reviewed following labs and imaging studies  CBC:  Recent Labs Lab 10/19/16 1225 10/20/16 0317 10/21/16 0334 10/22/16 0430  WBC 2.0* 15.0* 12.2* 10.0  NEUTROABS 1.8 13.4*  --   --   HGB 11.3* 10.6* 10.3* 11.0*  HCT 35.4* 33.0* 31.9* 34.1*  MCV 94.4 94.0 94.4 93.2  PLT 337 288 210 742   Basic Metabolic Panel:  Recent Labs Lab 10/19/16 1225 10/20/16 0317 10/21/16 0334 10/22/16 0430  NA 140 138 136 137  K 3.8 4.0 3.7 3.6  CL 104 105 105 103  CO2  26 25 24 24   GLUCOSE 80 121* 84 65  BUN 9 11 10 10   CREATININE 1.30* 1.30* 1.06* 0.92  CALCIUM 8.1* 7.5* 7.5* 7.6*   GFR: Estimated Creatinine Clearance: 47 mL/min (by C-G formula based on SCr of 0.92 mg/dL). Liver Function Tests:  Recent Labs Lab 10/20/16 1330  ALBUMIN 2.3*   No results for input(s): LIPASE, AMYLASE in the last 168 hours. No results for input(s): AMMONIA in the last 168 hours. Coagulation Profile: No results for input(s): INR, PROTIME in the last 168 hours. Cardiac Enzymes: No results for input(s): CKTOTAL, CKMB, CKMBINDEX, TROPONINI in the last 168 hours. BNP (last 3 results) No results for input(s): PROBNP in the last 8760 hours. HbA1C: No results for input(s): HGBA1C in the last 72 hours. CBG: No results for input(s): GLUCAP in the last 168 hours. Lipid Profile: No results for input(s): CHOL, HDL, LDLCALC, TRIG, CHOLHDL, LDLDIRECT in the last 72 hours. Thyroid Function Tests: No results for input(s): TSH, T4TOTAL, FREET4, T3FREE, THYROIDAB in the last 72 hours. Anemia Panel:  Recent Labs  10/20/16 1407  VITAMINB12 130*  FOLATE 8.4  FERRITIN 147  TIBC 136*  IRON 5*   Sepsis Labs:  Recent Labs Lab 10/19/16 1225 10/20/16 1123 10/20/16 1407  LATICACIDVEN 1.6 1.6 1.6    Recent Results (from the past 240 hour(s))  Culture, blood (Routine X 2) w Reflex to ID Panel     Status: None (Preliminary result)   Collection Time: 10/19/16 12:33 PM  Result Value Ref Range Status   Specimen Description BLOOD LEFT HAND  Final   Special Requests IN PEDIATRIC BOTTLE Blood Culture adequate volume  Final   Culture  Setup Time   Final    YEAST IN PEDIATRIC BOTTLE CRITICAL VALUE NOTED.  VALUE IS CONSISTENT WITH PREVIOUSLY REPORTED AND CALLED VALUE. Performed at [facility], [place] [address] [place], [place] [address]    Culture YEAST  Final   Report Status PENDING  Incomplete  Culture, blood (Routine X 2) w Reflex to ID Panel     Status: Abnormal  (Preliminary result)   Collection Time: 10/19/16 12:34 PM  Result Value Ref Range Status   Specimen Description BLOOD RIGHT ARM  Final   Special Requests   Final    BOTTLES DRAWN AEROBIC AND ANAEROBIC Blood Culture adequate volume   Culture  Setup Time   Final    YEAST IN BOTH AEROBIC AND ANAEROBIC BOTTLES CRITICAL RESULT CALLED TO, READ BACK BY AND VERIFIED WITH: A [facility] PHARMD 1903 10/20/16 A BROWNING Performed at [facility], [place] [address], [place], [place] [address]    Culture CANDIDA ALBICANS (A)  Final   Report Status PENDING  Incomplete  Blood Culture ID Panel (Reflexed)     Status: Abnormal   Collection Time: 10/19/16 12:34 PM  Result Value Ref Range Status   Enterococcus species NOT DETECTED NOT DETECTED Final   Listeria monocytogenes NOT DETECTED NOT DETECTED Final   Staphylococcus species NOT DETECTED NOT DETECTED Final   Staphylococcus aureus NOT DETECTED NOT DETECTED Final   Streptococcus species NOT DETECTED NOT DETECTED Final   Streptococcus agalactiae NOT DETECTED NOT DETECTED Final   Streptococcus pneumoniae NOT DETECTED NOT DETECTED Final   Streptococcus pyogenes NOT DETECTED NOT DETECTED Final   Acinetobacter baumannii NOT DETECTED NOT DETECTED Final   Enterobacteriaceae species NOT DETECTED NOT DETECTED Final   Enterobacter cloacae complex NOT DETECTED NOT DETECTED Final   Escherichia coli NOT DETECTED NOT DETECTED Final   Klebsiella oxytoca NOT DETECTED NOT DETECTED Final   Klebsiella pneumoniae NOT DETECTED NOT DETECTED Final   Proteus species NOT DETECTED NOT DETECTED Final   Serratia marcescens NOT DETECTED NOT DETECTED Final   Haemophilus influenzae NOT DETECTED NOT DETECTED Final   Neisseria meningitidis NOT DETECTED NOT DETECTED Final   Pseudomonas aeruginosa NOT DETECTED NOT DETECTED Final   Candida albicans DETECTED (A) NOT DETECTED Final    Comment: CRITICAL RESULT CALLED TO, READ BACK BY AND VERIFIED WITH: A [facility] PHARMD 1903 10/20/16 A  BROWNING    Candida glabrata NOT DETECTED NOT DETECTED Final   Candida krusei NOT DETECTED NOT DETECTED Final   Candida parapsilosis NOT DETECTED NOT DETECTED Final   Candida tropicalis NOT DETECTED NOT DETECTED Final    Comment: Performed at [facility], [address] [place],  [address]  MRSA PCR Screening     Status: None   Collection Time: 10/19/16  1:56 PM  Result Value Ref Range Status   MRSA by PCR NEGATIVE NEGATIVE Final    Comment:        The GeneXpert MRSA Assay (FDA approved for NASAL  specimens only), is one component of a comprehensive MRSA colonization surveillance program. It is not intended to diagnose MRSA infection nor to guide or monitor treatment for MRSA infections.   Culture, Urine     Status: Abnormal   Collection Time: 10/20/16  2:05 PM  Result Value Ref Range Status   Specimen Description URINE, CLEAN CATCH  Final   Special Requests NONE  Final   Culture MULTIPLE SPECIES PRESENT, SUGGEST RECOLLECTION (A)  Final   Report Status 10/21/2016 FINAL  Final  Culture, blood (routine x 2)     Status: None (Preliminary result)   Collection Time: 10/21/16 12:29 PM  Result Value Ref Range Status   Specimen Description BLOOD RIGHT ARM  Final   Special Requests   Final    BOTTLES DRAWN AEROBIC AND ANAEROBIC Blood Culture adequate volume   Culture   Final    NO GROWTH < 24 HOURS Performed at [facility], [place] [address] [place], [place] [address]    Report Status PENDING  Incomplete  Culture, blood (routine x 2)     Status: None (Preliminary result)   Collection Time: 10/21/16 12:29 PM  Result Value Ref Range Status   Specimen Description BLOOD RIGHT HAND  Final   Special Requests IN PEDIATRIC BOTTLE Blood Culture adequate volume  Final   Culture   Final    NO GROWTH < 24 HOURS Performed at [facility], [place] [address], [place], [place] [address]    Report Status PENDING  Incomplete         Radiology Studies: Dg Chest 2  View  Result Date: 10/22/2016 CLINICAL DATA:  Shortness of breath.  Fatigue. EXAM: CHEST  2 VIEW COMPARISON:  10/20/2016 . FINDINGS: Cardiomegaly with pulmonary vascular prominence and bilateral interstitial prominence bilateral pleural effusions. Findings consistent CHF. Prominence sliding hiatal hernia. No pneumothorax. IMPRESSION: 1. Congestive heart failure with bilateral pulmonary interstitial edema and bilateral pleural effusions. 2. Sliding hiatal hernia. Electronically Signed   By: Marcello Moores  Register   On: 10/22/2016 15:58        Scheduled Meds: . calcium carbonate  2 tablet Oral TID WC  . cholecalciferol  800 Units Oral Daily  . cyanocobalamin  1,000 mcg Intramuscular q morning - 10a  . enoxaparin (LOVENOX) injection  40 mg Subcutaneous Q24H  . latanoprost  1 drop Both Eyes QHS  . nystatin  5 mL Oral QID   Continuous Infusions: . sodium chloride 10 mL/hr at 10/22/16 1349  . fluconazole (DIFLUCAN) IV Stopped (10/22/16 1554)  . lactated ringers 125 mL/hr at 10/21/16 0500     LOS: 3 days    Time spent: 16 minutes    THOMPSON,DANIEL, MD [facility] Pager [phone]  If 7PM-7AM, please contact night-coverage [email or web address] Password [email or web address] 10/22/2016, 5:57 PM

## 2016-10-22 NOTE — Evaluation (Addendum)
Physical Therapy Evaluation Patient Details Name: Caroline Griffith MRN: [id number] DOB: Aug 27, 1930 Today's Date: 10/22/2016   History of Present Illness  83 yo female s/p cystoscopy, R double J stent placement 10/19/16, weakness. Prior stent placement 10/02/16. Hx of CKD, arthritis macular degeneration    Clinical Impression  On eval, pt required Mod assist for mobility. She walked ~45 feet with a RW. Pt denied pain. She presents with general weakness, decreased activity tolerance, and impaired gait and balance. Pt is at risk for falls. Recommendation is for ST rehab at [facility]. Will follow and progress activity as tolerated.     Follow Up Recommendations SNF    Equipment Recommendations  None recommended by PT    Recommendations for Other Services       Precautions / Restrictions Precautions Precautions: Fall Restrictions Weight Bearing Restrictions: No      Mobility  Bed Mobility Overal bed mobility: Needs Assistance Bed Mobility: Supine to Sit;Sit to Supine     Supine to sit: Mod assist;HOB elevated Sit to supine: Min assist;HOB elevated   General bed mobility comments: Increased time. Assist for trunk mostly. Small amount of assist to get R LE onto bed. Pt has sensitive LEs.   Transfers Overall transfer level: Needs assistance Equipment used: Rolling walker (2 wheeled) Transfers: Sit to/from Stand Sit to Stand: From elevated surface;Mod assist         General transfer comment: Assist to rise, stabilize, control descent. VCs safety, technique, hand placement. Increased time. Pt had to rock a few times before rising.   Ambulation/Gait Ambulation/Gait assistance: Min assist Ambulation Distance (Feet): 45 Feet Assistive device: Rolling walker (2 wheeled) Gait Pattern/deviations: Step-through pattern;Decreased stride length     General Gait Details: Unsteady. Assist to stabilize pt and maneuver with RW. Pt fatigues fairly easily   Stairs            Wheelchair  Mobility    Modified Rankin (Stroke Patients Only)       Balance Overall balance assessment: Needs assistance         Standing balance support: Bilateral upper extremity supported Standing balance-Leahy Scale: Poor                               Pertinent Vitals/Pain Pain Assessment: No/denies pain    Home Living Family/patient expects to be discharged to:: Skilled nursing facility Living Arrangements: Alone Available Help at Discharge: Family;Available PRN/intermittently Type of Home: House Home Access: Stairs to enter Entrance Stairs-Rails: Psychiatric nurse of Steps: 4 Home Layout: One level Home Equipment: Walker - 2 wheels;Cane - single point      Prior Function Level of Independence: Independent with assistive device(s)         Comments: using cane up until ~2 weeks ago.      Hand Dominance        Extremity/Trunk Assessment   Upper Extremity Assessment Upper Extremity Assessment: Generalized weakness    Lower Extremity Assessment Lower Extremity Assessment: Generalized weakness    Cervical / Trunk Assessment Cervical / Trunk Assessment: Normal  Communication   Communication: No difficulties  Cognition Arousal/Alertness: Awake/alert Behavior During Therapy: WFL for tasks assessed/performed Overall Cognitive Status: Within Functional Limits for tasks assessed                                        General  Comments      Exercises  Ankle pumps/Heel raises, 15 reps, seated, active Long Arc Quads, 15 reps, active, seated Marching, 15 reps, active, seated   Assessment/Plan    PT Assessment Patient needs continued PT services  PT Problem List Decreased strength;Decreased mobility;Decreased activity tolerance;Decreased balance;Decreased knowledge of use of DME       PT Treatment Interventions DME instruction;Gait training;Therapeutic activities;Therapeutic exercise;Patient/family education;Balance  training;Functional mobility training    PT Goals (Current goals can be found in the Care Plan section)  Acute Rehab PT Goals Patient Stated Goal: rehab PT Goal Formulation: With patient/family Time For Goal Achievement: 11/05/16 Potential to Achieve Goals: Good    Frequency Min 3X/week   Barriers to discharge        Co-evaluation               AM-PAC PT "6 Clicks" Daily Activity  Outcome Measure Difficulty turning over in bed (including adjusting bedclothes, sheets and blankets)?: Unable Difficulty moving from lying on back to sitting on the side of the bed? : Unable Difficulty sitting down on and standing up from a chair with arms (e.g., wheelchair, bedside commode, etc,.)?: Unable Help needed moving to and from a bed to chair (including a wheelchair)?: A Lot Help needed walking in hospital room?: A Lot Help needed climbing 3-5 steps with a railing? : A Lot 6 Click Score: 9    End of Session Equipment Utilized During Treatment: Gait belt Activity Tolerance: Patient limited by fatigue Patient left: in bed;with call bell/phone within reach;with bed alarm set;with family/visitor present   PT Visit Diagnosis: Muscle weakness (generalized) (M62.81);Difficulty in walking, not elsewhere classified (R26.2)    Time: 1432-1500 PT Time Calculation (min) (ACUTE ONLY): 28 min   Charges:   PT Evaluation $PT Eval Low Complexity: 1 Low PT Treatments $Gait Training: 8-22 mins   PT G Codes:          Weston Anna, MPT Pager: [phone] '

## 2016-10-22 NOTE — Progress Notes (Addendum)
Urology Inpatient Progress Report  RIGHT URETERAL STONE  Procedure(s): CYSTOSCOPY/URETEROSCOPY/HOLMIUM LASER/STENT EXCHANGE  3 Days Post-Op   Intv/Subj: No acute events overnight. Feels she had a panic attack last night, fels better this am.  States she has no appetite and has not been eating much.  Active Problems:   Right ureteral calculus   H/O cystoscopy   Leukocytosis   Hypotension   Anemia   Hypocalcemia  Current Facility-Administered Medications  Medication Dose Route Frequency Provider Last Rate Last Dose  . 0.9 %  sodium chloride infusion   Intravenous Continuous Reyne Dumas, MD 75 mL/hr at 10/22/16 0201    . acetaminophen (TYLENOL) tablet 650 mg  650 mg Oral Q4H PRN Irine Seal, MD   650 mg at 10/20/16 0837  . bisacodyl (DULCOLAX) suppository 10 mg  10 mg Rectal Daily PRN Irine Seal, MD      . cyanocobalamin ((VITAMIN B-12)) injection 1,000 mcg  1,000 mcg Intramuscular q morning - 10a Reyne Dumas, MD   1,000 mcg at 10/21/16 1457  . enoxaparin (LOVENOX) injection 40 mg  40 mg Subcutaneous Q24H Polly Cobia, RPH   40 mg at 10/21/16 1456  . fentaNYL (SUBLIMAZE) injection 25 mcg  25 mcg Intravenous Q2H PRN Sampson Goon, MD   25 mcg at 10/21/16 1748  . fluconazole (DIFLUCAN) IVPB 200 mg  200 mg Intravenous Q24H Irine Seal, MD      . hyoscyamine (LEVSIN SL) SL tablet 0.125 mg  0.125 mg Sublingual Q4H PRN Irine Seal, MD      . lactated ringers infusion   Intravenous Continuous Sampson Goon, MD 125 mL/hr at 10/21/16 0500    . latanoprost (XALATAN) 0.005 % ophthalmic solution 1 drop  1 drop Both Eyes QHS Irine Seal, MD   1 drop at 10/21/16 2253  . loperamide (IMODIUM) capsule 2 mg  2 mg Oral PRN Irine Seal, MD      . LORazepam (ATIVAN) tablet 0.5 mg  0.5 mg Oral BID PRN Irine Seal, MD   0.5 mg at 10/22/16 0654  . nystatin (MYCOSTATIN) 100000 UNIT/ML suspension 500,000 Units  5 mL Oral QID Reyne Dumas, MD   500,000 Units at 10/21/16 2234  . ondansetron (ZOFRAN)  injection 4 mg  4 mg Intravenous Q4H PRN Irine Seal, MD   4 mg at 10/21/16 1903  . senna-docusate (Senokot-S) tablet 1 tablet  1 tablet Oral QHS PRN Irine Seal, MD      . sodium phosphate (FLEET) 7-19 GM/118ML enema 1 enema  1 enema Rectal Once PRN Irine Seal, MD      . traMADol Veatrice Bourbon) tablet 50-100 mg  50-100 mg Oral Q6H PRN Irine Seal, MD   50 mg at 10/20/16 2347  . zolpidem (AMBIEN) tablet 5 mg  5 mg Oral QHS PRN Irine Seal, MD   5 mg at 10/20/16 2347     Objective: Vital: Vitals:   10/21/16 1150 10/21/16 1200 10/21/16 2235 10/22/16 0548  BP:  (!) 154/59 (!) 141/65 (!) 133/57  Pulse:  86 87 88  Resp:  (!) 25 20 16   Temp: 98.6 F (37 C)  99.2 F (37.3 C) 98.7 F (37.1 C)  TempSrc: Oral  Oral Oral  SpO2:  99% 96% 93%  Weight:      Height:       I/Os: I/O last 3 completed shifts: In: 4106.3 [P.O.:120; I.V.:3986.3] Out: 1900 [Urine:1900]  Physical Exam:  General: Patient is in no apparent distress Lungs: Normal respiratory effort, chest expands  symmetrically. GI: The abdomen is soft and nontender without mass. GU: urine clear yellow Ext: lower extremities symmetric  Lab Results:  Recent Labs  10/20/16 0317 10/21/16 0334 10/22/16 0430  WBC 15.0* 12.2* 10.0  HGB 10.6* 10.3* 11.0*  HCT 33.0* 31.9* 34.1*    Recent Labs  10/20/16 0317 10/21/16 0334 10/22/16 0430  NA 138 136 137  K 4.0 3.7 3.6  CL 105 105 103  CO2 25 24 24   GLUCOSE 121* 84 65  BUN 11 10 10   CREATININE 1.30* 1.06* 0.92  CALCIUM 7.5* 7.5* 7.6*   No results for input(s): LABPT, INR in the last 72 hours. No results for input(s): LABURIN in the last 72 hours. Results for orders placed or performed during the hospital encounter of 10/19/16  Culture, blood (Routine X 2) w Reflex to ID Panel     Status: Abnormal (Preliminary result)   Collection Time: 10/19/16 12:33 PM  Result Value Ref Range Status   Specimen Description BLOOD LEFT HAND  Final   Special Requests IN PEDIATRIC BOTTLE Blood  Culture adequate volume  Final   Culture  Setup Time   Final    YEAST IN PEDIATRIC BOTTLE CRITICAL VALUE NOTED.  VALUE IS CONSISTENT WITH PREVIOUSLY REPORTED AND CALLED VALUE. Performed at [facility], [place] [address] [place], [place] [address]    Culture YEAST (A)  Final   Report Status PENDING  Incomplete  Culture, blood (Routine X 2) w Reflex to ID Panel     Status: Abnormal (Preliminary result)   Collection Time: 10/19/16 12:34 PM  Result Value Ref Range Status   Specimen Description BLOOD RIGHT ARM  Final   Special Requests   Final    BOTTLES DRAWN AEROBIC AND ANAEROBIC Blood Culture adequate volume   Culture  Setup Time   Final    YEAST IN BOTH AEROBIC AND ANAEROBIC BOTTLES CRITICAL RESULT CALLED TO, READ BACK BY AND VERIFIED WITH: A [facility] PHARMD 1903 10/20/16 A BROWNING Performed at [facility], [place] [address], [place], [place] [address]    Culture CANDIDA ALBICANS (A)  Final   Report Status PENDING  Incomplete  Blood Culture ID Panel (Reflexed)     Status: Abnormal   Collection Time: 10/19/16 12:34 PM  Result Value Ref Range Status   Enterococcus species NOT DETECTED NOT DETECTED Final   Listeria monocytogenes NOT DETECTED NOT DETECTED Final   Staphylococcus species NOT DETECTED NOT DETECTED Final   Staphylococcus aureus NOT DETECTED NOT DETECTED Final   Streptococcus species NOT DETECTED NOT DETECTED Final   Streptococcus agalactiae NOT DETECTED NOT DETECTED Final   Streptococcus pneumoniae NOT DETECTED NOT DETECTED Final   Streptococcus pyogenes NOT DETECTED NOT DETECTED Final   Acinetobacter baumannii NOT DETECTED NOT DETECTED Final   Enterobacteriaceae species NOT DETECTED NOT DETECTED Final   Enterobacter cloacae complex NOT DETECTED NOT DETECTED Final   Escherichia coli NOT DETECTED NOT DETECTED Final   Klebsiella oxytoca NOT DETECTED NOT DETECTED Final   Klebsiella pneumoniae NOT DETECTED NOT DETECTED Final   Proteus species NOT DETECTED NOT  DETECTED Final   Serratia marcescens NOT DETECTED NOT DETECTED Final   Haemophilus influenzae NOT DETECTED NOT DETECTED Final   Neisseria meningitidis NOT DETECTED NOT DETECTED Final   Pseudomonas aeruginosa NOT DETECTED NOT DETECTED Final   Candida albicans DETECTED (A) NOT DETECTED Final    Comment: CRITICAL RESULT CALLED TO, READ BACK BY AND VERIFIED WITH: A [facility] PHARMD 1903 10/20/16 A BROWNING  Candida glabrata NOT DETECTED NOT DETECTED Final   Candida krusei NOT DETECTED NOT DETECTED Final   Candida parapsilosis NOT DETECTED NOT DETECTED Final   Candida tropicalis NOT DETECTED NOT DETECTED Final    Comment: Performed at [facility], [place] [address] [place], [place] [address]  MRSA PCR Screening     Status: None   Collection Time: 10/19/16  1:56 PM  Result Value Ref Range Status   MRSA by PCR NEGATIVE NEGATIVE Final    Comment:        The GeneXpert MRSA Assay (FDA approved for NASAL specimens only), is one component of a comprehensive MRSA colonization surveillance program. It is not intended to diagnose MRSA infection nor to guide or monitor treatment for MRSA infections.   Culture, Urine     Status: Abnormal   Collection Time: 10/20/16  2:05 PM  Result Value Ref Range Status   Specimen Description URINE, CLEAN CATCH  Final   Special Requests NONE  Final   Culture MULTIPLE SPECIES PRESENT, SUGGEST RECOLLECTION (A)  Final   Report Status 10/21/2016 FINAL  Final    Studies/Results: Dg Chest Port 1 View  Result Date: 10/20/2016 CLINICAL DATA:  Hypoxia. EXAM: PORTABLE CHEST 1 VIEW COMPARISON:  06/19/2015. FINDINGS: Trachea is midline. Heart size stable. Thoracic aorta is calcified. There may be new right hilar prominence although the patient is rotated. Linear scarring in the left upper lobe. Lungs are otherwise clear. No pleural fluid. Large hiatal hernia. IMPRESSION: 1. Right hilar prominence may be due to rotation. Difficult to exclude adenopathy or mass.  Consider follow-up PA and lateral views of the chest in further initial evaluation. 2. Otherwise, no acute findings in the chest. 3. Large hiatal hernia. Electronically Signed   By: Lorin Picket M.D.   On: 10/20/2016 14:20    Assessment: Procedure(s): ureteral stone s/p: CYSTOSCOPY/URETEROSCOPY/HOLMIUM LASER/STENT EXCHANGE, 3 Days Post-Op    2. Sepsis/fungemia 2/2 to the above. Afebrile with resolved leukocytosis. Cr WNL.  Plan: Has been switched to IV flucon.  Appreciate ID assistance Ophthalmology consult to rule out ocular involvement F/U repeat Bcx drawn 10/11 Will add ensure to trays given poor PO intake Working with case manager for rehab placement Plan for stent removal next week by Dr [name] either here or in office   Link Snuffer, MD Urology 10/22/2016, 8:16 AM

## 2016-10-22 NOTE — Progress Notes (Addendum)
    [facility]   Reason for visit: Follow up on fungemia  Interval History: Echo done, repeat cultures ngtd.  Patient going to rehab.  Stent removal planned for next week.   Physical Exam: Constitutional:  Vitals:   10/22/16 0548 10/22/16 1330  BP: (!) 133/57 (!) 130/56  Pulse: 88 84  Resp: 16   Temp: 98.7 F (37.1 C) 99.5 F (37.5 C)  SpO2: 93% 100%   patient appears in NAD  Impression: Stable fungemia.  Plan: 1.  Continue fluconazole.   I will plan for 3 weeks of fluconazole with stent removal next week through October 31st She can take oral 400mg  fluconazole at discharge once daily, no need for picc line She should get an ophthalmology exam prior to October 31st to assure no endophthalmitis from fungemia  Dr. Tommy Medal available over the weekend if needed, otherwise I will follow up Monday or in clinic if she is discharged

## 2016-10-22 NOTE — Consult Note (Addendum)
Caroline Griffith                                                                               10/22/2016                                              Ophthalmology Consultation                                         Consult requested by: Dr. Scharlene Gloss  Reason for consultation:  Rule out endophthalmitis  CWC:BJSEG Caroline Griffith is a 83 y.o. female with history of nephrolithiasisand stent placement 08/2014 who had increased pain last month and stone with right ureteral stent placed on 9/22 by Dr. [name].  Culture then grew E coli and she was treated and had above procedure for an 8 mm stone.  She then had a fever post stent placement to 101 and culture now have grown C albicans.  She was placed yesterday on empiric anidulafungin.  Patient denies vision changes.  Pertinent Medical History:   Active Ambulatory Problems    Diagnosis Date Noted  . Dyspnea 03/19/2014  . Pulmonary nodules 03/20/2014  . Stone, kidney 08/15/2014  . Right nephrolithiasis 08/15/2014  . Nephrolithiasis 10/01/2016  . Renal insufficiency 10/01/2016  . Lower urinary tract infectious disease    Resolved Ambulatory Problems    Diagnosis Date Noted  . No Resolved Ambulatory Problems   Past Medical History:  Diagnosis Date  . Arthritis   . Chronic kidney disease   . History of hiatal hernia   . Macular degeneration   . Pneumonia   . Urolithiasis   . Varicose veins       Pertinent Ophthalmic History: Cataract surgery  Current Eye Medications: none  Systemic medications on admission:   Medications Prior to Admission  Medication Sig Dispense Refill  . acetaminophen (TYLENOL) 325 MG tablet Take 325 mg by mouth every 6 (six) hours as needed for moderate pain or headache.    . latanoprost (XALATAN) 0.005 % ophthalmic solution PLACE 1 DROP INTO BOTH EYES ONCE DAILY  12  . loperamide (IMODIUM) 2 MG capsule Take 2 mg by mouth as needed for diarrhea or loose stools.    Marland Kitchen LORazepam (ATIVAN) 0.5 MG tablet Take 0.5 mg by  mouth 2 (two) times daily as needed for anxiety.    . naproxen sodium (ANAPROX) 220 MG tablet Take 220 mg by mouth as needed.    Marland Kitchen PREVIDENT 5000 BOOSTER PLUS 1.1 % PSTE Use once daily as directed  1  . traMADol (ULTRAM) 50 MG tablet Take 50-100 mg by mouth every 6 (six) hours as needed for moderate pain or severe pain.     Marland Kitchen terazosin (HYTRIN) 1 MG capsule Take 1 capsule (1 mg total) by mouth at bedtime. 30 capsule 0  . [DISCONTINUED] senna-docusate (SENOKOT-S) 8.6-50 MG tablet Take 1 tablet by mouth at bedtime. (Patient not taking: Reported on 10/12/2016) 14 tablet 0  ROS: noncontributory  Visual Fields: FTC OU      Pupils:  Pharmacologically dilated at my direction before exam   Equal, brisk, no APD     Near acuity:      20/40   OD        20/40  OS     TA:         Normal to palpation OU     Dilation:  both eyes        Medication used Cyclogyl  External:   OD:  Normal       OS:  Normal      Anterior segment exam:  By penlight   By slit lap      Conjunctiva:  OD:  Quiet      OS:  Quiet     Cornea:    OD: Clear, no fluorescein stain       OS: Clear, no fluorescein stain      Anterior Chamber:   OD:  Deep/quiet      OS:  Deep/quiet     Iris:    OD:  Normal       OS:  Normal      Lens:    OD:  Clear         OS:  Clear          Optic disc:  OD:  Flat, sharp, pink, healthy      OS:  Flat, sharp, pink, healthy      Central retina--examined with indirect ophthalmoscope:  OD:  Macula and vessels normal; media clear      OS:  Macula and vessels normal; media clear      Peripheral retina--examined with indirect ophthalmoscope with lid speculum and scleral depression:   OD:  Normal to ora 360 degrees      OS:  Normal to ora 360 degrees      Impression:   Candidemia without evidence of endophthalmitis OU  Recommendations/Plan: Continue current abx treatment  I've discussed these findings with the nurse and/or resident. Please contact our office with any  questions or concerns at [phone]. Thank you for calling us to care for this patient .  Royston Cowper

## 2016-10-23 LAB — CULTURE, BLOOD (ROUTINE X 2)
Special Requests: ADEQUATE
Special Requests: ADEQUATE

## 2016-10-23 LAB — CBC
HEMATOCRIT: 31.8 % — AB (ref 36.0–46.0)
HEMOGLOBIN: 10.4 g/dL — AB (ref 12.0–15.0)
MCH: 30 pg (ref 26.0–34.0)
MCHC: 32.7 g/dL (ref 30.0–36.0)
MCV: 91.6 fL (ref 78.0–100.0)
Platelets: 247 10*3/uL (ref 150–400)
RBC: 3.47 MIL/uL — ABNORMAL LOW (ref 3.87–5.11)
RDW: 13.6 % (ref 11.5–15.5)
WBC: 7.3 10*3/uL (ref 4.0–10.5)

## 2016-10-23 LAB — BASIC METABOLIC PANEL
ANION GAP: 8 (ref 5–15)
BUN: 10 mg/dL (ref 6–20)
CALCIUM: 7.8 mg/dL — AB (ref 8.9–10.3)
CO2: 28 mmol/L (ref 22–32)
CREATININE: 0.81 mg/dL (ref 0.44–1.00)
Chloride: 103 mmol/L (ref 101–111)
GFR calc non Af Amer: >60 mL/min (ref >60)
GLUCOSE: 84 mg/dL (ref 65–99)
Potassium: 3 mmol/L — ABNORMAL LOW (ref 3.5–5.1)
Sodium: 139 mmol/L (ref 135–145)

## 2016-10-23 MED ORDER — FLUCONAZOLE 100 MG PO TABS
400.0000 mg | ORAL_TABLET | Freq: Every day | ORAL | Status: DC
  Administered 2016-10-23: 400 mg via ORAL
  Filled 2016-10-23: qty 4

## 2016-10-23 MED ORDER — POTASSIUM CHLORIDE CRYS ER 20 MEQ PO TBCR
40.0000 meq | EXTENDED_RELEASE_TABLET | Freq: Four times a day (QID) | ORAL | Status: AC
Start: 2016-10-23 — End: 2016-10-23
  Administered 2016-10-23 (×2): 40 meq via ORAL
  Filled 2016-10-23 (×2): qty 2

## 2016-10-23 NOTE — Progress Notes (Signed)
CONSULT/PROGRESS NOTE    Caroline Griffith  HKV:[id number] DOB: March 16, 1930 DOA: 10/19/2016 PCP: Lavone Orn, MD   Brief Narrative:  Caroline Griffith an 83 y.o.femalewith a history of kidney stones, macular degeneration, varicose veins, OA, presenting after ureteroscopy per urology. She was admitted due to fever post op. TRH consulted due to hypotension.   She was recently discharged on 9/24 for a right UPJ stone with associated hydronephrosis and a urinary tract infection with pansensitive Escherichia coli. She was discharged on Keflex for 7 days and then took prophylaxis doses nightly until she followed up with urology. On 10/9 she had ureteroscopy with laser and stent removal and replacement. She was admitted due to fever postop and hypotension.   Assessment & Plan:    #1 right ureteral calculus status post cystoscopy/sepsis secondary to fungemia anemia/hypotension Patient noted to be febrile, hypotensive with a leukocytosis. Patient status post recent admission to [facility] with 8 mm R UP J calculus. Status post right ureteral stent placed 10/02/2016, with CYSTOSCOPY/URETEROSCOPY/HOLMIUM LASER/STENT EXCHANGE 10/19/2016, treated with appropriate antibiotics and now antibiotics have been stopped. Seen by ID. Has candidemia for which she had ophthalmology evaluation as well. Currently on fluconazole which per ID she has to continue 400 mg oral daily with a stop date of October 31. Seen by urology here as well.     #2 Candida albicans fungemia Patient with 2/2 blood cultures positive for Candida albicans the cystoscopy with stent placement 10/19/2016. Patient has been seen in consultation by ID, IV Rocephin discontinued, patient placed on  fluconazole, 2-D echo done showing no evidence of vegetation, repeat blood cultures so far negative, ophthalmology also saw the patient with a negative eye exam. Per ID can be switched to oral fluconazole at the time of discharge with a finish date of  11/11/2078    #3 shortness of breath - acute on chronic diastolic CHF EF 56%, resolved after Lasix.   #4 vitamin D deficiency Patient with vitamin D 25-hydroxy level at 26.1. We'll place on oral vitamin D supplementation with calcium.  #5 vitamin B 12 deficiency Continue vitamin B-12 supplementation.  #6 iron deficiency anemia H&H stable. Patient would likely benefit from oral iron supplementation on discharge.  #7 hypocalcemia Corrected calcium 8.8 which is borderline. Vitamin D 25-hydroxy level at 26.1. Patient be started on vitamin D supplementation and calcium. Outpatient follow-up.     DVT prophylaxis: Lovenox Code Status: Partial Family Communication: Updated patient and daughter at bedside. Disposition Plan: Per primary team.   Consultants:   ID: Dr Linus Salmons: 10/21/2016  [facility]: Dr. Florene Glen 10/20/2016  PCCM: Dr. Belenda Cruise 10/20/2016  Dr. Posey Pronto ophthalmology   Procedures:   CXR 10/20/2016, 10/22/2016  ECHO 10/22/2016  Antimicrobials:   Oral cephalexin 10/19/2016>>>> 10/20/2016  IV Eraxis 10/20/2016>>>> 10/21/2016  IV Rocephin 10/19/2016 >>>> 10/20/2016  Irises ciprofloxacin 1 dose  IV Diflucan 10/21/2016   Subjective: Patient sitting up in bed with some complaints of some shortness of breath. Patient stated she was very anxious overnight. Patient denies any chest pain.  Objective: Vitals:   10/22/16 1330 10/22/16 1500 10/22/16 2117 10/23/16 0530  BP: (!) 130/56  137/67 (!) 152/67  Pulse: 84  85 84  Resp:   18 18  Temp: 99.5 F (37.5 C)  98 F (36.7 C) 98.2 F (36.8 C)  TempSrc: Oral  Oral Oral  SpO2: 100%  97% 98%  Weight:  72.8 kg (160 lb 7.9 oz)  75.4 kg (166 lb 3.6 oz)  Height:  Intake/Output Summary (Last 24 hours) at 10/23/16 1349 Last data filed at 10/23/16 0926  Gross per 24 hour  Intake          1088.41 ml  Output             1700 ml  Net          -611.59 ml   Filed Weights   10/19/16 1330 10/22/16 1500 10/23/16  0530  Weight: 71.2 kg (156 lb 15.5 oz) 72.8 kg (160 lb 7.9 oz) 75.4 kg (166 lb 3.6 oz)    Examination:  General exam: Appears calm and comfortable  Respiratory system: Some scattered crackles otherwise clear. No wheezes. No rhonchi. Respiratory effort normal. Cardiovascular system: S1 & S2 heard, RRR. No JVD, murmurs, rubs, gallops or clicks. No pedal edema. Gastrointestinal system: Abdomen is nondistended, soft and nontender. No organomegaly or masses felt. Normal bowel sounds heard. Central nervous system: Alert and oriented. No focal neurological deficits. Extremities: Symmetric 5 x 5 power. Skin: No rashes, lesions or ulcers Psychiatry: Judgement and insight appear normal. Mood & affect appropriate.     Data Reviewed: I have personally reviewed following labs and imaging studies  CBC:  Recent Labs Lab 10/19/16 1225 10/20/16 0317 10/21/16 0334 10/22/16 0430 10/23/16 0423  WBC 2.0* 15.0* 12.2* 10.0 7.3  NEUTROABS 1.8 13.4*  --   --   --   HGB 11.3* 10.6* 10.3* 11.0* 10.4*  HCT 35.4* 33.0* 31.9* 34.1* 31.8*  MCV 94.4 94.0 94.4 93.2 91.6  PLT 337 288 210 222 938   Basic Metabolic Panel:  Recent Labs Lab 10/19/16 1225 10/20/16 0317 10/21/16 0334 10/22/16 0430 10/23/16 0423  NA 140 138 136 137 139  K 3.8 4.0 3.7 3.6 3.0*  CL 104 105 105 103 103  CO2 26 25 24 24 28   GLUCOSE 80 121* 84 65 84  BUN 9 11 10 10 10   CREATININE 1.30* 1.30* 1.06* 0.92 0.81  CALCIUM 8.1* 7.5* 7.5* 7.6* 7.8*   GFR: Estimated Creatinine Clearance: 54.3 mL/min (by C-G formula based on SCr of 0.81 mg/dL). Liver Function Tests:  Recent Labs Lab 10/20/16 1330  ALBUMIN 2.3*   No results for input(s): LIPASE, AMYLASE in the last 168 hours. No results for input(s): AMMONIA in the last 168 hours. Coagulation Profile: No results for input(s): INR, PROTIME in the last 168 hours. Cardiac Enzymes: No results for input(s): CKTOTAL, CKMB, CKMBINDEX, TROPONINI in the last 168 hours. BNP (last 3  results) No results for input(s): PROBNP in the last 8760 hours. HbA1C: No results for input(s): HGBA1C in the last 72 hours. CBG: No results for input(s): GLUCAP in the last 168 hours. Lipid Profile: No results for input(s): CHOL, HDL, LDLCALC, TRIG, CHOLHDL, LDLDIRECT in the last 72 hours. Thyroid Function Tests: No results for input(s): TSH, T4TOTAL, FREET4, T3FREE, THYROIDAB in the last 72 hours. Anemia Panel:  Recent Labs  10/20/16 1407  VITAMINB12 130*  FOLATE 8.4  FERRITIN 147  TIBC 136*  IRON 5*   Sepsis Labs:  Recent Labs Lab 10/19/16 1225 10/20/16 1123 10/20/16 1407  LATICACIDVEN 1.6 1.6 1.6    Recent Results (from the past 240 hour(s))  Culture, blood (Routine X 2) w Reflex to ID Panel     Status: Abnormal   Collection Time: 10/19/16 12:33 PM  Result Value Ref Range Status   Specimen Description BLOOD LEFT HAND  Final   Special Requests IN PEDIATRIC BOTTLE Blood Culture adequate volume  Final   Culture  Setup  Time   Final    YEAST IN PEDIATRIC BOTTLE CRITICAL VALUE NOTED.  VALUE IS CONSISTENT WITH PREVIOUSLY REPORTED AND CALLED VALUE. Performed at [facility], [place] [address] [place], [place] [address]    Culture CANDIDA ALBICANS (A)  Final   Report Status 10/23/2016 FINAL  Final  Culture, blood (Routine X 2) w Reflex to ID Panel     Status: Abnormal   Collection Time: 10/19/16 12:34 PM  Result Value Ref Range Status   Specimen Description BLOOD RIGHT ARM  Final   Special Requests   Final    BOTTLES DRAWN AEROBIC AND ANAEROBIC Blood Culture adequate volume   Culture  Setup Time   Final    YEAST IN BOTH AEROBIC AND ANAEROBIC BOTTLES CRITICAL RESULT CALLED TO, READ BACK BY AND VERIFIED WITH: A [facility] PHARMD 1903 10/20/16 A BROWNING Performed at [facility], [place] [address], [place], [place] [address]    Culture CANDIDA ALBICANS (A)  Final   Report Status 10/23/2016 FINAL  Final  Blood Culture ID Panel (Reflexed)     Status: Abnormal    Collection Time: 10/19/16 12:34 PM  Result Value Ref Range Status   Enterococcus species NOT DETECTED NOT DETECTED Final   Listeria monocytogenes NOT DETECTED NOT DETECTED Final   Staphylococcus species NOT DETECTED NOT DETECTED Final   Staphylococcus aureus NOT DETECTED NOT DETECTED Final   Streptococcus species NOT DETECTED NOT DETECTED Final   Streptococcus agalactiae NOT DETECTED NOT DETECTED Final   Streptococcus pneumoniae NOT DETECTED NOT DETECTED Final   Streptococcus pyogenes NOT DETECTED NOT DETECTED Final   Acinetobacter baumannii NOT DETECTED NOT DETECTED Final   Enterobacteriaceae species NOT DETECTED NOT DETECTED Final   Enterobacter cloacae complex NOT DETECTED NOT DETECTED Final   Escherichia coli NOT DETECTED NOT DETECTED Final   Klebsiella oxytoca NOT DETECTED NOT DETECTED Final   Klebsiella pneumoniae NOT DETECTED NOT DETECTED Final   Proteus species NOT DETECTED NOT DETECTED Final   Serratia marcescens NOT DETECTED NOT DETECTED Final   Haemophilus influenzae NOT DETECTED NOT DETECTED Final   Neisseria meningitidis NOT DETECTED NOT DETECTED Final   Pseudomonas aeruginosa NOT DETECTED NOT DETECTED Final   Candida albicans DETECTED (A) NOT DETECTED Final    Comment: CRITICAL RESULT CALLED TO, READ BACK BY AND VERIFIED WITH: A [facility] PHARMD 1903 10/20/16 A BROWNING    Candida glabrata NOT DETECTED NOT DETECTED Final   Candida krusei NOT DETECTED NOT DETECTED Final   Candida parapsilosis NOT DETECTED NOT DETECTED Final   Candida tropicalis NOT DETECTED NOT DETECTED Final    Comment: Performed at [facility], [address] [place], [place] [address]  MRSA PCR Screening     Status: None   Collection Time: 10/19/16  1:56 PM  Result Value Ref Range Status   MRSA by PCR NEGATIVE NEGATIVE Final    Comment:        The GeneXpert MRSA Assay (FDA approved for NASAL specimens only), is one component of a comprehensive MRSA colonization surveillance program. It is  not intended to diagnose MRSA infection nor to guide or monitor treatment for MRSA infections.   Culture, Urine     Status: Abnormal   Collection Time: 10/20/16  2:05 PM  Result Value Ref Range Status   Specimen Description URINE, CLEAN CATCH  Final   Special Requests NONE  Final   Culture MULTIPLE SPECIES PRESENT, SUGGEST RECOLLECTION (A)  Final   Report Status 10/21/2016 FINAL  Final  Culture, blood (routine x 2)     Status: None (Preliminary result)   Collection Time: 10/21/16 12:29 PM  Result Value Ref Range Status   Specimen Description BLOOD RIGHT ARM  Final   Special Requests   Final    BOTTLES DRAWN AEROBIC AND ANAEROBIC Blood Culture adequate volume   Culture   Final    NO GROWTH < 24 HOURS Performed at [facility], [address] [place], [place] [address]    Report Status PENDING  Incomplete  Culture, blood (routine x 2)     Status: None (Preliminary result)   Collection Time: 10/21/16 12:29 PM  Result Value Ref Range Status   Specimen Description BLOOD RIGHT HAND  Final   Special Requests IN PEDIATRIC BOTTLE Blood Culture adequate volume  Final   Culture   Final    NO GROWTH < 24 HOURS Performed at [facility], [place] [address], [place],  [address]    Report Status PENDING  Incomplete         Radiology Studies: Dg Chest 2 View  Result Date: 10/22/2016 CLINICAL DATA:  Shortness of breath.  Fatigue. EXAM: CHEST  2 VIEW COMPARISON:  10/20/2016 . FINDINGS: Cardiomegaly with pulmonary vascular prominence and bilateral interstitial prominence bilateral pleural effusions. Findings consistent CHF. Prominence sliding hiatal hernia. No pneumothorax. IMPRESSION: 1. Congestive heart failure with bilateral pulmonary interstitial edema and bilateral pleural effusions. 2. Sliding hiatal hernia. Electronically Signed   By: Marcello Moores  Register   On: 10/22/2016 15:58        Scheduled Meds: . calcium carbonate  2 tablet Oral TID WC  . cholecalciferol   800 Units Oral Daily  . cyanocobalamin  1,000 mcg Intramuscular q morning - 10a  . enoxaparin (LOVENOX) injection  40 mg Subcutaneous Q24H  . fluconazole  400 mg Oral Daily  . latanoprost  1 drop Both Eyes QHS  . nystatin  5 mL Oral QID  . potassium chloride  40 mEq Oral Q6H   Continuous Infusions: . sodium chloride 10 mL/hr at 10/22/16 1349     LOS: 4 days    Time spent: 35 minutes   Signature  Lala Lund M.D on 10/23/2016 at 1:49 PM  Between 7am to 7pm - Pager - [phone] ( page via [email or web address], text pages only, please mention full 10 digit call back number).  After 7pm go to [email or web address] - password [facility]

## 2016-10-23 NOTE — Progress Notes (Signed)
Urology Inpatient Progress Report  RIGHT URETERAL STONE  Procedure(s): CYSTOSCOPY/URETEROSCOPY/HOLMIUM LASER/STENT EXCHANGE, 4 Days Post-Op   Intv/Subj: No acute events overnight. Feels improved, but still weak. Awaiting placement to SNF.   Principal Problem:   Right ureteral calculus Active Problems:   H/O cystoscopy   Leukocytosis   Hypotension   Anemia   Hypocalcemia   Vitamin D deficiency   Sepsis due to Candida (HCC)   Fungemia: Candida  Current Facility-Administered Medications  Medication Dose Route Frequency Provider Last Rate Last Dose  . 0.9 %  sodium chloride infusion   Intravenous Continuous Eugenie Filler, MD 10 mL/hr at 10/22/16 1349    . acetaminophen (TYLENOL) tablet 650 mg  650 mg Oral Q4H PRN Irine Seal, MD   650 mg at 10/20/16 0837  . bisacodyl (DULCOLAX) suppository 10 mg  10 mg Rectal Daily PRN Irine Seal, MD      . calcium carbonate (OS-CAL - dosed in mg of elemental calcium) tablet 1,000 mg of elemental calcium  2 tablet Oral TID WC Eugenie Filler, MD      . cholecalciferol (VITAMIN D) tablet 800 Units  800 Units Oral Daily Eugenie Filler, MD   800 Units at 10/22/16 1333  . cyanocobalamin ((VITAMIN B-12)) injection 1,000 mcg  1,000 mcg Intramuscular q morning - 10a Eugenie Filler, MD      . enoxaparin (LOVENOX) injection 40 mg  40 mg Subcutaneous Q24H Reuel Boom A, RPH   40 mg at 10/22/16 1333  . fentaNYL (SUBLIMAZE) injection 25 mcg  25 mcg Intravenous Q2H PRN Sampson Goon, MD   25 mcg at 10/21/16 1748  . fluconazole (DIFLUCAN) IVPB 200 mg  200 mg Intravenous Q24H Irine Seal, MD   Stopped at 10/22/16 1554  . hyoscyamine (LEVSIN SL) SL tablet 0.125 mg  0.125 mg Sublingual Q4H PRN Irine Seal, MD      . lactated ringers infusion   Intravenous Continuous Sampson Goon, MD 125 mL/hr at 10/21/16 0500    . latanoprost (XALATAN) 0.005 % ophthalmic solution 1 drop  1 drop Both Eyes QHS Irine Seal, MD   1 drop at 10/22/16 2243  .  loperamide (IMODIUM) capsule 2 mg  2 mg Oral PRN Irine Seal, MD      . LORazepam (ATIVAN) tablet 0.5 mg  0.5 mg Oral BID PRN Irine Seal, MD   0.5 mg at 10/22/16 0654  . nystatin (MYCOSTATIN) 100000 UNIT/ML suspension 500,000 Units  5 mL Oral QID Reyne Dumas, MD   500,000 Units at 10/22/16 2250  . ondansetron (ZOFRAN) injection 4 mg  4 mg Intravenous Q4H PRN Irine Seal, MD   4 mg at 10/21/16 1903  . potassium chloride SA (K-DUR,KLOR-CON) CR tablet 40 mEq  40 mEq Oral Q6H Singh, Margaree Mackintosh, MD      . senna-docusate (Senokot-S) tablet 1 tablet  1 tablet Oral QHS PRN Irine Seal, MD      . sodium phosphate (FLEET) 7-19 GM/118ML enema 1 enema  1 enema Rectal Once PRN Irine Seal, MD      . traMADol Veatrice Bourbon) tablet 50-100 mg  50-100 mg Oral Q6H PRN Irine Seal, MD   50 mg at 10/20/16 2347  . zolpidem (AMBIEN) tablet 5 mg  5 mg Oral QHS PRN Irine Seal, MD   5 mg at 10/20/16 2347     Objective: Vital: Vitals:   10/22/16 1330 10/22/16 1500 10/22/16 2117 10/23/16 0530  BP: (!) 130/56  137/67 (!) 152/67  Pulse:  84  85 84  Resp:   18 18  Temp: 99.5 F (37.5 C)  98 F (36.7 C) 98.2 F (36.8 C)  TempSrc: Oral  Oral Oral  SpO2: 100%  97% 98%  Weight:  72.8 kg (160 lb 7.9 oz)  75.4 kg (166 lb 3.6 oz)  Height:       I/Os: I/O last 3 completed shifts: In: 3329.7 [P.O.:120; I.V.:3109.7; IV Piggyback:100] Out: 2350 [Urine:2350]  Physical Exam:  General: Patient is in no apparent distress Lungs: Normal respiratory effort, chest expands symmetrically. GI: The abdomen is soft and nontender without mass. GU: urine clear yellow Ext: lower extremities symmetric  Lab Results:  Recent Labs  10/21/16 0334 10/22/16 0430 10/23/16 0423  WBC 12.2* 10.0 7.3  HGB 10.3* 11.0* 10.4*  HCT 31.9* 34.1* 31.8*    Recent Labs  10/21/16 0334 10/22/16 0430 10/23/16 0423  NA 136 137 139  K 3.7 3.6 3.0*  CL 105 103 103  CO2 24 24 28   GLUCOSE 84 65 84  BUN 10 10 10   CREATININE 1.06* 0.92 0.81   CALCIUM 7.5* 7.6* 7.8*    Assessment: 83 y.o. female with sepsis and candida fungemia after ureteroscopy 10/19/16. Improving on culture-specific fluconazole. Afebrile with resolved leukocytosis. Cr WNL. Main issue at this point is deconditioning and recovering functional status after acute infection and procedure under general anesthesia. Though she is improving, she will need rehab placement at discharge, with continued PT in house.   Plan: Has been switched to PO fluconazole, continues to be afebrile.  Appreciate ID assistance. F/U repeat Bcx drawn 10/21/16 Continue ensure to trays given poor PO intake Working with case Freight forwarder for rehab placement. Continue PT while in house.  Plan for stent removal next week by Dr [name] either here or in office

## 2016-10-24 LAB — MAGNESIUM: Magnesium: 1.7 mg/dL (ref 1.7–2.4)

## 2016-10-24 MED ORDER — ENSURE ENLIVE PO LIQD
1.0000 | Freq: Three times a day (TID) | ORAL | Status: DC
  Administered 2016-10-24 – 2016-10-27 (×6): 237 mL via ORAL

## 2016-10-24 MED ORDER — FLUCONAZOLE 40 MG/ML PO SUSR: 400.0000 mg | Freq: Every day | ORAL | 0 refills | Status: DC

## 2016-10-24 MED ORDER — SENNOSIDES-DOCUSATE SODIUM 8.6-50 MG PO TABS: 1.0000 | ORAL_TABLET | Freq: Every evening | ORAL | 11 refills | Status: AC | PRN

## 2016-10-24 MED ORDER — FLUCONAZOLE 40 MG/ML PO SUSR
400.0000 mg | Freq: Every day | ORAL | Status: DC
  Administered 2016-10-24 – 2016-10-27 (×3): 400 mg via ORAL
  Filled 2016-10-24 (×4): qty 10

## 2016-10-24 MED ORDER — ENSURE ENLIVE PO LIQD: 1.0000 | Freq: Three times a day (TID) | ORAL | 12 refills | Status: AC

## 2016-10-24 NOTE — Progress Notes (Addendum)
Urology Inpatient Progress Report  RIGHT URETERAL STONE  Procedure(s): CYSTOSCOPY/URETEROSCOPY/HOLMIUM LASER/STENT EXCHANGE, 5 Days Post-Op   Intv/Subj: No acute events overnight. Feels improved, but still weak. Awaiting placement to SNF. Complains of poor appetite and difficulty swallowing pills.   Principal Problem:   Right ureteral calculus Active Problems:   H/O cystoscopy   Leukocytosis   Hypotension   Anemia   Hypocalcemia   Vitamin D deficiency   Sepsis due to Candida (HCC)   Fungemia: Candida  Current Facility-Administered Medications  Medication Dose Route Frequency Provider Last Rate Last Dose  . 0.9 %  sodium chloride infusion   Intravenous Continuous Eugenie Filler, MD 10 mL/hr at 10/22/16 1349    . acetaminophen (TYLENOL) tablet 650 mg  650 mg Oral Q4H PRN Irine Seal, MD   650 mg at 10/20/16 0837  . bisacodyl (DULCOLAX) suppository 10 mg  10 mg Rectal Daily PRN Irine Seal, MD      . calcium carbonate (OS-CAL - dosed in mg of elemental calcium) tablet 1,000 mg of elemental calcium  2 tablet Oral TID WC Eugenie Filler, MD   1,000 mg of elemental calcium at 10/23/16 1754  . cholecalciferol (VITAMIN D) tablet 800 Units  800 Units Oral Daily Eugenie Filler, MD   800 Units at 10/23/16 1255  . cyanocobalamin ((VITAMIN B-12)) injection 1,000 mcg  1,000 mcg Intramuscular q morning - 10a Eugenie Filler, MD   1,000 mcg at 10/23/16 1311  . enoxaparin (LOVENOX) injection 40 mg  40 mg Subcutaneous Q24H Polly Cobia, RPH   40 mg at 10/23/16 1505  . fentaNYL (SUBLIMAZE) injection 25 mcg  25 mcg Intravenous Q2H PRN Sampson Goon, MD   25 mcg at 10/21/16 1748  . fluconazole (DIFLUCAN) tablet 400 mg  400 mg Oral Daily Stasia Cavalier, MD   400 mg at 10/23/16 1304  . hyoscyamine (LEVSIN SL) SL tablet 0.125 mg  0.125 mg Sublingual Q4H PRN Irine Seal, MD      . latanoprost (XALATAN) 0.005 % ophthalmic solution 1 drop  1 drop Both Eyes QHS Irine Seal, MD   1  drop at 10/23/16 2227  . loperamide (IMODIUM) capsule 2 mg  2 mg Oral PRN Irine Seal, MD      . LORazepam (ATIVAN) tablet 0.5 mg  0.5 mg Oral BID PRN Irine Seal, MD   0.5 mg at 10/22/16 0654  . nystatin (MYCOSTATIN) 100000 UNIT/ML suspension 500,000 Units  5 mL Oral QID Reyne Dumas, MD   500,000 Units at 10/23/16 2225  . ondansetron (ZOFRAN) injection 4 mg  4 mg Intravenous Q4H PRN Irine Seal, MD   4 mg at 10/21/16 1903  . senna-docusate (Senokot-S) tablet 1 tablet  1 tablet Oral QHS PRN Irine Seal, MD      . sodium phosphate (FLEET) 7-19 GM/118ML enema 1 enema  1 enema Rectal Once PRN Irine Seal, MD      . traMADol Veatrice Bourbon) tablet 50-100 mg  50-100 mg Oral Q6H PRN Irine Seal, MD   50 mg at 10/23/16 2221  . zolpidem (AMBIEN) tablet 5 mg  5 mg Oral QHS PRN Irine Seal, MD   5 mg at 10/23/16 2221     Objective: Vital: Vitals:   10/23/16 1407 10/23/16 1930 10/23/16 2210 10/24/16 0540  BP: (!) 135/54  140/65 139/66  Pulse: 77  87 85  Resp: 18  18 18   Temp: 99.1 F (37.3 C)  99.1 F (37.3 C) 97.8 F (36.6 C)  TempSrc: Oral  Oral Oral  SpO2:  94% 91% 91%  Weight:    74 kg (163 lb 2.3 oz)  Height:       I/Os: I/O last 3 completed shifts: In: 410 [P.O.:120; I.V.:290] Out: 2000 [Urine:2000]  Physical Exam:  General: Patient is in no apparent distress Lungs: Normal respiratory effort, chest expands symmetrically. CV: Regular rate GI: The abdomen is soft and nontender without mass. Ext: lower extremities symmetric  Lab Results:  Recent Labs  10/22/16 0430 10/23/16 0423  WBC 10.0 7.3  HGB 11.0* 10.4*  HCT 34.1* 31.8*    Recent Labs  10/22/16 0430 10/23/16 0423 10/24/16 0446  NA 137 139 140  K 3.6 3.0* 3.9  CL 103 103 105  CO2 24 28 29   GLUCOSE 65 84 79  BUN 10 10 8   CREATININE 0.92 0.81 0.82  CALCIUM 7.6* 7.8* 8.1*    Assessment: 83 y.o. female with sepsis and candida fungemia after ureteroscopy 10/19/16. Improving on culture-specific fluconazole.  Afebrile with resolved leukocytosis. Cr WNL. Main issue at this point is deconditioning and recovering functional status after acute infection and procedure under general anesthesia. Though she is improving, she will need rehab placement at discharge, with continued PT in house.   Plan: Has been switched to PO fluconazole, continues to be afebrile.  Appreciate ID assistance Ensure with meals given poor appetite. Will change to fluconazole liquid given difficulty swallowing pills. Fluconazole 400mg  daily to be continued through 11/10/16. Rx for liquid fluconazole placed in chart.  F/U repeat Bcx drawn 10/21/16 Continue ensure to trays given poor PO intake Working with case Freight forwarder for rehab placement. Continue PT while in house.  Plan for stent removal next week by Dr [name] either here or in office

## 2016-10-24 NOTE — Progress Notes (Signed)
Followed up with SW. SW has seen pt this morning. Pt up in chair with son in room.

## 2016-10-24 NOTE — Progress Notes (Signed)
CONSULT/PROGRESS NOTE    Hospitalist team will sign off kindly call with any questions.     Caroline Griffith  ZOX:[id number] DOB: Jun 02, 1930 DOA: 10/19/2016 PCP: Lavone Orn, MD   Brief Narrative:  Caroline Griffith an 83 y.o.femalewith a history of kidney stones, macular degeneration, varicose veins, OA, presenting after ureteroscopy per urology. She was admitted due to fever post op. TRH consulted due to hypotension.   She was recently discharged on 9/24 for a right UPJ stone with associated hydronephrosis and a urinary tract infection with pansensitive Escherichia coli. She was discharged on Keflex for 7 days and then took prophylaxis doses nightly until she followed up with urology. On 10/9 she had ureteroscopy with laser and stent removal and replacement. She was admitted due to fever postop and hypotension.   Assessment & Plan:    #1 right ureteral calculus status post cystoscopy/sepsis secondary to fungemia anemia/hypotension -  Patient status post recent admission to [facility] with 8 mm R UP J calculus. Status post right ureteral stent placed 10/02/2016, with CYSTOSCOPY/URETEROSCOPY/HOLMIUM LASER/STENT EXCHANGE 10/19/2016, treated with appropriate antibiotics and now antibiotics have been stopped. Seen by ID. Has candidemia for which she had ophthalmology evaluation as well. Currently on fluconazole which per ID she has to continue 400 mg oral daily with a stop date of October 31 of 2018. Kindly arrange for outpatient ID follow-up with Dr. Linus Salmons 7-10 days after surgery.  #2 Candida albicans fungemia  Patient with 2/2 blood cultures positive for Candida albicans the cystoscopy with stent placement 10/19/2016. Patient has been seen in consultation by ID, IV Rocephin discontinued, patient placed on  fluconazole, 2-D echo done showing no evidence of vegetation, repeat blood cultures so far negative, ophthalmology also saw the patient with a negative eye exam. Per ID can be switched  to oral fluconazole at the time of discharge with a finish date of 11/10/2016.  #3 Shortness of breath - acute on chronic diastolic CHF EF 81%, resolved after Lasix.   #4 vitamin D deficiency  Patient with vitamin D 25-hydroxy level at 26.1. We'll place on oral vitamin D supplementation with calcium.  #5 vitamin B 12 deficiency  Continue vitamin B-12 supplementation.  #6 iron deficiency anemia  H&H stable. Patient would likely benefit from oral iron supplementation on discharge.  #7 hypocalcemia  Corrected calcium 8.8 which is borderline. Vitamin D 25-hydroxy level at 26.1. Patient be started on vitamin D supplementation and calcium. Outpatient follow-up with PCP post DC.  #8. Chronic back pain. Stable. Supportive care. This is chronic.   DVT prophylaxis: Lovenox Code Status: Partial Family Communication: Updated patient and daughter at bedside. Disposition Plan: Per primary team.   Consultants:   ID: Dr Linus Salmons: 10/21/2016  [facility]: Dr. Florene Glen 10/20/2016  PCCM: Dr. Belenda Cruise 10/20/2016  Dr. Posey Pronto ophthalmology   Procedures:   CXR 10/20/2016, 10/22/2016  ECHO 10/22/2016  Antimicrobials:   Oral cephalexin 10/19/2016>>>> 10/20/2016  IV Eraxis 10/20/2016>>>> 10/21/2016  IV Rocephin 10/19/2016 >>>> 10/20/2016  Irises ciprofloxacin 1 dose  IV Diflucan 10/21/2016   Subjective: Patient sitting up in bed with some complaints of some shortness of breath. Patient stated she was very anxious overnight. Patient denies any chest pain.  Objective: Vitals:   10/23/16 1407 10/23/16 1930 10/23/16 2210 10/24/16 0540  BP: (!) 135/54  140/65 139/66  Pulse: 77  87 85  Resp: 18  18 18   Temp: 99.1 F (37.3 C)  99.1 F (37.3 C) 97.8 F (36.6 C)  TempSrc: Oral  Oral  Oral  SpO2:  94% 91% 91%  Weight:    74 kg (163 lb 2.3 oz)  Height:        Intake/Output Summary (Last 24 hours) at 10/24/16 1024 Last data filed at 10/24/16 0545  Gross per 24 hour  Intake               120 ml  Output              500 ml  Net             -380 ml   Filed Weights   10/22/16 1500 10/23/16 0530 10/24/16 0540  Weight: 72.8 kg (160 lb 7.9 oz) 75.4 kg (166 lb 3.6 oz) 74 kg (163 lb 2.3 oz)    Examination:  General exam: Appears calm and comfortable  Respiratory system: Some scattered crackles otherwise clear. No wheezes. No rhonchi. Respiratory effort normal. Cardiovascular system: S1 & S2 heard, RRR. No JVD, murmurs, rubs, gallops or clicks. No pedal edema. Gastrointestinal system: Abdomen is nondistended, soft and nontender. No organomegaly or masses felt. Normal bowel sounds heard. Central nervous system: Alert and oriented. No focal neurological deficits. Extremities: Symmetric 5 x 5 power. Skin: No rashes, lesions or ulcers Psychiatry: Judgement and insight appear normal. Mood & affect appropriate.     Data Reviewed: I have personally reviewed following labs and imaging studies  CBC:  Recent Labs Lab 10/19/16 1225 10/20/16 0317 10/21/16 0334 10/22/16 0430 10/23/16 0423  WBC 2.0* 15.0* 12.2* 10.0 7.3  NEUTROABS 1.8 13.4*  --   --   --   HGB 11.3* 10.6* 10.3* 11.0* 10.4*  HCT 35.4* 33.0* 31.9* 34.1* 31.8*  MCV 94.4 94.0 94.4 93.2 91.6  PLT 337 288 210 222 865   Basic Metabolic Panel:  Recent Labs Lab 10/20/16 0317 10/21/16 0334 10/22/16 0430 10/23/16 0423 10/24/16 0446  NA 138 136 137 139 140  K 4.0 3.7 3.6 3.0* 3.9  CL 105 105 103 103 105  CO2 25 24 24 28 29   GLUCOSE 121* 84 65 84 79  BUN 11 10 10 10 8   CREATININE 1.30* 1.06* 0.92 0.81 0.82  CALCIUM 7.5* 7.5* 7.6* 7.8* 8.1*  MG  --   --   --   --  1.7   GFR: Estimated Creatinine Clearance: 53.1 mL/min (by C-G formula based on SCr of 0.82 mg/dL). Liver Function Tests:  Recent Labs Lab 10/20/16 1330  ALBUMIN 2.3*   No results for input(s): LIPASE, AMYLASE in the last 168 hours. No results for input(s): AMMONIA in the last 168 hours. Coagulation Profile: No results for input(s): INR,  PROTIME in the last 168 hours. Cardiac Enzymes: No results for input(s): CKTOTAL, CKMB, CKMBINDEX, TROPONINI in the last 168 hours. BNP (last 3 results) No results for input(s): PROBNP in the last 8760 hours. HbA1C: No results for input(s): HGBA1C in the last 72 hours. CBG: No results for input(s): GLUCAP in the last 168 hours. Lipid Profile: No results for input(s): CHOL, HDL, LDLCALC, TRIG, CHOLHDL, LDLDIRECT in the last 72 hours. Thyroid Function Tests: No results for input(s): TSH, T4TOTAL, FREET4, T3FREE, THYROIDAB in the last 72 hours. Anemia Panel: No results for input(s): VITAMINB12, FOLATE, FERRITIN, TIBC, IRON, RETICCTPCT in the last 72 hours. Sepsis Labs:  Recent Labs Lab 10/19/16 1225 10/20/16 1123 10/20/16 1407  LATICACIDVEN 1.6 1.6 1.6    Recent Results (from the past 240 hour(s))  Culture, blood (Routine X 2) w Reflex to ID Panel  Status: Abnormal   Collection Time: 10/19/16 12:33 PM  Result Value Ref Range Status   Specimen Description BLOOD LEFT HAND  Final   Special Requests IN PEDIATRIC BOTTLE Blood Culture adequate volume  Final   Culture  Setup Time   Final    YEAST IN PEDIATRIC BOTTLE CRITICAL VALUE NOTED.  VALUE IS CONSISTENT WITH PREVIOUSLY REPORTED AND CALLED VALUE. Performed at [facility], [place] [address], [place], [place] [address]    Culture CANDIDA ALBICANS (A)  Final   Report Status 10/23/2016 FINAL  Final  Culture, blood (Routine X 2) w Reflex to ID Panel     Status: Abnormal   Collection Time: 10/19/16 12:34 PM  Result Value Ref Range Status   Specimen Description BLOOD RIGHT ARM  Final   Special Requests   Final    BOTTLES DRAWN AEROBIC AND ANAEROBIC Blood Culture adequate volume   Culture  Setup Time   Final    YEAST IN BOTH AEROBIC AND ANAEROBIC BOTTLES CRITICAL RESULT CALLED TO, READ BACK BY AND VERIFIED WITH: A [facility] PHARMD 1903 10/20/16 A BROWNING Performed at [facility], [place] [address] [place], [place]  [address]    Culture CANDIDA ALBICANS (A)  Final   Report Status 10/23/2016 FINAL  Final  Blood Culture ID Panel (Reflexed)     Status: Abnormal   Collection Time: 10/19/16 12:34 PM  Result Value Ref Range Status   Enterococcus species NOT DETECTED NOT DETECTED Final   Listeria monocytogenes NOT DETECTED NOT DETECTED Final   Staphylococcus species NOT DETECTED NOT DETECTED Final   Staphylococcus aureus NOT DETECTED NOT DETECTED Final   Streptococcus species NOT DETECTED NOT DETECTED Final   Streptococcus agalactiae NOT DETECTED NOT DETECTED Final   Streptococcus pneumoniae NOT DETECTED NOT DETECTED Final   Streptococcus pyogenes NOT DETECTED NOT DETECTED Final   Acinetobacter baumannii NOT DETECTED NOT DETECTED Final   Enterobacteriaceae species NOT DETECTED NOT DETECTED Final   Enterobacter cloacae complex NOT DETECTED NOT DETECTED Final   Escherichia coli NOT DETECTED NOT DETECTED Final   Klebsiella oxytoca NOT DETECTED NOT DETECTED Final   Klebsiella pneumoniae NOT DETECTED NOT DETECTED Final   Proteus species NOT DETECTED NOT DETECTED Final   Serratia marcescens NOT DETECTED NOT DETECTED Final   Haemophilus influenzae NOT DETECTED NOT DETECTED Final   Neisseria meningitidis NOT DETECTED NOT DETECTED Final   Pseudomonas aeruginosa NOT DETECTED NOT DETECTED Final   Candida albicans DETECTED (A) NOT DETECTED Final    Comment: CRITICAL RESULT CALLED TO, READ BACK BY AND VERIFIED WITH: A [facility] PHARMD 1903 10/20/16 A BROWNING    Candida glabrata NOT DETECTED NOT DETECTED Final   Candida krusei NOT DETECTED NOT DETECTED Final   Candida parapsilosis NOT DETECTED NOT DETECTED Final   Candida tropicalis NOT DETECTED NOT DETECTED Final    Comment: Performed at [facility], [address], [place], [place] [address]  MRSA PCR Screening     Status: None   Collection Time: 10/19/16  1:56 PM  Result Value Ref Range Status   MRSA by PCR NEGATIVE NEGATIVE Final    Comment:        The  GeneXpert MRSA Assay (FDA approved for NASAL specimens only), is one component of a comprehensive MRSA colonization surveillance program. It is not intended to diagnose MRSA infection nor to guide or monitor treatment for MRSA infections.   Culture, Urine     Status: Abnormal   Collection Time: 10/20/16  2:05  PM  Result Value Ref Range Status   Specimen Description URINE, CLEAN CATCH  Final   Special Requests NONE  Final   Culture MULTIPLE SPECIES PRESENT, SUGGEST RECOLLECTION (A)  Final   Report Status 10/21/2016 FINAL  Final  Culture, blood (routine x 2)     Status: None (Preliminary result)   Collection Time: 10/21/16 12:29 PM  Result Value Ref Range Status   Specimen Description BLOOD RIGHT ARM  Final   Special Requests   Final    BOTTLES DRAWN AEROBIC AND ANAEROBIC Blood Culture adequate volume   Culture   Final    NO GROWTH 2 DAYS Performed at [facility], [address] [place], [place] [address]    Report Status PENDING  Incomplete  Culture, blood (routine x 2)     Status: None (Preliminary result)   Collection Time: 10/21/16 12:29 PM  Result Value Ref Range Status   Specimen Description BLOOD RIGHT HAND  Final   Special Requests IN PEDIATRIC BOTTLE Blood Culture adequate volume  Final   Culture   Final    NO GROWTH 2 DAYS Performed at [facility], [place] [address] [place], [place] [address]    Report Status PENDING  Incomplete         Radiology Studies: Dg Chest 2 View  Result Date: 10/22/2016 CLINICAL DATA:  Shortness of breath.  Fatigue. EXAM: CHEST  2 VIEW COMPARISON:  10/20/2016 . FINDINGS: Cardiomegaly with pulmonary vascular prominence and bilateral interstitial prominence bilateral pleural effusions. Findings consistent CHF. Prominence sliding hiatal hernia. No pneumothorax. IMPRESSION: 1. Congestive heart failure with bilateral pulmonary interstitial edema and bilateral pleural effusions. 2. Sliding hiatal hernia. Electronically Signed    By: Marcello Moores  Register   On: 10/22/2016 15:58        Scheduled Meds: . calcium carbonate  2 tablet Oral TID WC  . cholecalciferol  800 Units Oral Daily  . cyanocobalamin  1,000 mcg Intramuscular q morning - 10a  . enoxaparin (LOVENOX) injection  40 mg Subcutaneous Q24H  . feeding supplement (ENSURE ENLIVE)  1 Bottle Oral TID BM  . fluconazole  400 mg Oral Daily  . latanoprost  1 drop Both Eyes QHS  . nystatin  5 mL Oral QID   Continuous Infusions:    LOS: 5 days    Time spent: 35 minutes   Signature  Lala Lund M.D on 10/24/2016 at 10:24 AM  Between 7am to 7pm - Pager - [phone] ( page via [email or web address], text pages only, please mention full 10 digit call back number).  After 7pm go to [email or web address] - password [facility]

## 2016-10-24 NOTE — Clinical Social Work Note (Signed)
Clinical Social Work Assessment  Patient Details  Name: Caroline Griffith MRN: [id number] Date of Birth: 23-Aug-1930  Date of referral:  10/24/16               Reason for consult:  Facility Placement                Permission sought to share information with:  Chartered certified accountant granted to share information::  Yes, Verbal Permission Granted  Name::     Metallurgist::  SNF  Relationship::  son  Contact Information:     Housing/Transportation Living arrangements for the past 2 months:  [place] of Information:  Patient, Adult Children Patient Interpreter Needed:  None Criminal Activity/Legal Involvement Pertinent to Current Situation/Hospitalization:  No - Comment as needed Significant Relationships:  Adult Children Lives with:  Self Do you feel safe going back to the place where you live?  No Need for family participation in patient care:  No (Coment)  Care giving concerns:  Pt lives at home alone- concern about returning alone in current condition.   Social Worker assessment / plan:  CSW spoke with pt concerning PT recommendation for SNF.  Explained SNF and SNF referral process.  Employment status:  Retired Nurse, adult PT Recommendations:  [place] / Referral to community resources:  [place]  Patient/Family's Response to care:  Pt agreeable to short term rehab stay.  Patient/Family's Understanding of and Emotional Response to Diagnosis, Current Treatment, and Prognosis:  Pt and son express understanding of current condition and care needs- pt hopeful to return home soon.  Emotional Assessment Appearance:  Appears stated age Attitude/Demeanor/Rapport:    Affect (typically observed):  Appropriate Orientation:  Oriented to Situation, Oriented to  Time, Oriented to Place, Oriented to Self Alcohol / Substance use:  Not Applicable Psych involvement (Current and /or in  the community):  No (Comment)  Discharge Needs  Concerns to be addressed:  Care Coordination Readmission within the last 30 days:  Yes Current discharge risk:  Physical Impairment Barriers to Discharge:  Continued Medical Work up   Jorge Ny, LCSW 10/24/2016, 4:14 PM

## 2016-10-24 NOTE — NC FL2 (Signed)
[place] LEVEL OF CARE SCREENING TOOL     IDENTIFICATION  Patient Name: Caroline Griffith Birthdate: 07/17/1930 Sex: female Admission Date (Current Location): 10/19/2016  [facility] and [place] Number:  Herbalist and Address:  [facility], [place] [address] [place], [place] [address]      Provider Number: [id number]  Attending Physician Name and Address:  Irine Seal, MD  Relative Name and Phone Number:       Current Level of Care: Hospital Recommended Level of Care: [place] Prior Approval Number:    Date Approved/Denied:   PASRR Number: [id number]  Discharge Plan: SNF    Current Diagnoses: Patient Active Problem List   Diagnosis Date Noted  . Vitamin D deficiency 10/22/2016  . Sepsis due to Candida ([place]) 10/22/2016  . Fungemia: Candida 10/22/2016  . Leukocytosis 10/20/2016  . Hypotension 10/20/2016  . Anemia 10/20/2016  . Hypocalcemia 10/20/2016  . Right ureteral calculus 10/19/2016  . H/O cystoscopy 10/19/2016  . Nephrolithiasis 10/01/2016  . Renal insufficiency 10/01/2016  . Lower urinary tract infectious disease   . Stone, kidney 08/15/2014  . Right nephrolithiasis 08/15/2014  . Pulmonary nodules 03/20/2014  . SOB (shortness of breath) 03/19/2014    Orientation RESPIRATION BLADDER Height & Weight     Self, Time, Situation, Place  Normal Incontinent, External catheter Weight: 163 lb 2.3 oz (74 kg) Height:  5' 6.5" (168.9 cm)  BEHAVIORAL SYMPTOMS/MOOD NEUROLOGICAL BOWEL NUTRITION STATUS      Continent Diet (see DC summary)  AMBULATORY STATUS COMMUNICATION OF NEEDS Skin   Extensive Assist Verbally Normal                       Personal Care Assistance Level of Assistance  Bathing, Dressing Bathing Assistance: Maximum assistance   Dressing Assistance: Maximum assistance     Functional Limitations Info             SPECIAL CARE FACTORS FREQUENCY  PT (By licensed PT), OT (By  licensed OT)     PT Frequency: 5/wk OT Frequency: 5/wk            Contractures      Additional Factors Info  Code Status, Allergies Code Status Info: Partial- no CPR, no ACLS medications, no defibrillation or cardioversion Allergies Info: NKA           Current Medications (10/24/2016):  This is the current hospital active medication list Current Facility-Administered Medications  Medication Dose Route Frequency Provider Last Rate Last Dose  . acetaminophen (TYLENOL) tablet 650 mg  650 mg Oral Q4H PRN Irine Seal, MD   650 mg at 10/20/16 0837  . bisacodyl (DULCOLAX) suppository 10 mg  10 mg Rectal Daily PRN Irine Seal, MD      . calcium carbonate (OS-CAL - dosed in mg of elemental calcium) tablet 1,000 mg of elemental calcium  2 tablet Oral TID WC Eugenie Filler, MD   1,000 mg of elemental calcium at 10/24/16 0933  . cholecalciferol (VITAMIN D) tablet 800 Units  800 Units Oral Daily Eugenie Filler, MD   800 Units at 10/24/16 [phone]  . cyanocobalamin ((VITAMIN B-12)) injection 1,000 mcg  1,000 mcg Intramuscular q morning - 10a Eugenie Filler, MD   1,000 mcg at 10/24/16 0934  . enoxaparin (LOVENOX) injection 40 mg  40 mg Subcutaneous Q24H Polly Cobia, RPH   40 mg at 10/23/16 1505  . feeding supplement (ENSURE ENLIVE) (ENSURE ENLIVE)  liquid 237 mL  1 Bottle Oral TID BM Stasia Cavalier, MD   237 mL at 10/24/16 1000  . fluconazole (DIFLUCAN) 40 MG/ML suspension 400 mg  400 mg Oral Daily Stasia Cavalier, MD      . hyoscyamine (LEVSIN SL) SL tablet 0.125 mg  0.125 mg Sublingual Q4H PRN Irine Seal, MD      . latanoprost (XALATAN) 0.005 % ophthalmic solution 1 drop  1 drop Both Eyes QHS Irine Seal, MD   1 drop at 10/23/16 2227  . loperamide (IMODIUM) capsule 2 mg  2 mg Oral PRN Irine Seal, MD      . LORazepam (ATIVAN) tablet 0.5 mg  0.5 mg Oral BID PRN Irine Seal, MD   0.5 mg at 10/22/16 0654  . nystatin (MYCOSTATIN) 100000 UNIT/ML suspension 500,000 Units  5  mL Oral QID Reyne Dumas, MD   500,000 Units at 10/23/16 2225  . ondansetron (ZOFRAN) injection 4 mg  4 mg Intravenous Q4H PRN Irine Seal, MD   4 mg at 10/21/16 1903  . senna-docusate (Senokot-S) tablet 1 tablet  1 tablet Oral QHS PRN Irine Seal, MD      . sodium phosphate (FLEET) 7-19 GM/118ML enema 1 enema  1 enema Rectal Once PRN Irine Seal, MD      . traMADol Veatrice Bourbon) tablet 50-100 mg  50-100 mg Oral Q6H PRN Irine Seal, MD   50 mg at 10/23/16 2221  . zolpidem (AMBIEN) tablet 5 mg  5 mg Oral QHS PRN Irine Seal, MD   5 mg at 10/23/16 2221     Discharge Medications: Please see discharge summary for a list of discharge medications.  Relevant Imaging Results:  Relevant Lab Results:   Additional Information SS#: [id number]  Jorge Ny, LCSW

## 2016-10-25 ENCOUNTER — Other Ambulatory Visit: Payer: Self-pay | Admitting: Urology

## 2016-10-25 LAB — BASIC METABOLIC PANEL
ANION GAP: 6 (ref 5–15)
BUN: 8 mg/dL (ref 6–20)
CALCIUM: 8.1 mg/dL — AB (ref 8.9–10.3)
CO2: 29 mmol/L (ref 22–32)
Chloride: 105 mmol/L (ref 101–111)
Creatinine, Ser: 0.82 mg/dL (ref 0.44–1.00)
Glucose, Bld: 79 mg/dL (ref 65–99)
Potassium: 3.9 mmol/L (ref 3.5–5.1)
Sodium: 140 mmol/L (ref 135–145)

## 2016-10-25 MED ORDER — DRONABINOL 2.5 MG PO CAPS
2.5000 mg | ORAL_CAPSULE | Freq: Two times a day (BID) | ORAL | Status: DC
  Administered 2016-10-25 – 2016-10-27 (×4): 2.5 mg via ORAL
  Filled 2016-10-25 (×4): qty 1

## 2016-10-25 MED ORDER — POLYETHYLENE GLYCOL 3350 17 G PO PACK
17.0000 g | PACK | Freq: Two times a day (BID) | ORAL | Status: DC
  Administered 2016-10-25: 17 g via ORAL
  Filled 2016-10-25: qty 1

## 2016-10-25 MED ORDER — KCL IN DEXTROSE-NACL 20-5-0.45 MEQ/L-%-% IV SOLN
INTRAVENOUS | Status: DC
  Administered 2016-10-25 (×2): via INTRAVENOUS
  Filled 2016-10-25 (×3): qty 1000

## 2016-10-25 MED ORDER — BISACODYL 5 MG PO TBEC
5.0000 mg | DELAYED_RELEASE_TABLET | Freq: Every day | ORAL | Status: DC
  Administered 2016-10-25: 5 mg via ORAL
  Filled 2016-10-25: qty 1

## 2016-10-25 MED ORDER — POLYETHYLENE GLYCOL 3350 17 G PO PACK: 17.0000 g | PACK | Freq: Every day | ORAL | Status: DC | PRN

## 2016-10-25 MED ORDER — BISACODYL 5 MG PO TBEC: 5.0000 mg | DELAYED_RELEASE_TABLET | Freq: Every day | ORAL | Status: DC | PRN

## 2016-10-25 NOTE — Progress Notes (Signed)
6 Days Post-Op  Subjective: Caroline Griffith has a new complaint today of difficulty swallowing and poor po intake and per her son she has not eaten in 5 days.  He reports that her urine is concentrated.  She is otherwise doing well but is not ready for discharge.  ROS:  Review of Systems  Constitutional: Negative for chills and fever.    Anti-infectives: Anti-infectives    Start     Dose/Rate Route Frequency Ordered Stop   10/24/16 1000  fluconazole (DIFLUCAN) 40 MG/ML suspension 400 mg     400 mg Oral Daily 10/24/16 0824     10/24/16 0000  fluconazole (DIFLUCAN) 40 MG/ML suspension     400 mg Oral Daily 10/24/16 0853 11/11/16 2359   10/23/16 1400  fluconazole (DIFLUCAN) tablet 400 mg  Status:  Discontinued     400 mg Oral Daily 10/23/16 0957 10/24/16 0824   10/22/16 1600  fluconazole (DIFLUCAN) IVPB 200 mg  Status:  Discontinued     200 mg 100 mL/hr over 60 Minutes Intravenous Every 24 hours 10/21/16 1458 10/23/16 0957   10/22/16 1400  fluconazole (DIFLUCAN) IVPB 400 mg  Status:  Discontinued     400 mg 100 mL/hr over 120 Minutes Intravenous Every 24 hours 10/21/16 1405 10/21/16 1454   10/21/16 1800  anidulafungin (ERAXIS) 100 mg in sodium chloride 0.9 % 100 mL IVPB  Status:  Discontinued     100 mg 78 mL/hr over 100 Minutes Intravenous Every 24 hours 10/20/16 1719 10/21/16 1405   10/21/16 1600  fluconazole (DIFLUCAN) IVPB 400 mg     400 mg 100 mL/hr over 120 Minutes Intravenous  Once 10/21/16 1458 10/21/16 1846   10/21/16 1415  fluconazole (DIFLUCAN) IVPB 800 mg  Status:  Discontinued     800 mg 200 mL/hr over 120 Minutes Intravenous  Once 10/21/16 1405 10/21/16 1453   10/20/16 1800  anidulafungin (ERAXIS) 200 mg in sodium chloride 0.9 % 200 mL IVPB     200 mg 78 mL/hr over 200 Minutes Intravenous Once 10/20/16 1719 10/20/16 2109   10/20/16 0700  cefTRIAXone (ROCEPHIN) 2 g in dextrose 5 % 50 mL IVPB  Status:  Discontinued     2 g 100 mL/hr over 30 Minutes Intravenous Daily before  breakfast 10/20/16 0656 10/21/16 1414   10/19/16 1600  cephALEXin (KEFLEX) capsule 250 mg  Status:  Discontinued     250 mg Oral 3 times daily 10/19/16 1357 10/20/16 0656   10/19/16 1300  cefTRIAXone (ROCEPHIN) 1 g in dextrose 5 % 50 mL IVPB  Status:  Discontinued     1 g 100 mL/hr over 30 Minutes Intravenous Every 24 hours 10/19/16 1200 10/19/16 1412   10/19/16 0615  ciprofloxacin (CIPRO) IVPB 400 mg     400 mg 200 mL/hr over 60 Minutes Intravenous  Once 10/19/16 0604 10/19/16 0845   10/19/16 0000  cephALEXin (KEFLEX) 500 MG capsule  Status:  Discontinued     500 mg Oral Daily at bedtime 10/19/16 1008 10/19/16    10/19/16 0000  cephALEXin (KEFLEX) 250 MG capsule  Status:  Discontinued    Comments:  Please take 1 at bedtime after the first 5 days.   250 mg Oral 3 times daily 10/19/16 1145 10/24/16       Current Facility-Administered Medications  Medication Dose Route Frequency Provider Last Rate Last Dose  . acetaminophen (TYLENOL) tablet 650 mg  650 mg Oral Q4H PRN Irine Seal, MD   650 mg at 10/20/16 0837  . bisacodyl (  DULCOLAX) suppository 10 mg  10 mg Rectal Daily PRN Irine Seal, MD      . calcium carbonate (OS-CAL - dosed in mg of elemental calcium) tablet 1,000 mg of elemental calcium  2 tablet Oral TID WC Eugenie Filler, MD   1,000 mg of elemental calcium at 10/24/16 1749  . cholecalciferol (VITAMIN D) tablet 800 Units  800 Units Oral Daily Eugenie Filler, MD   800 Units at 10/24/16 [phone]  . cyanocobalamin ((VITAMIN B-12)) injection 1,000 mcg  1,000 mcg Intramuscular q morning - 10a Eugenie Filler, MD   1,000 mcg at 10/24/16 0934  . dextrose 5 % and 0.45 % NaCl with KCl 20 mEq/L infusion   Intravenous Continuous Irine Seal, MD      . enoxaparin (LOVENOX) injection 40 mg  40 mg Subcutaneous Q24H Reuel Boom A, RPH   40 mg at 10/24/16 1310  . feeding supplement (ENSURE ENLIVE) (ENSURE ENLIVE) liquid 237 mL  1 Bottle Oral TID BM Stasia Cavalier, MD   237 mL at  10/24/16 1400  . fluconazole (DIFLUCAN) 40 MG/ML suspension 400 mg  400 mg Oral Daily Stasia Cavalier, MD   400 mg at 10/24/16 1310  . hyoscyamine (LEVSIN SL) SL tablet 0.125 mg  0.125 mg Sublingual Q4H PRN Irine Seal, MD      . latanoprost (XALATAN) 0.005 % ophthalmic solution 1 drop  1 drop Both Eyes QHS Irine Seal, MD   1 drop at 10/24/16 2054  . loperamide (IMODIUM) capsule 2 mg  2 mg Oral PRN Irine Seal, MD      . LORazepam (ATIVAN) tablet 0.5 mg  0.5 mg Oral BID PRN Irine Seal, MD   0.5 mg at 10/22/16 0654  . nystatin (MYCOSTATIN) 100000 UNIT/ML suspension 500,000 Units  5 mL Oral QID Reyne Dumas, MD   500,000 Units at 10/24/16 2054  . ondansetron (ZOFRAN) injection 4 mg  4 mg Intravenous Q4H PRN Irine Seal, MD   4 mg at 10/25/16 9604  . senna-docusate (Senokot-S) tablet 1 tablet  1 tablet Oral QHS PRN Irine Seal, MD      . sodium phosphate (FLEET) 7-19 GM/118ML enema 1 enema  1 enema Rectal Once PRN Irine Seal, MD      . traMADol Veatrice Bourbon) tablet 50-100 mg  50-100 mg Oral Q6H PRN Irine Seal, MD   50 mg at 10/25/16 0606  . zolpidem (AMBIEN) tablet 5 mg  5 mg Oral QHS PRN Irine Seal, MD   5 mg at 10/23/16 2221     Objective: Vital signs in last 24 hours: Temp:  [98.3 F (36.8 C)-99.1 F (37.3 C)] 99.1 F (37.3 C) (10/15 0614) Pulse Rate:  [85-91] 91 (10/15 0441) Resp:  [18] 18 (10/15 0441) BP: (132-147)/(56-72) 145/63 (10/15 0441) SpO2:  [94 %-95 %] 94 % (10/15 0614) Weight:  [73.1 kg (161 lb 2.5 oz)] 73.1 kg (161 lb 2.5 oz) (10/15 0441)  Intake/Output from previous day: 10/14 0701 - 10/15 0700 In: -  Out: 1800 [Urine:1800] Intake/Output this shift: No intake/output data recorded.   Physical Exam  Constitutional: She is well-developed, well-nourished, and in no distress.  Vitals reviewed.   Lab Results:   Recent Labs  10/23/16 0423  WBC 7.3  HGB 10.4*  HCT 31.8*  PLT 247   BMET  Recent Labs  10/23/16 0423 10/24/16 0446  NA 139 140  K 3.0*  3.9  CL 103 105  CO2 28 29  GLUCOSE 84 79  BUN  10 8  CREATININE 0.81 0.82  CALCIUM 7.8* 8.1*   PT/INR No results for input(s): LABPROT, INR in the last 72 hours. ABG No results for input(s): PHART, HCO3 in the last 72 hours.  Invalid input(s): PCO2, PO2  Studies/Results: No results found.   Assessment and Plan: Difficulty swallowing.   I have ordered resumption of IV fluids and have ordered a swallowing study.  I will ask the hospitalists to resume following her and will delay discharge for now.   Right ureteral stone with recent ureteroscopy and stenting complicated by fungemia.   I will try to get her stent out possibly tomorrow.       LOS: 6 days    Malka So 10/25/2016 [phone] ID: Caroline Griffith, female   DOB: [birth year], 83 y.o.   MRN: [id number]

## 2016-10-25 NOTE — Evaluation (Signed)
Clinical/Bedside Swallow Evaluation Patient Details  Name: Caroline Griffith MRN: [id number] Date of Birth: 11-Feb-1930  Today's Date: 10/25/2016 Time: SLP Start Time (ACUTE ONLY): 1010 SLP Stop Time (ACUTE ONLY): 1105 SLP Time Calculation (min) (ACUTE ONLY): 55 min  Past Medical History:  Past Medical History:  Diagnosis Date  . Arthritis   . Chronic kidney disease    kidney stones  . History of hiatal hernia   . Macular degeneration   . Pneumonia    hx. of  . Urolithiasis   . Varicose veins    bilateral LE     Past Surgical History:  Past Surgical History:  Procedure Laterality Date  . ABDOMINAL HYSTERECTOMY    . BUNIONECTOMY Right   . CYSTOSCOPY WITH STENT PLACEMENT Right 08/15/2014   Procedure: CYSTOSCOPY WITH RIGHT STENT PLACEMENT;  Surgeon: [name], MD;  Location: [facility];  Service: Urology;  Laterality: Right;  . CYSTOSCOPY WITH STENT PLACEMENT Right 10/02/2016   Procedure: CYSTOSCOPY, RIGHT RETROGRADE WITH RIGHT URETERAL STENT PLACEMENT;  Surgeon: Irine Seal, MD;  Location: [facility];  Service: Urology;  Laterality: Right;  . CYSTOSCOPY/URETEROSCOPY/HOLMIUM LASER/STENT PLACEMENT Right 10/19/2016   Procedure: CYSTOSCOPY/URETEROSCOPY/HOLMIUM LASER/STENT EXCHANGE;  Surgeon: Irine Seal, MD;  Location: [facility];  Service: Urology;  Laterality: Right;  ONLY NEEDS 60 MINUTES FOR PROCEDURE  . EXTRACORPOREAL SHOCK WAVE LITHOTRIPSY Right 08/18/2014  . JOINT REPLACEMENT Right    Total hip replacement  . JOINT REPLACEMENT Right    Total knee replacement  . ORIF WRIST FRACTURE Left   . TONSILLECTOMY     HPI:  83 yo female with history of kidney stones, macular degeneration, varicose veins, OA, presenting after ureteroscopy per urology.  She was admitted due to fever post op - She was recently discharged on 9/24 for a right UPJ stone with associated hydronephrosis and a urinary tract infection with pansensitive Escherichia coli. She was discharged on Keflex for 7 days and then took  prophylaxis doses nightly until she followed up with urology. On 10/9 she had ureteroscopy with laser and stent removal and replacement. She was admitted due to fever postop and hypotension.     Assessment / Plan / Recommendation Clinical Impression  Pt has known h/o hiatal hernia and decreased esophageal peristalsis per esophagram 06/2005.  Negative CN exam helping to rule out oropharyngeal dysphagia.   Swallow appeared timely with laryngeal elevation appreciated via palpation.  No indication of aspiration or dysphagia during po observed.  Sequential boluses of Ensure did result in pt having mild cough but small boluses tolerated well.  Pt complained of ensure being "too sweet".  Pt did require much encouragement to consume po today.    SLP observed her with water 1 ounce, gingerale 3 ounces, entire bottle of Ensure and single saltine cracker bolus.   She did not report dysphagia symptoms- oropharyngeal nor esophageal.     Pt admits to feeling nauseated during intake and having poor appetite.   Pt was observed to gag when swishing and expectorating with water - son reports this to occur since admit that this could suggest esophageal difficulties.  Pt's son very happy with amount of po pt consumed during evaluation.  Son and pt educated to SLP findings of no signs of oropharyngeal dysphagia and helpful compensation/mitigation strategies.    Hopeful this is an exacerbation of baseline difficulties with extra factors including poor appetite and nausea and anticipate continued improvement.    Recommend continue diet  - encouraging po intake.  If pt's symptoms do  not resolve, she may benefit from repeated esophagram and/or GI referral.    Do not recommend MBS as no indications of oropharygneal dysphagia.    Will follow up briefly to aid in maximizing pt intake- thanks for this referral.  SLP Visit Diagnosis: Dysphagia, unspecified (R13.10)    Aspiration Risk  Mild aspiration risk    Diet  Recommendation Dysphagia 3 (Mech soft);Thin liquid   Liquid Administration via: Cup Medication Administration: Whole meds with puree (start and follow with liquids) Supervision: Patient able to self feed Compensations: Slow rate;Small sips/bites;Minimize environmental distractions (start meals with liquids) Postural Changes: Seated upright at 90 degrees;Remain upright for at least 30 minutes after po intake    Other  Recommendations Oral Care Recommendations: Oral care BID   Follow up Recommendations   n/a     Frequency and Duration min 1 x/week  1 week       Prognosis Prognosis for Safe Diet Advancement: Guarded      Swallow Study   General Date of Onset: 10/25/16 HPI: 83 yo female with history of kidney stones, macular degeneration, varicose veins, OA, presenting after ureteroscopy per urology.  She was admitted due to fever post op - She was recently discharged on 9/24 for a right UPJ stone with associated hydronephrosis and a urinary tract infection with pansensitive Escherichia coli. She was discharged on Keflex for 7 days and then took prophylaxis doses nightly until she followed up with urology. On 10/9 she had ureteroscopy with laser and stent removal and replacement. She was admitted due to fever postop and hypotension.   Type of Study: Bedside Swallow Evaluation Diet Prior to this Study: Dysphagia 3 (soft);Thin liquids Temperature Spikes Noted: No Respiratory Status: Room air History of Recent Intubation: No Behavior/Cognition: Alert;Cooperative;Pleasant mood Oral Cavity Assessment: Within Functional Limits Oral Care Completed by SLP: No Oral Cavity - Dentition: Adequate natural dentition (pt does have sensitive dentition) Vision: Functional for self-feeding Self-Feeding Abilities: Able to feed self Patient Positioning: Upright in bed Baseline Vocal Quality: Low vocal intensity Volitional Cough: Strong Volitional Swallow: Able to elicit    Oral/Motor/Sensory  Function Overall Oral Motor/Sensory Function: Within functional limits   Ice Chips Ice chips: Not tested Other Comments: pt has sensitive teeth   Thin Liquid Thin Liquid: Within functional limits Presentation: Cup    Nectar Thick Nectar Thick Liquid: Within functional limits Presentation: Cup;Self Fed Other Comments: delayed cough and throat clear x1 with sequential boluses of Ensure   Honey Thick Honey Thick Liquid: Not tested   Puree Puree: Not tested Other Comments: pt declined to consume icecream and no applesauce available   Solid   GO   Solid: Within functional limits Presentation: Self Fed Other Comments: pt used Ensure and gingerale to faciliate oral transiting/clearance        Macario Golds 10/25/2016,11:40 AM  Luanna Salk, [place] [facility] SLP [phone]

## 2016-10-25 NOTE — Progress Notes (Addendum)
    [facility]   Reason for visit: Follow up on fungemia  Interval History: Echo done and no evidence of vegetation, repeat cultures ngtd.  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  Stents in place and Dr. [name] plans to remove this week.    Plan: 1.  Continue fluconazole 400 mg po daily for 2 weeks after stent removal. I appreciate Dr. Ena Dawley involvement and negative for endophthalmitis.    Dr. Johnnye Sima on tomorrow.

## 2016-10-25 NOTE — Progress Notes (Addendum)
CONSULT/PROGRESS NOTE    Caroline Griffith  WNU:[id number] DOB: 08-17-1930 DOA: 10/19/2016 PCP: Lavone Orn, MD   Brief Narrative:  Caroline Griffith an 83 y.o.femalewith a history of kidney stones, macular degeneration, varicose veins, OA, presenting after ureteroscopy per urology. She was admitted due to fever post op. TRH consulted due to hypotension. She was recently discharged on 9/24 for a right UPJ stone with associated hydronephrosis and a urinary tract infection with pansensitive Escherichia coli. She was discharged on Keflex for 7 days and then took prophylaxis doses nightly until she followed up with urology. On 10/9 she had ureteroscopy with laser and stent removal and replacement. She was admitted due to fever postop and hypotension.  Subjective:   Patient sitting in bed in no discomfort, brought to my attention by son that she's been having issues with swallowing food, upon my interview she says that she has had issues on and off with dysphagia for the last 3-4 years, however son is insisting that her dysphagia has gotten worse lately, she denies any headache chest pain or abdominal pain, no focal weakness. Does have chronic back pain and bilateral neuropathy related pain.   Assessment & Plan:    #1 right ureteral calculus status post cystoscopy/sepsis secondary to fungemia anemia/hypotension -  Patient status post recent admission to [facility] with 8 mm R UP J calculus. Status post right ureteral stent placed 10/02/2016, with CYSTOSCOPY/URETEROSCOPY/HOLMIUM LASER/STENT EXCHANGE 10/19/2016, treated with appropriate antibiotics and now antibiotics have been stopped. Seen by ID. Has candidemia for which she had ophthalmology evaluation as well. Currently on fluconazole which per ID she has to continue 400 mg oral daily with a stop date 2 weeks from the date of Stent removal. Kindly arrange for outpatient ID follow-up with Dr. Linus Salmons 7-10 days after surgery.  #2 Candida albicans  fungemia  Patient with 2/2 blood cultures positive for Candida albicans the cystoscopy with stent placement 10/19/2016. Patient has been seen in consultation by ID, IV Rocephin discontinued, patient placed on  fluconazole, 2-D echo done showing no evidence of vegetation, repeat blood cultures so far negative, ophthalmology also saw the patient with a negative eye exam. Per ID can be switched to oral fluconazole at the time of discharge with a finish date of 11/10/2016.  #3 Shortness of breath - acute on chronic diastolic CHF EF 03%, resolved after Lasix.   #4 vitamin D deficiency  Patient with vitamin D 25-hydroxy level at 26.1. We'll place on oral vitamin D supplementation with calcium, she currently does not want to take it we can clearly skip it if she wants to for for a few days thereafter continue.  #5 vitamin B 12 deficiency  Continue vitamin B-12 supplementation.  #6 iron deficiency anemia  H&H stable. Patient would likely benefit from oral iron supplementation on discharge.  #7 hypocalcemia  Corrected calcium 8.8 which is borderline. Vitamin D 25-hydroxy level at 26.1. Patient be started on vitamin D supplementation and calcium. Outpatient follow-up with PCP post DC.  #8. Chronic back and bilateral peripheral neuropathy related foot pain. Stable. Supportive care. These are chronic.  #9. Chronic intermittent dysphagia. Rule out any acute worsening, per patient she has had issues with dysphagia off and on for several years, will request speech therapy to evaluate to rule out any acute component previous history consistent with reflux related dysphagia.  #10. Poor appetite. Placed on Marinol. Monitor. Encouraged to increase activity. Placed on bowel movement for some obstipation.    DVT prophylaxis: Lovenox Code  Status: Partial Family Communication: Updated patient and daughter at bedside.also son on 10/25/2016 in detail. Disposition Plan: Per primary team which is  urology.   Consultants:   ID: Dr Linus Salmons: 10/21/2016  [facility]: Dr. Florene Glen 10/20/2016  PCCM: Dr. Belenda Cruise 10/20/2016  Dr. Posey Pronto ophthalmology   Procedures:   CXR 10/20/2016, 10/22/2016  ECHO 10/22/2016 - EF 32%, some diastolic dysfunction, no vegetations.  Antimicrobials:   Oral cephalexin 10/19/2016>>>> 10/20/2016  IV Eraxis 10/20/2016>>>> 10/21/2016  IV Rocephin 10/19/2016 >>>> 10/20/2016  Irises ciprofloxacin 1 dose  IV Diflucan 10/21/2016     Objective: Vitals:   10/24/16 1508 10/24/16 2052 10/25/16 0441 10/25/16 0614  BP: (!) 147/72 (!) 132/56 (!) 145/63   Pulse: 91 85 91   Resp: 18 18 18    Temp: 99 F (37.2 C) 98.3 F (36.8 C)  99.1 F (37.3 C)  TempSrc: Oral Oral Oral Oral  SpO2: 94% 95% 95% 94%  Weight:   73.1 kg (161 lb 2.5 oz)   Height:        Intake/Output Summary (Last 24 hours) at 10/25/16 0947 Last data filed at 10/25/16 0839  Gross per 24 hour  Intake              120 ml  Output             1800 ml  Net            -1680 ml   Filed Weights   10/23/16 0530 10/24/16 0540 10/25/16 0441  Weight: 75.4 kg (166 lb 3.6 oz) 74 kg (163 lb 2.3 oz) 73.1 kg (161 lb 2.5 oz)    Examination:  General exam: Appears calm and comfortable  Respiratory system: Some scattered crackles otherwise clear. No wheezes. No rhonchi. Respiratory effort normal. Cardiovascular system: S1 & S2 heard, RRR. No JVD, murmurs, rubs, gallops or clicks. No pedal edema. Gastrointestinal system: Abdomen is nondistended, soft and nontender. No organomegaly or masses felt. Normal bowel sounds heard. Central nervous system: Alert and oriented. No focal neurological deficits. Extremities: Symmetric 5 x 5 power. Skin: No rashes, lesions or ulcers Psychiatry: Judgement and insight appear normal. Mood & affect appropriate.     Data Reviewed: I have personally reviewed following labs and imaging studies  CBC:  Recent Labs Lab 10/19/16 1225 10/20/16 0317  10/21/16 0334 10/22/16 0430 10/23/16 0423  WBC 2.0* 15.0* 12.2* 10.0 7.3  NEUTROABS 1.8 13.4*  --   --   --   HGB 11.3* 10.6* 10.3* 11.0* 10.4*  HCT 35.4* 33.0* 31.9* 34.1* 31.8*  MCV 94.4 94.0 94.4 93.2 91.6  PLT 337 288 210 222 355   Basic Metabolic Panel:  Recent Labs Lab 10/20/16 0317 10/21/16 0334 10/22/16 0430 10/23/16 0423 10/24/16 0446  NA 138 136 137 139 140  K 4.0 3.7 3.6 3.0* 3.9  CL 105 105 103 103 105  CO2 25 24 24 28 29   GLUCOSE 121* 84 65 84 79  BUN 11 10 10 10 8   CREATININE 1.30* 1.06* 0.92 0.81 0.82  CALCIUM 7.5* 7.5* 7.6* 7.8* 8.1*  MG  --   --   --   --  1.7   GFR: Estimated Creatinine Clearance: 52.8 mL/min (by C-G formula based on SCr of 0.82 mg/dL). Liver Function Tests:  Recent Labs Lab 10/20/16 1330  ALBUMIN 2.3*   No results for input(s): LIPASE, AMYLASE in the last 168 hours. No results for input(s): AMMONIA in the last 168 hours. Coagulation Profile: No results for input(s): INR, PROTIME  in the last 168 hours. Cardiac Enzymes: No results for input(s): CKTOTAL, CKMB, CKMBINDEX, TROPONINI in the last 168 hours. BNP (last 3 results) No results for input(s): PROBNP in the last 8760 hours. HbA1C: No results for input(s): HGBA1C in the last 72 hours. CBG: No results for input(s): GLUCAP in the last 168 hours. Lipid Profile: No results for input(s): CHOL, HDL, LDLCALC, TRIG, CHOLHDL, LDLDIRECT in the last 72 hours. Thyroid Function Tests: No results for input(s): TSH, T4TOTAL, FREET4, T3FREE, THYROIDAB in the last 72 hours. Anemia Panel: No results for input(s): VITAMINB12, FOLATE, FERRITIN, TIBC, IRON, RETICCTPCT in the last 72 hours. Sepsis Labs:  Recent Labs Lab 10/19/16 1225 10/20/16 1123 10/20/16 1407  LATICACIDVEN 1.6 1.6 1.6    Recent Results (from the past 240 hour(s))  Culture, blood (Routine X 2) w Reflex to ID Panel     Status: Abnormal   Collection Time: 10/19/16 12:33 PM  Result Value Ref Range Status   Specimen  Description BLOOD LEFT HAND  Final   Special Requests IN PEDIATRIC BOTTLE Blood Culture adequate volume  Final   Culture  Setup Time   Final    YEAST IN PEDIATRIC BOTTLE CRITICAL VALUE NOTED.  VALUE IS CONSISTENT WITH PREVIOUSLY REPORTED AND CALLED VALUE. Performed at [facility], [place] [address] [place], [place] [address]    Culture CANDIDA ALBICANS (A)  Final   Report Status 10/23/2016 FINAL  Final  Culture, blood (Routine X 2) w Reflex to ID Panel     Status: Abnormal   Collection Time: 10/19/16 12:34 PM  Result Value Ref Range Status   Specimen Description BLOOD RIGHT ARM  Final   Special Requests   Final    BOTTLES DRAWN AEROBIC AND ANAEROBIC Blood Culture adequate volume   Culture  Setup Time   Final    YEAST IN BOTH AEROBIC AND ANAEROBIC BOTTLES CRITICAL RESULT CALLED TO, READ BACK BY AND VERIFIED WITH: A [facility] PHARMD 1903 10/20/16 A BROWNING Performed at [facility], [place] [address], [place], [place] [address]    Culture CANDIDA ALBICANS (A)  Final   Report Status 10/23/2016 FINAL  Final  Blood Culture ID Panel (Reflexed)     Status: Abnormal   Collection Time: 10/19/16 12:34 PM  Result Value Ref Range Status   Enterococcus species NOT DETECTED NOT DETECTED Final   Listeria monocytogenes NOT DETECTED NOT DETECTED Final   Staphylococcus species NOT DETECTED NOT DETECTED Final   Staphylococcus aureus NOT DETECTED NOT DETECTED Final   Streptococcus species NOT DETECTED NOT DETECTED Final   Streptococcus agalactiae NOT DETECTED NOT DETECTED Final   Streptococcus pneumoniae NOT DETECTED NOT DETECTED Final   Streptococcus pyogenes NOT DETECTED NOT DETECTED Final   Acinetobacter baumannii NOT DETECTED NOT DETECTED Final   Enterobacteriaceae species NOT DETECTED NOT DETECTED Final   Enterobacter cloacae complex NOT DETECTED NOT DETECTED Final   Escherichia coli NOT DETECTED NOT DETECTED Final   Klebsiella oxytoca NOT DETECTED NOT DETECTED Final   Klebsiella  pneumoniae NOT DETECTED NOT DETECTED Final   Proteus species NOT DETECTED NOT DETECTED Final   Serratia marcescens NOT DETECTED NOT DETECTED Final   Haemophilus influenzae NOT DETECTED NOT DETECTED Final   Neisseria meningitidis NOT DETECTED NOT DETECTED Final   Pseudomonas aeruginosa NOT DETECTED NOT DETECTED Final   Candida albicans DETECTED (A) NOT DETECTED Final    Comment: CRITICAL RESULT CALLED TO, READ BACK BY AND VERIFIED WITH: A [facility] PHARMD 1903 10/20/16 A BROWNING  Candida glabrata NOT DETECTED NOT DETECTED Final   Candida krusei NOT DETECTED NOT DETECTED Final   Candida parapsilosis NOT DETECTED NOT DETECTED Final   Candida tropicalis NOT DETECTED NOT DETECTED Final    Comment: Performed at [facility], [place] [address] [place], [place] [address]  MRSA PCR Screening     Status: None   Collection Time: 10/19/16  1:56 PM  Result Value Ref Range Status   MRSA by PCR NEGATIVE NEGATIVE Final    Comment:        The GeneXpert MRSA Assay (FDA approved for NASAL specimens only), is one component of a comprehensive MRSA colonization surveillance program. It is not intended to diagnose MRSA infection nor to guide or monitor treatment for MRSA infections.   Culture, Urine     Status: Abnormal   Collection Time: 10/20/16  2:05 PM  Result Value Ref Range Status   Specimen Description URINE, CLEAN CATCH  Final   Special Requests NONE  Final   Culture MULTIPLE SPECIES PRESENT, SUGGEST RECOLLECTION (A)  Final   Report Status 10/21/2016 FINAL  Final  Culture, blood (routine x 2)     Status: None (Preliminary result)   Collection Time: 10/21/16 12:29 PM  Result Value Ref Range Status   Specimen Description BLOOD RIGHT ARM  Final   Special Requests   Final    BOTTLES DRAWN AEROBIC AND ANAEROBIC Blood Culture adequate volume   Culture   Final    NO GROWTH 3 DAYS Performed at [facility], [address], [place], [place] [address]    Report Status PENDING   Incomplete  Culture, blood (routine x 2)     Status: None (Preliminary result)   Collection Time: 10/21/16 12:29 PM  Result Value Ref Range Status   Specimen Description BLOOD RIGHT HAND  Final   Special Requests IN PEDIATRIC BOTTLE Blood Culture adequate volume  Final   Culture  Setup Time   Final    YEAST WITH PSEUDOHYPHAE IN PEDIATRIC BOTTLE CRITICAL RESULT CALLED TO, READ BACK BY AND VERIFIED WITH: J LEGGE,PHARMD AT 1033 10/24/16 BY L BENFIELD Performed at [facility], [place] [address] [place], [place] [address]    Culture YEAST  Final   Report Status PENDING  Incomplete         Radiology Studies: No results found.      Scheduled Meds: . calcium carbonate  2 tablet Oral TID WC  . cholecalciferol  800 Units Oral Daily  . cyanocobalamin  1,000 mcg Intramuscular q morning - 10a  . enoxaparin (LOVENOX) injection  40 mg Subcutaneous Q24H  . feeding supplement (ENSURE ENLIVE)  1 Bottle Oral TID BM  . fluconazole  400 mg Oral Daily  . latanoprost  1 drop Both Eyes QHS  . nystatin  5 mL Oral QID   Continuous Infusions: . dextrose 5 % and 0.45 % NaCl with KCl 20 mEq/L 75 mL/hr at 10/25/16 0839     LOS: 6 days    Time spent: 35 minutes   Signature  Lala Lund M.D on 10/25/2016 at 9:47 AM  Between 7am to 7pm - Pager - [phone] ( page via [email or web address], text pages only, please mention full 10 digit call back number).  After 7pm go to [email or web address] - password [facility]

## 2016-10-26 ENCOUNTER — Encounter (HOSPITAL_COMMUNITY): Payer: Self-pay | Admitting: Anesthesiology

## 2016-10-26 ENCOUNTER — Inpatient Hospital Stay (HOSPITAL_COMMUNITY): Payer: PPO | Admitting: Anesthesiology

## 2016-10-26 ENCOUNTER — Encounter (HOSPITAL_COMMUNITY)
Admission: AD | Disposition: A | Discharge status: Skilled Nursing Facility | Payer: Self-pay | Source: Ambulatory Visit | Attending: Urology

## 2016-10-26 DIAGNOSIS — N200 Calculus of kidney: Secondary | ICD-10-CM

## 2016-10-26 HISTORY — PX: CYSTOSCOPY W/ URETERAL STENT REMOVAL: SHX1430

## 2016-10-26 LAB — CBC
HEMATOCRIT: 32.6 % — AB (ref 36.0–46.0)
HEMOGLOBIN: 10.6 g/dL — AB (ref 12.0–15.0)
MCH: 29.9 pg (ref 26.0–34.0)
MCHC: 32.5 g/dL (ref 30.0–36.0)
MCV: 91.8 fL (ref 78.0–100.0)
Platelets: 321 10*3/uL (ref 150–400)
RBC: 3.55 MIL/uL — AB (ref 3.87–5.11)
RDW: 13.8 % (ref 11.5–15.5)
WBC: 8.6 10*3/uL (ref 4.0–10.5)

## 2016-10-26 LAB — BASIC METABOLIC PANEL
Anion gap: 7 (ref 5–15)
BUN: 10 mg/dL (ref 6–20)
CHLORIDE: 100 mmol/L — AB (ref 101–111)
CO2: 29 mmol/L (ref 22–32)
Calcium: 8.1 mg/dL — ABNORMAL LOW (ref 8.9–10.3)
Creatinine, Ser: 0.88 mg/dL (ref 0.44–1.00)
GFR calc Af Amer: >60 mL/min (ref >60)
GFR calc non Af Amer: 59 mL/min — ABNORMAL LOW (ref >60)
GLUCOSE: 118 mg/dL — AB (ref 65–99)
POTASSIUM: 4.1 mmol/L (ref 3.5–5.1)
SODIUM: 136 mmol/L (ref 135–145)

## 2016-10-26 LAB — CULTURE, BLOOD (ROUTINE X 2)
Culture: NO GROWTH
SPECIAL REQUESTS: ADEQUATE
SPECIAL REQUESTS: ADEQUATE

## 2016-10-26 SURGERY — REMOVAL, STENT, URETER, CYSTOSCOPIC
Anesthesia: Monitor Anesthesia Care | Laterality: Right

## 2016-10-26 MED ORDER — PROPOFOL 10 MG/ML IV BOLUS
INTRAVENOUS | Status: AC
  Filled 2016-10-26: qty 40

## 2016-10-26 MED ORDER — SODIUM CHLORIDE 0.9% FLUSH
3.0000 mL | Freq: Two times a day (BID) | INTRAVENOUS | Status: DC
  Administered 2016-10-27: 3 mL via INTRAVENOUS

## 2016-10-26 MED ORDER — PROPOFOL 500 MG/50ML IV EMUL
INTRAVENOUS | Status: DC | PRN
  Administered 2016-10-26: 50 ug/kg/min via INTRAVENOUS

## 2016-10-26 MED ORDER — ACETAMINOPHEN 650 MG RE SUPP
650.0000 mg | RECTAL | Status: DC | PRN
Start: 2016-10-26 — End: 2016-10-27

## 2016-10-26 MED ORDER — FENTANYL CITRATE (PF) 100 MCG/2ML IJ SOLN
INTRAMUSCULAR | Status: AC
  Filled 2016-10-26: qty 2

## 2016-10-26 MED ORDER — LIDOCAINE 2% (20 MG/ML) 5 ML SYRINGE
INTRAMUSCULAR | Status: AC
  Filled 2016-10-26: qty 5

## 2016-10-26 MED ORDER — MEPERIDINE HCL 50 MG/ML IJ SOLN: 6.2500 mg | INTRAMUSCULAR | Status: DC | PRN

## 2016-10-26 MED ORDER — LIDOCAINE 2% (20 MG/ML) 5 ML SYRINGE
INTRAMUSCULAR | Status: DC | PRN
  Administered 2016-10-26: 50 mg via INTRAVENOUS

## 2016-10-26 MED ORDER — ACETAMINOPHEN 325 MG PO TABS: 650.0000 mg | ORAL_TABLET | ORAL | Status: DC | PRN

## 2016-10-26 MED ORDER — LACTATED RINGERS IV SOLN
INTRAVENOUS | Status: DC | PRN
  Administered 2016-10-26: 11:00:00 via INTRAVENOUS

## 2016-10-26 MED ORDER — MIRTAZAPINE 15 MG PO TBDP
15.0000 mg | ORAL_TABLET | Freq: Every day | ORAL | Status: DC
  Administered 2016-10-26: 15 mg via ORAL
  Filled 2016-10-26: qty 1

## 2016-10-26 MED ORDER — STERILE WATER FOR IRRIGATION IR SOLN
Status: DC | PRN
  Administered 2016-10-26: 3000 mL

## 2016-10-26 MED ORDER — FENTANYL CITRATE (PF) 100 MCG/2ML IJ SOLN
INTRAMUSCULAR | Status: DC | PRN
  Administered 2016-10-26: 25 ug via INTRAVENOUS

## 2016-10-26 MED ORDER — FENTANYL CITRATE (PF) 100 MCG/2ML IJ SOLN: 25.0000 ug | INTRAMUSCULAR | Status: DC | PRN

## 2016-10-26 MED ORDER — SODIUM CHLORIDE 0.9 % IV SOLN: 250.0000 mL | INTRAVENOUS | Status: DC | PRN

## 2016-10-26 MED ORDER — SODIUM CHLORIDE 0.9% FLUSH: 3.0000 mL | INTRAVENOUS | Status: DC | PRN

## 2016-10-26 SURGICAL SUPPLY — 12 items
BAG URO CATCHER STRL LF (MISCELLANEOUS) ×3 IMPLANT
CATH URET 5FR 28IN OPEN ENDED (CATHETERS) IMPLANT
CLOTH BEACON ORANGE TIMEOUT ST (SAFETY) ×3 IMPLANT
COVER FOOTSWITCH UNIV (MISCELLANEOUS) ×2 IMPLANT
COVER SURGICAL LIGHT HANDLE (MISCELLANEOUS) ×3 IMPLANT
GLOVE SURG SS PI 8.0 STRL IVOR (GLOVE) ×2 IMPLANT
GOWN STRL REUS W/TWL XL LVL3 (GOWN DISPOSABLE) ×3 IMPLANT
GUIDEWIRE STR DUAL SENSOR (WIRE) ×3 IMPLANT
MANIFOLD NEPTUNE II (INSTRUMENTS) ×3 IMPLANT
PACK CYSTO (CUSTOM PROCEDURE TRAY) ×3 IMPLANT
TUBING CONNECTING 10 (TUBING) ×2 IMPLANT
TUBING CONNECTING 10' (TUBING) ×1

## 2016-10-26 NOTE — Consult Note (Addendum)
   [facility] CM Inpatient Consult   10/26/2016  Caroline Griffith 02/17/1930 [id number]    Spoke with [name] and son at bedside to discuss potential [facility] services.   Both patient and son endorse [name] will be going to SNF at discharge.   Provided 24-hr nurse line magnet, [facility] Care Management brochure, and contact information to contact if needed in the future.   Made inpatient RNCM aware of bedside encounter.   Marthenia Rolling, MSN-Ed, RN,BSN [facility] Liaison [phone]

## 2016-10-26 NOTE — H&P (View-Only) (Signed)
6 Days Post-Op  Subjective: Caroline Griffith has a new complaint today of difficulty swallowing and poor po intake and per her son she has not eaten in 5 days.  He reports that her urine is concentrated.  She is otherwise doing well but is not ready for discharge.  ROS:  Review of Systems  Constitutional: Negative for chills and fever.    Anti-infectives: Anti-infectives    Start     Dose/Rate Route Frequency Ordered Stop   10/24/16 1000  fluconazole (DIFLUCAN) 40 MG/ML suspension 400 mg     400 mg Oral Daily 10/24/16 0824     10/24/16 0000  fluconazole (DIFLUCAN) 40 MG/ML suspension     400 mg Oral Daily 10/24/16 0853 11/11/16 2359   10/23/16 1400  fluconazole (DIFLUCAN) tablet 400 mg  Status:  Discontinued     400 mg Oral Daily 10/23/16 0957 10/24/16 0824   10/22/16 1600  fluconazole (DIFLUCAN) IVPB 200 mg  Status:  Discontinued     200 mg 100 mL/hr over 60 Minutes Intravenous Every 24 hours 10/21/16 1458 10/23/16 0957   10/22/16 1400  fluconazole (DIFLUCAN) IVPB 400 mg  Status:  Discontinued     400 mg 100 mL/hr over 120 Minutes Intravenous Every 24 hours 10/21/16 1405 10/21/16 1454   10/21/16 1800  anidulafungin (ERAXIS) 100 mg in sodium chloride 0.9 % 100 mL IVPB  Status:  Discontinued     100 mg 78 mL/hr over 100 Minutes Intravenous Every 24 hours 10/20/16 1719 10/21/16 1405   10/21/16 1600  fluconazole (DIFLUCAN) IVPB 400 mg     400 mg 100 mL/hr over 120 Minutes Intravenous  Once 10/21/16 1458 10/21/16 1846   10/21/16 1415  fluconazole (DIFLUCAN) IVPB 800 mg  Status:  Discontinued     800 mg 200 mL/hr over 120 Minutes Intravenous  Once 10/21/16 1405 10/21/16 1453   10/20/16 1800  anidulafungin (ERAXIS) 200 mg in sodium chloride 0.9 % 200 mL IVPB     200 mg 78 mL/hr over 200 Minutes Intravenous Once 10/20/16 1719 10/20/16 2109   10/20/16 0700  cefTRIAXone (ROCEPHIN) 2 g in dextrose 5 % 50 mL IVPB  Status:  Discontinued     2 g 100 mL/hr over 30 Minutes Intravenous Daily before  breakfast 10/20/16 0656 10/21/16 1414   10/19/16 1600  cephALEXin (KEFLEX) capsule 250 mg  Status:  Discontinued     250 mg Oral 3 times daily 10/19/16 1357 10/20/16 0656   10/19/16 1300  cefTRIAXone (ROCEPHIN) 1 g in dextrose 5 % 50 mL IVPB  Status:  Discontinued     1 g 100 mL/hr over 30 Minutes Intravenous Every 24 hours 10/19/16 1200 10/19/16 1412   10/19/16 0615  ciprofloxacin (CIPRO) IVPB 400 mg     400 mg 200 mL/hr over 60 Minutes Intravenous  Once 10/19/16 0604 10/19/16 0845   10/19/16 0000  cephALEXin (KEFLEX) 500 MG capsule  Status:  Discontinued     500 mg Oral Daily at bedtime 10/19/16 1008 10/19/16    10/19/16 0000  cephALEXin (KEFLEX) 250 MG capsule  Status:  Discontinued    Comments:  Please take 1 at bedtime after the first 5 days.   250 mg Oral 3 times daily 10/19/16 1145 10/24/16       Current Facility-Administered Medications  Medication Dose Route Frequency Provider Last Rate Last Dose  . acetaminophen (TYLENOL) tablet 650 mg  650 mg Oral Q4H PRN Irine Seal, MD   650 mg at 10/20/16 0837  . bisacodyl (  DULCOLAX) suppository 10 mg  10 mg Rectal Daily PRN Irine Seal, MD      . calcium carbonate (OS-CAL - dosed in mg of elemental calcium) tablet 1,000 mg of elemental calcium  2 tablet Oral TID WC Eugenie Filler, MD   1,000 mg of elemental calcium at 10/24/16 1749  . cholecalciferol (VITAMIN D) tablet 800 Units  800 Units Oral Daily Eugenie Filler, MD   800 Units at 10/24/16 [phone]  . cyanocobalamin ((VITAMIN B-12)) injection 1,000 mcg  1,000 mcg Intramuscular q morning - 10a Eugenie Filler, MD   1,000 mcg at 10/24/16 0934  . dextrose 5 % and 0.45 % NaCl with KCl 20 mEq/L infusion   Intravenous Continuous Irine Seal, MD      . enoxaparin (LOVENOX) injection 40 mg  40 mg Subcutaneous Q24H Reuel Boom A, RPH   40 mg at 10/24/16 1310  . feeding supplement (ENSURE ENLIVE) (ENSURE ENLIVE) liquid 237 mL  1 Bottle Oral TID BM Stasia Cavalier, MD   237 mL at  10/24/16 1400  . fluconazole (DIFLUCAN) 40 MG/ML suspension 400 mg  400 mg Oral Daily Stasia Cavalier, MD   400 mg at 10/24/16 1310  . hyoscyamine (LEVSIN SL) SL tablet 0.125 mg  0.125 mg Sublingual Q4H PRN Irine Seal, MD      . latanoprost (XALATAN) 0.005 % ophthalmic solution 1 drop  1 drop Both Eyes QHS Irine Seal, MD   1 drop at 10/24/16 2054  . loperamide (IMODIUM) capsule 2 mg  2 mg Oral PRN Irine Seal, MD      . LORazepam (ATIVAN) tablet 0.5 mg  0.5 mg Oral BID PRN Irine Seal, MD   0.5 mg at 10/22/16 0654  . nystatin (MYCOSTATIN) 100000 UNIT/ML suspension 500,000 Units  5 mL Oral QID Reyne Dumas, MD   500,000 Units at 10/24/16 2054  . ondansetron (ZOFRAN) injection 4 mg  4 mg Intravenous Q4H PRN Irine Seal, MD   4 mg at 10/25/16 5397  . senna-docusate (Senokot-S) tablet 1 tablet  1 tablet Oral QHS PRN Irine Seal, MD      . sodium phosphate (FLEET) 7-19 GM/118ML enema 1 enema  1 enema Rectal Once PRN Irine Seal, MD      . traMADol Veatrice Bourbon) tablet 50-100 mg  50-100 mg Oral Q6H PRN Irine Seal, MD   50 mg at 10/25/16 0606  . zolpidem (AMBIEN) tablet 5 mg  5 mg Oral QHS PRN Irine Seal, MD   5 mg at 10/23/16 2221     Objective: Vital signs in last 24 hours: Temp:  [98.3 F (36.8 C)-99.1 F (37.3 C)] 99.1 F (37.3 C) (10/15 0614) Pulse Rate:  [85-91] 91 (10/15 0441) Resp:  [18] 18 (10/15 0441) BP: (132-147)/(56-72) 145/63 (10/15 0441) SpO2:  [94 %-95 %] 94 % (10/15 0614) Weight:  [73.1 kg (161 lb 2.5 oz)] 73.1 kg (161 lb 2.5 oz) (10/15 0441)  Intake/Output from previous day: 10/14 0701 - 10/15 0700 In: -  Out: 1800 [Urine:1800] Intake/Output this shift: No intake/output data recorded.   Physical Exam  Constitutional: She is well-developed, well-nourished, and in no distress.  Vitals reviewed.   Lab Results:   Recent Labs  10/23/16 0423  WBC 7.3  HGB 10.4*  HCT 31.8*  PLT 247   BMET  Recent Labs  10/23/16 0423 10/24/16 0446  NA 139 140  K 3.0*  3.9  CL 103 105  CO2 28 29  GLUCOSE 84 79  BUN  10 8  CREATININE 0.81 0.82  CALCIUM 7.8* 8.1*   PT/INR No results for input(s): LABPROT, INR in the last 72 hours. ABG No results for input(s): PHART, HCO3 in the last 72 hours.  Invalid input(s): PCO2, PO2  Studies/Results: No results found.   Assessment and Plan: Difficulty swallowing.   I have ordered resumption of IV fluids and have ordered a swallowing study.  I will ask the hospitalists to resume following her and will delay discharge for now.   Right ureteral stone with recent ureteroscopy and stenting complicated by fungemia.   I will try to get her stent out possibly tomorrow.       LOS: 6 days    Caroline Griffith 10/25/2016 [phone] ID: Velna Hatchet, female   DOB: [birth year], 83 y.o.   MRN: [id number]

## 2016-10-26 NOTE — Anesthesia Postprocedure Evaluation (Signed)
Anesthesia Post Note  Patient: Caroline Griffith  Procedure(s) Performed: CYSTOSCOPY WITH STENT REMOVAL (Right )     Patient location during evaluation: PACU Anesthesia Type: MAC Level of consciousness: awake and alert Pain management: pain level controlled Vital Signs Assessment: post-procedure vital signs reviewed and stable Respiratory status: spontaneous breathing, nonlabored ventilation, respiratory function stable and patient connected to nasal cannula oxygen Cardiovascular status: stable and blood pressure returned to baseline Postop Assessment: no apparent nausea or vomiting Anesthetic complications: no    Last Vitals:  Vitals:   10/26/16 1349 10/26/16 1445  BP: 126/76 119/65  Pulse: 87 91  Resp: 16 16  Temp: 37 C 36.9 C  SpO2: 98% 95%    Last Pain:  Vitals:   10/26/16 1445  TempSrc: Oral  PainSc:                  Shneur [place]

## 2016-10-26 NOTE — Progress Notes (Signed)
Physical Therapy Treatment Patient Details Name: Caroline Griffith MRN: [id number] DOB: 05-07-1930 Today's Date: 10/26/2016    History of Present Illness 83 yo female s/p cystoscopy, R double J stent placement 10/19/16, weakness. Prior stent placement 10/02/16. Hx of CKD, arthritis macular degeneration.  For stent removal 10/26/16    PT Comments    Pt assisted to sitting EOB and linen and gown saturated with urine.  Pt unable to stand with one person assist.  RN and NT into room to change linen as pt to go for stent removal.  Son assisted therapist with sit to stand transfer.  Continue to recommend SNF upon d/c.  Son reports pt has not eaten in 5 days except for a couple bites of pancakes and bacon last night.   Follow Up Recommendations  SNF     Equipment Recommendations  None recommended by PT    Recommendations for Other Services       Precautions / Restrictions Precautions Precautions: Fall Restrictions Weight Bearing Restrictions: No    Mobility  Bed Mobility Overal bed mobility: Needs Assistance Bed Mobility: Supine to Sit;Sit to Supine     Supine to sit: Max assist;+2 for safety/equipment;HOB elevated Sit to supine: +2 for physical assistance;Total assist   General bed mobility comments: assist for upper and lower body, pt appears much weaker since previous session  Transfers Overall transfer level: Needs assistance Equipment used: Rolling walker (2 wheeled) Transfers: Sit to/from Stand Sit to Stand: Mod assist         General transfer comment: Assist to rise, stabilize, control descent. VCs safety, technique, hand placement. Increased time. pt unable to stand with only one person assist, required two people, unable to march in place to take steps  Ambulation/Gait                 Stairs            Wheelchair Mobility    Modified Rankin (Stroke Patients Only)       Balance Overall balance assessment: Needs assistance         Standing  balance support: Bilateral upper extremity supported Standing balance-Leahy Scale: Poor                              Cognition Arousal/Alertness: Awake/alert Behavior During Therapy: WFL for tasks assessed/performed Overall Cognitive Status: Within Functional Limits for tasks assessed                                        Exercises      General Comments        Pertinent Vitals/Pain Pain Assessment: Faces Faces Pain Scale: Hurts little more Pain Location: "sore all over" Pain Descriptors / Indicators: Sore;Grimacing Pain Intervention(s): Limited activity within patient's tolerance;Repositioned;Monitored during session    Home Living                      Prior Function            PT Goals (current goals can now be found in the care plan section) Progress towards PT goals: Progressing toward goals    Frequency    Min 3X/week      PT Plan Current plan remains appropriate    Co-evaluation              AM-PAC PT "  6 Clicks" Daily Activity  Outcome Measure  Difficulty turning over in bed (including adjusting bedclothes, sheets and blankets)?: Unable Difficulty moving from lying on back to sitting on the side of the bed? : Unable Difficulty sitting down on and standing up from a chair with arms (e.g., wheelchair, bedside commode, etc,.)?: Unable Help needed moving to and from a bed to chair (including a wheelchair)?: Total Help needed walking in hospital room?: Total Help needed climbing 3-5 steps with a railing? : Total 6 Click Score: 6    End of Session Equipment Utilized During Treatment: Gait belt Activity Tolerance: Patient limited by fatigue Patient left: in bed;with call bell/phone within reach;with family/visitor present;with nursing/sitter in room Nurse Communication: Mobility status PT Visit Diagnosis: Muscle weakness (generalized) (M62.81);Difficulty in walking, not elsewhere classified (R26.2)     Time:  6222-9798 PT Time Calculation (min) (ACUTE ONLY): 28 min  Charges:  $Therapeutic Activity: 8-22 mins                    G Codes:       Carmelia Bake, PT, DPT 10/26/2016 Pager: [phone]  York Ram E 10/26/2016, 1:28 PM

## 2016-10-26 NOTE — Progress Notes (Signed)
INFECTIOUS DISEASE PROGRESS NOTE  ID: Caroline Griffith is a 83 y.o. female with  Principal Problem:   Right ureteral calculus Active Problems:   H/O cystoscopy   Leukocytosis   Hypotension   Anemia   Hypocalcemia   Vitamin D deficiency   Sepsis due to Candida (HCC)   Fungemia: Candida  Subjective: No complaints.  Per son, some difficulty with swallowing larger pills.  She denies dysuria or abd pain.   Abtx:  Anti-infectives    Start     Dose/Rate Route Frequency Ordered Stop   10/24/16 1000  fluconazole (DIFLUCAN) 40 MG/ML suspension 400 mg     400 mg Oral Daily 10/24/16 0824     10/24/16 0000  fluconazole (DIFLUCAN) 40 MG/ML suspension     400 mg Oral Daily 10/24/16 0853 11/11/16 2359   10/23/16 1400  fluconazole (DIFLUCAN) tablet 400 mg  Status:  Discontinued     400 mg Oral Daily 10/23/16 0957 10/24/16 0824   10/22/16 1600  fluconazole (DIFLUCAN) IVPB 200 mg  Status:  Discontinued     200 mg 100 mL/hr over 60 Minutes Intravenous Every 24 hours 10/21/16 1458 10/23/16 0957   10/22/16 1400  fluconazole (DIFLUCAN) IVPB 400 mg  Status:  Discontinued     400 mg 100 mL/hr over 120 Minutes Intravenous Every 24 hours 10/21/16 1405 10/21/16 1454   10/21/16 1800  anidulafungin (ERAXIS) 100 mg in sodium chloride 0.9 % 100 mL IVPB  Status:  Discontinued     100 mg 78 mL/hr over 100 Minutes Intravenous Every 24 hours 10/20/16 1719 10/21/16 1405   10/21/16 1600  fluconazole (DIFLUCAN) IVPB 400 mg     400 mg 100 mL/hr over 120 Minutes Intravenous  Once 10/21/16 1458 10/21/16 1846   10/21/16 1415  fluconazole (DIFLUCAN) IVPB 800 mg  Status:  Discontinued     800 mg 200 mL/hr over 120 Minutes Intravenous  Once 10/21/16 1405 10/21/16 1453   10/20/16 1800  anidulafungin (ERAXIS) 200 mg in sodium chloride 0.9 % 200 mL IVPB     200 mg 78 mL/hr over 200 Minutes Intravenous Once 10/20/16 1719 10/20/16 2109   10/20/16 0700  cefTRIAXone (ROCEPHIN) 2 g in dextrose 5 % 50 mL IVPB  Status:   Discontinued     2 g 100 mL/hr over 30 Minutes Intravenous Daily before breakfast 10/20/16 0656 10/21/16 1414   10/19/16 1600  cephALEXin (KEFLEX) capsule 250 mg  Status:  Discontinued     250 mg Oral 3 times daily 10/19/16 1357 10/20/16 0656   10/19/16 1300  cefTRIAXone (ROCEPHIN) 1 g in dextrose 5 % 50 mL IVPB  Status:  Discontinued     1 g 100 mL/hr over 30 Minutes Intravenous Every 24 hours 10/19/16 1200 10/19/16 1412   10/19/16 0615  ciprofloxacin (CIPRO) IVPB 400 mg     400 mg 200 mL/hr over 60 Minutes Intravenous  Once 10/19/16 0604 10/19/16 0845   10/19/16 0000  cephALEXin (KEFLEX) 500 MG capsule  Status:  Discontinued     500 mg Oral Daily at bedtime 10/19/16 1008 10/19/16    10/19/16 0000  cephALEXin (KEFLEX) 250 MG capsule  Status:  Discontinued    Comments:  Please take 1 at bedtime after the first 5 days.   250 mg Oral 3 times daily 10/19/16 1145 10/24/16       Medications:  Scheduled: . calcium carbonate  2 tablet Oral TID WC  . cholecalciferol  800 Units Oral Daily  . cyanocobalamin  1,000 mcg Intramuscular q morning - 10a  . dronabinol  2.5 mg Oral BID AC  . enoxaparin (LOVENOX) injection  40 mg Subcutaneous Q24H  . feeding supplement (ENSURE ENLIVE)  1 Bottle Oral TID BM  . fluconazole  400 mg Oral Daily  . latanoprost  1 drop Both Eyes QHS  . mirtazapine  15 mg Oral QHS  . nystatin  5 mL Oral QID  . sodium chloride flush  3 mL Intravenous Q12H    Objective: Vital signs in last 24 hours: Temp:  [98.2 F (36.8 C)-98.6 F (37 C)] 98.6 F (37 C) (10/16 1349) Pulse Rate:  [83-92] 87 (10/16 1349) Resp:  [16-24] 16 (10/16 1349) BP: (108-134)/(43-76) 126/76 (10/16 1349) SpO2:  [90 %-100 %] 98 % (10/16 1349) Weight:  [74 kg (163 lb 2.3 oz)] 74 kg (163 lb 2.3 oz) (10/16 0437)   General appearance: alert, cooperative, fatigued and no distress Resp: clear to auscultation bilaterally Cardio: regular rate and rhythm GI: normal findings: bowel sounds normal and  soft, non-tender Extremities: edema none  Lab Results  Recent Labs  10/24/16 0446 10/26/16 0436  WBC  --  8.6  HGB  --  10.6*  HCT  --  32.6*  NA 140 136  K 3.9 4.1  CL 105 100*  CO2 29 29  BUN 8 10  CREATININE 0.82 0.88   Liver Panel No results for input(s): PROT, ALBUMIN, AST, ALT, ALKPHOS, BILITOT, BILIDIR, IBILI in the last 72 hours. Sedimentation Rate No results for input(s): ESRSEDRATE in the last 72 hours. C-Reactive Protein No results for input(s): CRP in the last 72 hours.  Microbiology: Recent Results (from the past 240 hour(s))  Culture, blood (Routine X 2) w Reflex to ID Panel     Status: Abnormal   Collection Time: 10/19/16 12:33 PM  Result Value Ref Range Status   Specimen Description BLOOD LEFT HAND  Final   Special Requests IN PEDIATRIC BOTTLE Blood Culture adequate volume  Final   Culture  Setup Time   Final    YEAST IN PEDIATRIC BOTTLE CRITICAL VALUE NOTED.  VALUE IS CONSISTENT WITH PREVIOUSLY REPORTED AND CALLED VALUE. Performed at [facility], [place] [address], [place], [place] [address]    Culture CANDIDA ALBICANS (A)  Final   Report Status 10/23/2016 FINAL  Final  Culture, blood (Routine X 2) w Reflex to ID Panel     Status: Abnormal   Collection Time: 10/19/16 12:34 PM  Result Value Ref Range Status   Specimen Description BLOOD RIGHT ARM  Final   Special Requests   Final    BOTTLES DRAWN AEROBIC AND ANAEROBIC Blood Culture adequate volume   Culture  Setup Time   Final    YEAST IN BOTH AEROBIC AND ANAEROBIC BOTTLES CRITICAL RESULT CALLED TO, READ BACK BY AND VERIFIED WITH: A [facility] PHARMD 1903 10/20/16 A BROWNING Performed at [facility], [place] [address] [place], [place] [address]    Culture CANDIDA ALBICANS (A)  Final   Report Status 10/23/2016 FINAL  Final  Blood Culture ID Panel (Reflexed)     Status: Abnormal   Collection Time: 10/19/16 12:34 PM  Result Value Ref Range Status   Enterococcus species NOT DETECTED NOT DETECTED  Final   Listeria monocytogenes NOT DETECTED NOT DETECTED Final   Staphylococcus species NOT DETECTED NOT DETECTED Final   Staphylococcus aureus NOT DETECTED NOT DETECTED Final   Streptococcus species NOT DETECTED NOT DETECTED Final   Streptococcus agalactiae NOT  DETECTED NOT DETECTED Final   Streptococcus pneumoniae NOT DETECTED NOT DETECTED Final   Streptococcus pyogenes NOT DETECTED NOT DETECTED Final   Acinetobacter baumannii NOT DETECTED NOT DETECTED Final   Enterobacteriaceae species NOT DETECTED NOT DETECTED Final   Enterobacter cloacae complex NOT DETECTED NOT DETECTED Final   Escherichia coli NOT DETECTED NOT DETECTED Final   Klebsiella oxytoca NOT DETECTED NOT DETECTED Final   Klebsiella pneumoniae NOT DETECTED NOT DETECTED Final   Proteus species NOT DETECTED NOT DETECTED Final   Serratia marcescens NOT DETECTED NOT DETECTED Final   Haemophilus influenzae NOT DETECTED NOT DETECTED Final   Neisseria meningitidis NOT DETECTED NOT DETECTED Final   Pseudomonas aeruginosa NOT DETECTED NOT DETECTED Final   Candida albicans DETECTED (A) NOT DETECTED Final    Comment: CRITICAL RESULT CALLED TO, READ BACK BY AND VERIFIED WITH: A [facility] PHARMD 1903 10/20/16 A BROWNING    Candida glabrata NOT DETECTED NOT DETECTED Final   Candida krusei NOT DETECTED NOT DETECTED Final   Candida parapsilosis NOT DETECTED NOT DETECTED Final   Candida tropicalis NOT DETECTED NOT DETECTED Final    Comment: Performed at [facility], [place] [address] [place], [place] [address]  MRSA PCR Screening     Status: None   Collection Time: 10/19/16  1:56 PM  Result Value Ref Range Status   MRSA by PCR NEGATIVE NEGATIVE Final    Comment:        The GeneXpert MRSA Assay (FDA approved for NASAL specimens only), is one component of a comprehensive MRSA colonization surveillance program. It is not intended to diagnose MRSA infection nor to guide or monitor treatment for MRSA infections.   Culture, Urine      Status: Abnormal   Collection Time: 10/20/16  2:05 PM  Result Value Ref Range Status   Specimen Description URINE, CLEAN CATCH  Final   Special Requests NONE  Final   Culture MULTIPLE SPECIES PRESENT, SUGGEST RECOLLECTION (A)  Final   Report Status 10/21/2016 FINAL  Final  Culture, blood (routine x 2)     Status: None (Preliminary result)   Collection Time: 10/21/16 12:29 PM  Result Value Ref Range Status   Specimen Description BLOOD RIGHT ARM  Final   Special Requests   Final    BOTTLES DRAWN AEROBIC AND ANAEROBIC Blood Culture adequate volume   Culture   Final    NO GROWTH 4 DAYS Performed at [facility], [place] [address] [place], [place] [address]    Report Status PENDING  Incomplete  Culture, blood (routine x 2)     Status: Abnormal   Collection Time: 10/21/16 12:29 PM  Result Value Ref Range Status   Specimen Description BLOOD RIGHT HAND  Final   Special Requests IN PEDIATRIC BOTTLE Blood Culture adequate volume  Final   Culture  Setup Time   Final    YEAST WITH PSEUDOHYPHAE IN PEDIATRIC BOTTLE CRITICAL RESULT CALLED TO, READ BACK BY AND VERIFIED WITH: J LEGGE,PHARMD AT 1033 10/24/16 BY L BENFIELD Performed at [facility], [place] [address] [place], [place] [address]    Culture CANDIDA ALBICANS (A)  Final   Report Status 10/26/2016 FINAL  Final  Culture, blood (Routine X 2) w Reflex to ID Panel     Status: None (Preliminary result)   Collection Time: 10/24/16 12:17 PM  Result Value Ref Range Status   Specimen Description BLOOD LEFT ANTECUBITAL  Final   Special Requests   Final    BOTTLES  DRAWN AEROBIC AND ANAEROBIC Blood Culture adequate volume   Culture   Final    NO GROWTH < 24 HOURS Performed at [facility], [place] [address], [place], [place] [address]    Report Status PENDING  Incomplete  Culture, blood (Routine X 2) w Reflex to ID Panel     Status: None (Preliminary result)   Collection Time: 10/24/16 12:18 PM  Result Value Ref Range Status    Specimen Description BLOOD RIGHT HAND  Final   Special Requests   Final    BOTTLES DRAWN AEROBIC AND ANAEROBIC Blood Culture adequate volume   Culture   Final    NO GROWTH < 24 HOURS Performed at [facility], [place] [address] [place], [place] [address]    Report Status PENDING  Incomplete    Studies/Results: No results found.   Assessment/Plan: Nephrolithiasis Candidemia (BCx 10-11)  Repeat BCx 10-14 are ngtd R ureteral stent placement 9-22  Replaced 10-9  Removed 10-15  Total days of antibiotics: 7 antifungals (6 flunconazole)  Will continue fluconazole 400mg  po daily as previously written, 2 weeks from today She is getting this as a liquid.  Cr stable afebrile WBC normal Repeat BCx are pending.  TTE no vegetation noted.  F/u in ID clinic.  She is to be d/c to [facility] place tomorrow, afterwards unclear. No local family.  Explained to pt, son         Bobby Rumpf MD, FACP Infectious Diseases (pager) [phone] [email or web address] 10/26/2016, 2:11 PM  LOS: 7 days

## 2016-10-26 NOTE — Interval H&P Note (Signed)
History and Physical Interval Note:  The swallowing study didn't show a neurologic issue.  She has done better with her PO intake since then.   She is to have her right ureteral stent removed today.   10/26/2016 11:18 AM  Caroline Griffith  has presented today for surgery, with the diagnosis of RETAINED STENT  The various methods of treatment have been discussed with the patient and family. After consideration of risks, benefits and other options for treatment, the patient has consented to  Procedure(s): CYSTOSCOPY WITH STENT REMOVAL (Right) as a surgical intervention .  The patient's history has been reviewed, patient examined, no change in status, stable for surgery.  I have reviewed the patient's chart and labs.  Questions were answered to the patient's satisfaction.     Valerie Cones J

## 2016-10-26 NOTE — Anesthesia Preprocedure Evaluation (Addendum)
Anesthesia Evaluation  Patient identified by MRN, date of birth, ID band Patient awake    Reviewed: Allergy & Precautions, H&P , NPO status , Patient's Chart, lab work & pertinent test results  Airway Mallampati: I  TM Distance: <3 FB Neck ROM: Full    Dental no notable dental hx. (+) Teeth Intact, Dental Advisory Given   Pulmonary neg pulmonary ROS, former smoker,    Pulmonary exam normal breath sounds clear to auscultation       Cardiovascular negative cardio ROS   Rhythm:Regular Rate:Normal     Neuro/Psych negative neurological ROS  negative psych ROS   GI/Hepatic Neg liver ROS, hiatal hernia,   Endo/Other  negative endocrine ROS  Renal/GU Renal disease  negative genitourinary   Musculoskeletal  (+) Arthritis , Osteoarthritis,    Abdominal   Peds  Hematology negative hematology ROS (+)   Anesthesia Other Findings ANTERIOR AIRWAY  Reproductive/Obstetrics negative OB ROS                            Anesthesia Physical  Anesthesia Plan  ASA: III  Anesthesia Plan: MAC   Post-op Pain Management:    Induction: Intravenous  PONV Risk Score and Plan: 4 or greater and Ondansetron, Dexamethasone, Treatment may vary due to age or medical condition and Midazolam  Airway Management Planned: Nasal Cannula, Natural Airway and LMA  Additional Equipment:   Intra-op Plan:   Post-operative Plan: Extubation in OR  Informed Consent: I have reviewed the patients History and Physical, chart, labs and discussed the procedure including the risks, benefits and alternatives for the proposed anesthesia with the patient or authorized representative who has indicated his/her understanding and acceptance.   Dental advisory given  Plan Discussed with: CRNA  Anesthesia Plan Comments:        Anesthesia Quick Evaluation

## 2016-10-26 NOTE — Progress Notes (Signed)
CONSULT/PROGRESS NOTE    Caroline Griffith  NWG:[id number] DOB: [birth year] DOA: 10/19/2016 PCP: Lavone Orn, MD   Brief Narrative:  Caroline Griffith an 83 y.o.femalewith a history of kidney stones, macular degeneration, varicose veins, OA, presenting after ureteroscopy per urology. She was admitted due to fever post op. TRH consulted due to hypotension. She was recently discharged on 9/24 for a right UPJ stone with associated hydronephrosis and a urinary tract infection with pansensitive Escherichia coli. She was discharged on Keflex for 7 days and then took prophylaxis doses nightly until she followed up with urology. On 10/9 she had ureteroscopy with laser and stent removal and replacement. She was admitted due to fever postop and hypotension.  Subjective:   Patient in bed, appears comfortable, denies any headache, no fever, no chest pain or pressure, no shortness of breath , no abdominal pain. No focal weakness.   Assessment & Plan:    #1 right ureteral calculus status post cystoscopy/sepsis secondary to fungemia anemia/hypotension -  Patient status post recent admission to [facility] with 8 mm R UP J calculus. Status post right ureteral stent placed 10/02/2016, with CYSTOSCOPY/URETEROSCOPY/HOLMIUM LASER/STENT EXCHANGE 10/19/2016, treated with appropriate antibiotics and now antibiotics have been stopped. Seen by ID. Has candidemia for which she had ophthalmology evaluation as well. Currently on fluconazole which per ID she has to continue 400 mg oral daily with a stop date 2 weeks from the date of Stent removal. Kindly arrange for outpatient ID follow-up with Dr. Linus Salmons 7-10 days after surgery.  #2 Candida albicans fungemia  Patient with 2/2 blood cultures positive for Candida albicans the cystoscopy with stent placement 10/19/2016. Patient has been seen in consultation by ID, IV Rocephin discontinued, patient placed on  fluconazole, 2-D echo done showing no evidence of vegetation, repeat  blood cultures so far negative, ophthalmology also saw the patient with a negative eye exam. Per ID can be switched to oral fluconazole at the time of discharge with a finish date s above.  #3 Shortness of breath - acute on chronic diastolic CHF EF 57%, resolved after Lasix.   #4 vitamin D deficiency  Patient with vitamin D 25-hydroxy level at 26.1. We'll place on oral vitamin D supplementation with calcium, she currently does not want to take it we can clearly skip it if she wants to for for a few days thereafter continue.  #5 vitamin B 12 deficiency  Continue vitamin B-12 supplementation.  #6 iron deficiency anemia  H&H stable. Patient would likely benefit from oral iron supplementation on discharge.  #7 hypocalcemia  Corrected calcium 8.8 which is borderline. Vitamin D 25-hydroxy level at 26.1. Patient be started on vitamin D supplementation and calcium. Outpatient follow-up with PCP post DC.  #8. Chronic back and bilateral peripheral neuropathy related foot pain. Stable. Supportive care. These are chronic.  #9. Chronic intermittent dysphagia. This also is a chronic issue for patient going on for several years, she has been cleared by speech for regular diet.  #10. Poor appetite. Placed on Marinol and Remeron. Monitor. Encouraged to increase activity, appetite has improved on discharge Remeron can be continued.    DVT prophylaxis: Lovenox Code Status: Partial Family Communication: Updated patient and daughter at bedside.also son on 10/25/2016 in detail. Disposition Plan: Per primary team which is urology.   Consultants:   ID: Dr Linus Salmons: 10/21/2016  [facility]: Dr. Florene Glen 10/20/2016  PCCM: Dr. Belenda Cruise 10/20/2016  Dr. Posey Pronto ophthalmology   Procedures:   CXR 10/20/2016, 10/22/2016  ECHO 10/22/2016 - EF  50%, some diastolic dysfunction, no vegetations.  Antimicrobials:   Oral cephalexin 10/19/2016>>>> 10/20/2016  IV Eraxis 10/20/2016>>>> 10/21/2016  IV Rocephin  10/19/2016 >>>> 10/20/2016  Irises ciprofloxacin 1 dose  IV Diflucan 10/21/2016     Objective: Vitals:   10/25/16 0614 10/25/16 1304 10/25/16 2101 10/26/16 0437  BP:  117/64 (!) 108/43 128/68  Pulse:  (!) 102 91 87  Resp:   20 18  Temp: 99.1 F (37.3 C) 98.1 F (36.7 C) 98.4 F (36.9 C) 98.3 F (36.8 C)  TempSrc: Oral Oral Oral Oral  SpO2: 94% 94% 98% 94%  Weight:    74 kg (163 lb 2.3 oz)  Height:        Intake/Output Summary (Last 24 hours) at 10/26/16 1100 Last data filed at 10/26/16 0600  Gross per 24 hour  Intake          1601.25 ml  Output              350 ml  Net          1251.25 ml   Filed Weights   10/24/16 0540 10/25/16 0441 10/26/16 0437  Weight: 74 kg (163 lb 2.3 oz) 73.1 kg (161 lb 2.5 oz) 74 kg (163 lb 2.3 oz)    Examination:  Awake Alert, Oriented X 3, No new F.N deficits, flat affect [place].AT,PERRAL Supple Neck,No JVD, No cervical lymphadenopathy appriciated.  Symmetrical Chest wall movement, Good air movement bilaterally, CTAB RRR,No Gallops,Rubs or new Murmurs, No Parasternal Heave +ve B.Sounds, Abd Soft, No tenderness, No organomegaly appriciated, No rebound - guarding or rigidity. No Cyanosis, Clubbing or edema, No new Rash or bruise   Data Reviewed: I have personally reviewed following labs and imaging studies  CBC:  Recent Labs Lab 10/19/16 1225 10/20/16 0317 10/21/16 0334 10/22/16 0430 10/23/16 0423 10/26/16 0436  WBC 2.0* 15.0* 12.2* 10.0 7.3 8.6  NEUTROABS 1.8 13.4*  --   --   --   --   HGB 11.3* 10.6* 10.3* 11.0* 10.4* 10.6*  HCT 35.4* 33.0* 31.9* 34.1* 31.8* 32.6*  MCV 94.4 94.0 94.4 93.2 91.6 91.8  PLT 337 288 210 222 247 354   Basic Metabolic Panel:  Recent Labs Lab 10/21/16 0334 10/22/16 0430 10/23/16 0423 10/24/16 0446 10/26/16 0436  NA 136 137 139 140 136  K 3.7 3.6 3.0* 3.9 4.1  CL 105 103 103 105 100*  CO2 24 24 28 29 29   GLUCOSE 84 65 84 79 118*  BUN 10 10 10 8 10   CREATININE 1.06* 0.92 0.81 0.82 0.88    CALCIUM 7.5* 7.6* 7.8* 8.1* 8.1*  MG  --   --   --  1.7  --    GFR: Estimated Creatinine Clearance: 49.5 mL/min (by C-G formula based on SCr of 0.88 mg/dL). Liver Function Tests:  Recent Labs Lab 10/20/16 1330  ALBUMIN 2.3*   No results for input(s): LIPASE, AMYLASE in the last 168 hours. No results for input(s): AMMONIA in the last 168 hours. Coagulation Profile: No results for input(s): INR, PROTIME in the last 168 hours. Cardiac Enzymes: No results for input(s): CKTOTAL, CKMB, CKMBINDEX, TROPONINI in the last 168 hours. BNP (last 3 results) No results for input(s): PROBNP in the last 8760 hours. HbA1C: No results for input(s): HGBA1C in the last 72 hours. CBG: No results for input(s): GLUCAP in the last 168 hours. Lipid Profile: No results for input(s): CHOL, HDL, LDLCALC, TRIG, CHOLHDL, LDLDIRECT in the last 72 hours. Thyroid Function Tests: No results for  input(s): TSH, T4TOTAL, FREET4, T3FREE, THYROIDAB in the last 72 hours. Anemia Panel: No results for input(s): VITAMINB12, FOLATE, FERRITIN, TIBC, IRON, RETICCTPCT in the last 72 hours. Sepsis Labs:  Recent Labs Lab 10/19/16 1225 10/20/16 1123 10/20/16 1407  LATICACIDVEN 1.6 1.6 1.6    Recent Results (from the past 240 hour(s))  Culture, blood (Routine X 2) w Reflex to ID Panel     Status: Abnormal   Collection Time: 10/19/16 12:33 PM  Result Value Ref Range Status   Specimen Description BLOOD LEFT HAND  Final   Special Requests IN PEDIATRIC BOTTLE Blood Culture adequate volume  Final   Culture  Setup Time   Final    YEAST IN PEDIATRIC BOTTLE CRITICAL VALUE NOTED.  VALUE IS CONSISTENT WITH PREVIOUSLY REPORTED AND CALLED VALUE. Performed at [facility], [place] [address] [place], [place] [address]    Culture CANDIDA ALBICANS (A)  Final   Report Status 10/23/2016 FINAL  Final  Culture, blood (Routine X 2) w Reflex to ID Panel     Status: Abnormal   Collection Time: 10/19/16 12:34 PM  Result Value Ref  Range Status   Specimen Description BLOOD RIGHT ARM  Final   Special Requests   Final    BOTTLES DRAWN AEROBIC AND ANAEROBIC Blood Culture adequate volume   Culture  Setup Time   Final    YEAST IN BOTH AEROBIC AND ANAEROBIC BOTTLES CRITICAL RESULT CALLED TO, READ BACK BY AND VERIFIED WITH: A [facility] PHARMD 1903 10/20/16 A BROWNING Performed at [facility], [place] [address], [place], [place] [address]    Culture CANDIDA ALBICANS (A)  Final   Report Status 10/23/2016 FINAL  Final  Blood Culture ID Panel (Reflexed)     Status: Abnormal   Collection Time: 10/19/16 12:34 PM  Result Value Ref Range Status   Enterococcus species NOT DETECTED NOT DETECTED Final   Listeria monocytogenes NOT DETECTED NOT DETECTED Final   Staphylococcus species NOT DETECTED NOT DETECTED Final   Staphylococcus aureus NOT DETECTED NOT DETECTED Final   Streptococcus species NOT DETECTED NOT DETECTED Final   Streptococcus agalactiae NOT DETECTED NOT DETECTED Final   Streptococcus pneumoniae NOT DETECTED NOT DETECTED Final   Streptococcus pyogenes NOT DETECTED NOT DETECTED Final   Acinetobacter baumannii NOT DETECTED NOT DETECTED Final   Enterobacteriaceae species NOT DETECTED NOT DETECTED Final   Enterobacter cloacae complex NOT DETECTED NOT DETECTED Final   Escherichia coli NOT DETECTED NOT DETECTED Final   Klebsiella oxytoca NOT DETECTED NOT DETECTED Final   Klebsiella pneumoniae NOT DETECTED NOT DETECTED Final   Proteus species NOT DETECTED NOT DETECTED Final   Serratia marcescens NOT DETECTED NOT DETECTED Final   Haemophilus influenzae NOT DETECTED NOT DETECTED Final   Neisseria meningitidis NOT DETECTED NOT DETECTED Final   Pseudomonas aeruginosa NOT DETECTED NOT DETECTED Final   Candida albicans DETECTED (A) NOT DETECTED Final    Comment: CRITICAL RESULT CALLED TO, READ BACK BY AND VERIFIED WITH: A [facility] PHARMD 1903 10/20/16 A BROWNING    Candida glabrata NOT DETECTED NOT DETECTED Final   Candida  krusei NOT DETECTED NOT DETECTED Final   Candida parapsilosis NOT DETECTED NOT DETECTED Final   Candida tropicalis NOT DETECTED NOT DETECTED Final    Comment: Performed at [facility], [address] [place], [place] [address]  MRSA PCR Screening     Status: None   Collection Time: 10/19/16  1:56 PM  Result Value Ref Range Status   MRSA  by PCR NEGATIVE NEGATIVE Final    Comment:        The GeneXpert MRSA Assay (FDA approved for NASAL specimens only), is one component of a comprehensive MRSA colonization surveillance program. It is not intended to diagnose MRSA infection nor to guide or monitor treatment for MRSA infections.   Culture, Urine     Status: Abnormal   Collection Time: 10/20/16  2:05 PM  Result Value Ref Range Status   Specimen Description URINE, CLEAN CATCH  Final   Special Requests NONE  Final   Culture MULTIPLE SPECIES PRESENT, SUGGEST RECOLLECTION (A)  Final   Report Status 10/21/2016 FINAL  Final  Culture, blood (routine x 2)     Status: None (Preliminary result)   Collection Time: 10/21/16 12:29 PM  Result Value Ref Range Status   Specimen Description BLOOD RIGHT ARM  Final   Special Requests   Final    BOTTLES DRAWN AEROBIC AND ANAEROBIC Blood Culture adequate volume   Culture   Final    NO GROWTH 4 DAYS Performed at [facility], [place] [address], [place], [place] [address]    Report Status PENDING  Incomplete  Culture, blood (routine x 2)     Status: Abnormal   Collection Time: 10/21/16 12:29 PM  Result Value Ref Range Status   Specimen Description BLOOD RIGHT HAND  Final   Special Requests IN PEDIATRIC BOTTLE Blood Culture adequate volume  Final   Culture  Setup Time   Final    YEAST WITH PSEUDOHYPHAE IN PEDIATRIC BOTTLE CRITICAL RESULT CALLED TO, READ BACK BY AND VERIFIED WITH: J LEGGE,PHARMD AT 1033 10/24/16 BY L BENFIELD Performed at [facility], [place] [address], [place], [place] [address]    Culture CANDIDA ALBICANS (A)  Final    Report Status 10/26/2016 FINAL  Final  Culture, blood (Routine X 2) w Reflex to ID Panel     Status: None (Preliminary result)   Collection Time: 10/24/16 12:17 PM  Result Value Ref Range Status   Specimen Description BLOOD LEFT ANTECUBITAL  Final   Special Requests   Final    BOTTLES DRAWN AEROBIC AND ANAEROBIC Blood Culture adequate volume   Culture   Final    NO GROWTH < 24 HOURS Performed at [facility], [place] [address] [place], [place] [address]    Report Status PENDING  Incomplete  Culture, blood (Routine X 2) w Reflex to ID Panel     Status: None (Preliminary result)   Collection Time: 10/24/16 12:18 PM  Result Value Ref Range Status   Specimen Description BLOOD RIGHT HAND  Final   Special Requests   Final    BOTTLES DRAWN AEROBIC AND ANAEROBIC Blood Culture adequate volume   Culture   Final    NO GROWTH < 24 HOURS Performed at [facility], [place] [address] [place],  [address]    Report Status PENDING  Incomplete         Radiology Studies: No results found.      Scheduled Meds: . calcium carbonate  2 tablet Oral TID WC  . cholecalciferol  800 Units Oral Daily  . cyanocobalamin  1,000 mcg Intramuscular q morning - 10a  . dronabinol  2.5 mg Oral BID AC  . enoxaparin (LOVENOX) injection  40 mg Subcutaneous Q24H  . feeding supplement (ENSURE ENLIVE)  1 Bottle Oral TID BM  . fluconazole  400 mg Oral Daily  . latanoprost  1 drop Both Eyes  QHS  . mirtazapine  15 mg Oral QHS  . nystatin  5 mL Oral QID   Continuous Infusions: . dextrose 5 % and 0.45 % NaCl with KCl 20 mEq/L 75 mL/hr at 10/25/16 2134     LOS: 7 days    Time spent: 35 minutes   Signature  Lala Lund M.D on 10/26/2016 at 11:00 AM  Between 7am to 7pm - Pager - [phone] ( page via [email or web address], text pages only, please mention full 10 digit call back number).  After 7pm go to [email or web address] - password [facility]

## 2016-10-26 NOTE — Op Note (Signed)
Caroline Griffith, Caroline Griffith                ACCOUNT NO.:  [id number]  MEDICAL RECORD NO.:  [id number]  LOCATION:  PERIO                        FACILITY:  [facility]  PHYSICIAN:  Marshall Cork. [name], M.D.    DATE OF BIRTH:  September 27, 1930  DATE OF PROCEDURE:  10/26/2016 DATE OF DISCHARGE:                              OPERATIVE REPORT   PROCEDURE:  Cystoscopy with removal of right double-J stent.  PREOPERATIVE DIAGNOSIS:  Retained right double-J stent.  POSTOPERATIVE DIAGNOSIS:  Retained right double-J stent.  SURGEON:  Marshall Cork. [name], M.D.  ANESTHESIA:  General.  SPECIMEN:  None.  BLOOD LOSS:  None.  COMPLICATIONS:  None.  INDICATIONS:  Ms. Marich is an 83 year old white female, who underwent ureteroscopy on October 19, 2016.  Her urine was turbid.  She became febrile postoperatively and was found to have fungemia.  She has been on fluconazole and is now to undergo removal of the stent to try to remove all foreign bodies findings.  FINDINGS AND PROCEDURE:  She was taken to the operating room where she was given sedation as needed.  She was placed in lithotomy position and fitted with PAS hose.  Her perineum and genitalia were prepped with Betadine solution and she was draped in usual sterile fashion.  Cystoscopy was performed using a 23-French scope and 30-degree lens. Examination revealed a normal urethra.  The bladder wall had mild erythema.  The urine was clear.  The stent was noted at the right ureteral orifice.  It was grasped with grasping forceps and removed without difficulty.  The bladder was drained.  She was taken down from lithotomy position.  Her anesthetic was reversed.  She was moved to the recovery room in stable condition.  There were no complications.    Marshall Cork. [name], M.D.    JJW/MEDQ  D:  10/26/2016  T:  10/26/2016  Job:  [id number]

## 2016-10-26 NOTE — Clinical Social Work Placement (Signed)
Patient received and accepted bed offer at [facility]. CSW started insurance auth with [employer] staff member Tammy, awaiting return call.   CLINICAL SOCIAL WORK PLACEMENT  NOTE  Date:  10/26/2016  Patient Details  Name: Caroline Griffith MRN: [id number] Date of Birth: [birth year]  Clinical Social Work is seeking post-discharge placement for this patient at the [place] level of care (*CSW will initial, date and re-position this form in  chart as items are completed):  Yes   Patient/family provided with [place] Work Department's list of facilities offering this level of care within the geographic area requested by the patient (or if unable, by the patient's family).  Yes   Patient/family informed of their freedom to choose among providers that offer the needed level of care, that participate in Medicare, Medicaid or managed care program needed by the patient, have an available bed and are willing to accept the patient.  Yes   Patient/family informed of [facility]'s ownership interest in [facility] and [facility], as well as of the fact that they are under no obligation to receive care at these facilities.  PASRR submitted to EDS on       PASRR number received on       Existing PASRR number confirmed on 10/24/16     FL2 transmitted to all facilities in geographic area requested by pt/family on 10/24/16     FL2 transmitted to all facilities within larger geographic area on       Patient informed that his/her managed care company has contracts with or will negotiate with certain facilities, including the following:        Yes   Patient/family informed of bed offers received.  Patient chooses bed at [facility]     Physician recommends and patient chooses bed at      Patient to be transferred to   on  .  Patient to be transferred to facility by       Patient family notified on   of transfer.  Name of family member  notified:        PHYSICIAN       Additional Comment:    _______________________________________________ Burnis Medin, LCSW 10/26/2016, 3:00 PM

## 2016-10-26 NOTE — Brief Op Note (Signed)
10/19/2016 - 10/26/2016  11:50 AM  PATIENT:  Caroline Griffith  83 y.o. female  PRE-OPERATIVE DIAGNOSIS:  RETAINED STENT  POST-OPERATIVE DIAGNOSIS:  RETAINED STENT  PROCEDURE:  Procedure(s): CYSTOSCOPY WITH STENT REMOVAL (Right)  SURGEON:  Surgeon(s) and Role:    Irine Seal, MD - Primary  PHYSICIAN ASSISTANT:   ASSISTANTS: none   ANESTHESIA:   MAC  EBL:  No intake/output data recorded.  BLOOD ADMINISTERED:none  DRAINS: none   LOCAL MEDICATIONS USED:  NONE  SPECIMEN:  No Specimen  DISPOSITION OF SPECIMEN:  N/A  COUNTS:  YES  TOURNIQUET:  * No tourniquets in log *  DICTATION: .Other Dictation: Dictation Number [id number]  PLAN OF CARE: Admit to inpatient   PATIENT DISPOSITION:  PACU - hemodynamically stable.   Delay start of Pharmacological VTE agent (>24hrs) due to surgical blood loss or risk of bleeding: not applicable

## 2016-10-26 NOTE — Care Management Important Message (Signed)
Important Message  Patient Details  Name: Caroline Griffith MRN: [id number] Date of Birth: Mar 08, 1930   Medicare Important Message Given:  Yes    Kerin Salen 10/26/2016, 11:33 AMImportant Message  Patient Details  Name: Caroline Griffith MRN: [id number] Date of Birth: October 24, 1930   Medicare Important Message Given:  Yes    Kerin Salen 10/26/2016, 11:33 AM

## 2016-10-26 NOTE — Transfer of Care (Signed)
Immediate Anesthesia Transfer of Care Note  Patient: Caroline Griffith  Procedure(s) Performed: CYSTOSCOPY WITH STENT REMOVAL (Right )  Patient Location: PACU  Anesthesia Type:MAC  Level of Consciousness: awake, alert  and oriented  Airway & Oxygen Therapy: Patient Spontanous Breathing and Patient connected to nasal cannula oxygen  Post-op Assessment: Report given to RN  Post vital signs: Reviewed and stable  Last Vitals:  Vitals:   10/26/16 0437 10/26/16 1100  BP: 128/68 125/62  Pulse: 87 84  Resp: 18 18  Temp: 36.8 C 37 C  SpO2: 94% 94%    Last Pain:  Vitals:   10/26/16 1121  TempSrc:   PainSc: 0-No pain      Patients Stated Pain Goal: 3 (73/56/70 1410)  Complications: No apparent anesthesia complications

## 2016-10-26 NOTE — Anesthesia Procedure Notes (Signed)
Procedure Name: MAC Date/Time: 10/26/2016 11:47 AM Performed by: Bethena Roys T Pre-anesthesia Checklist: Patient identified, Timeout performed, Emergency Drugs available, Suction available and Patient being monitored Patient Re-evaluated:Patient Re-evaluated prior to induction Oxygen Delivery Method: Nasal cannula Placement Confirmation: positive ETCO2

## 2016-10-27 ENCOUNTER — Inpatient Hospital Stay (HOSPITAL_COMMUNITY): Payer: PPO

## 2016-10-27 ENCOUNTER — Encounter (HOSPITAL_COMMUNITY): Payer: Self-pay | Admitting: Urology

## 2016-10-27 DIAGNOSIS — R278 Other lack of coordination: Secondary | ICD-10-CM | POA: Diagnosis not present

## 2016-10-27 DIAGNOSIS — E8809 Other disorders of plasma-protein metabolism, not elsewhere classified: Secondary | ICD-10-CM | POA: Diagnosis not present

## 2016-10-27 DIAGNOSIS — Z66 Do not resuscitate: Secondary | ICD-10-CM | POA: Diagnosis not present

## 2016-10-27 DIAGNOSIS — R2232 Localized swelling, mass and lump, left upper limb: Secondary | ICD-10-CM | POA: Diagnosis not present

## 2016-10-27 DIAGNOSIS — G9009 Other idiopathic peripheral autonomic neuropathy: Secondary | ICD-10-CM | POA: Diagnosis not present

## 2016-10-27 DIAGNOSIS — R5381 Other malaise: Secondary | ICD-10-CM | POA: Diagnosis not present

## 2016-10-27 DIAGNOSIS — I509 Heart failure, unspecified: Secondary | ICD-10-CM | POA: Diagnosis not present

## 2016-10-27 DIAGNOSIS — R509 Fever, unspecified: Secondary | ICD-10-CM | POA: Diagnosis not present

## 2016-10-27 DIAGNOSIS — R4189 Other symptoms and signs involving cognitive functions and awareness: Secondary | ICD-10-CM | POA: Diagnosis not present

## 2016-10-27 DIAGNOSIS — Z87440 Personal history of urinary (tract) infections: Secondary | ICD-10-CM | POA: Diagnosis not present

## 2016-10-27 DIAGNOSIS — L989 Disorder of the skin and subcutaneous tissue, unspecified: Secondary | ICD-10-CM | POA: Diagnosis not present

## 2016-10-27 DIAGNOSIS — Z7951 Long term (current) use of inhaled steroids: Secondary | ICD-10-CM | POA: Diagnosis not present

## 2016-10-27 DIAGNOSIS — I959 Hypotension, unspecified: Secondary | ICD-10-CM | POA: Diagnosis not present

## 2016-10-27 DIAGNOSIS — D649 Anemia, unspecified: Secondary | ICD-10-CM | POA: Diagnosis not present

## 2016-10-27 DIAGNOSIS — M6281 Muscle weakness (generalized): Secondary | ICD-10-CM | POA: Diagnosis not present

## 2016-10-27 DIAGNOSIS — Z96651 Presence of right artificial knee joint: Secondary | ICD-10-CM | POA: Diagnosis not present

## 2016-10-27 DIAGNOSIS — B965 Pseudomonas (aeruginosa) (mallei) (pseudomallei) as the cause of diseases classified elsewhere: Secondary | ICD-10-CM | POA: Diagnosis not present

## 2016-10-27 DIAGNOSIS — Z96641 Presence of right artificial hip joint: Secondary | ICD-10-CM | POA: Diagnosis not present

## 2016-10-27 DIAGNOSIS — M1990 Unspecified osteoarthritis, unspecified site: Secondary | ICD-10-CM | POA: Diagnosis not present

## 2016-10-27 DIAGNOSIS — N189 Chronic kidney disease, unspecified: Secondary | ICD-10-CM | POA: Diagnosis not present

## 2016-10-27 DIAGNOSIS — Y95 Nosocomial condition: Secondary | ICD-10-CM | POA: Diagnosis present

## 2016-10-27 DIAGNOSIS — Z825 Family history of asthma and other chronic lower respiratory diseases: Secondary | ICD-10-CM | POA: Diagnosis not present

## 2016-10-27 DIAGNOSIS — Z87442 Personal history of urinary calculi: Secondary | ICD-10-CM | POA: Diagnosis not present

## 2016-10-27 DIAGNOSIS — E860 Dehydration: Secondary | ICD-10-CM | POA: Diagnosis not present

## 2016-10-27 DIAGNOSIS — N201 Calculus of ureter: Secondary | ICD-10-CM | POA: Diagnosis not present

## 2016-10-27 DIAGNOSIS — M19031 Primary osteoarthritis, right wrist: Secondary | ICD-10-CM | POA: Diagnosis not present

## 2016-10-27 DIAGNOSIS — M2540 Effusion, unspecified joint: Secondary | ICD-10-CM | POA: Diagnosis not present

## 2016-10-27 DIAGNOSIS — G9341 Metabolic encephalopathy: Secondary | ICD-10-CM | POA: Diagnosis not present

## 2016-10-27 DIAGNOSIS — R404 Transient alteration of awareness: Secondary | ICD-10-CM | POA: Diagnosis not present

## 2016-10-27 DIAGNOSIS — M542 Cervicalgia: Secondary | ICD-10-CM | POA: Diagnosis not present

## 2016-10-27 DIAGNOSIS — R1310 Dysphagia, unspecified: Secondary | ICD-10-CM | POA: Diagnosis not present

## 2016-10-27 DIAGNOSIS — J9811 Atelectasis: Secondary | ICD-10-CM | POA: Diagnosis not present

## 2016-10-27 DIAGNOSIS — I4581 Long QT syndrome: Secondary | ICD-10-CM | POA: Diagnosis not present

## 2016-10-27 DIAGNOSIS — R1312 Dysphagia, oropharyngeal phase: Secondary | ICD-10-CM | POA: Diagnosis not present

## 2016-10-27 DIAGNOSIS — K8020 Calculus of gallbladder without cholecystitis without obstruction: Secondary | ICD-10-CM | POA: Diagnosis not present

## 2016-10-27 DIAGNOSIS — E440 Moderate protein-calorie malnutrition: Secondary | ICD-10-CM | POA: Diagnosis not present

## 2016-10-27 DIAGNOSIS — R531 Weakness: Secondary | ICD-10-CM | POA: Diagnosis not present

## 2016-10-27 DIAGNOSIS — Z90710 Acquired absence of both cervix and uterus: Secondary | ICD-10-CM | POA: Diagnosis not present

## 2016-10-27 DIAGNOSIS — M79641 Pain in right hand: Secondary | ICD-10-CM | POA: Diagnosis not present

## 2016-10-27 DIAGNOSIS — B49 Unspecified mycosis: Secondary | ICD-10-CM | POA: Diagnosis not present

## 2016-10-27 DIAGNOSIS — R6520 Severe sepsis without septic shock: Secondary | ICD-10-CM | POA: Diagnosis not present

## 2016-10-27 DIAGNOSIS — G9340 Encephalopathy, unspecified: Secondary | ICD-10-CM | POA: Diagnosis not present

## 2016-10-27 DIAGNOSIS — T83192A Other mechanical complication of urinary stent, initial encounter: Secondary | ICD-10-CM | POA: Diagnosis not present

## 2016-10-27 DIAGNOSIS — R9431 Abnormal electrocardiogram [ECG] [EKG]: Secondary | ICD-10-CM | POA: Diagnosis not present

## 2016-10-27 DIAGNOSIS — N390 Urinary tract infection, site not specified: Secondary | ICD-10-CM | POA: Diagnosis not present

## 2016-10-27 DIAGNOSIS — Z87891 Personal history of nicotine dependence: Secondary | ICD-10-CM | POA: Diagnosis not present

## 2016-10-27 DIAGNOSIS — N179 Acute kidney failure, unspecified: Secondary | ICD-10-CM | POA: Diagnosis not present

## 2016-10-27 DIAGNOSIS — R4182 Altered mental status, unspecified: Secondary | ICD-10-CM | POA: Diagnosis not present

## 2016-10-27 DIAGNOSIS — Z7401 Bed confinement status: Secondary | ICD-10-CM | POA: Diagnosis not present

## 2016-10-27 DIAGNOSIS — Z8249 Family history of ischemic heart disease and other diseases of the circulatory system: Secondary | ICD-10-CM | POA: Diagnosis not present

## 2016-10-27 DIAGNOSIS — R4020 Unspecified coma: Secondary | ICD-10-CM | POA: Diagnosis not present

## 2016-10-27 DIAGNOSIS — H3530 Unspecified macular degeneration: Secondary | ICD-10-CM | POA: Diagnosis not present

## 2016-10-27 DIAGNOSIS — A419 Sepsis, unspecified organism: Secondary | ICD-10-CM | POA: Diagnosis not present

## 2016-10-27 DIAGNOSIS — Z6825 Body mass index (BMI) 25.0-25.9, adult: Secondary | ICD-10-CM | POA: Diagnosis not present

## 2016-10-27 DIAGNOSIS — N10 Acute tubulo-interstitial nephritis: Secondary | ICD-10-CM | POA: Diagnosis not present

## 2016-10-27 DIAGNOSIS — J189 Pneumonia, unspecified organism: Secondary | ICD-10-CM | POA: Diagnosis not present

## 2016-10-27 DIAGNOSIS — B379 Candidiasis, unspecified: Secondary | ICD-10-CM | POA: Diagnosis not present

## 2016-10-27 MED ORDER — DRONABINOL 2.5 MG PO CAPS: 2.5000 mg | ORAL_CAPSULE | Freq: Two times a day (BID) | ORAL | 0 refills | Status: AC

## 2016-10-27 MED ORDER — MIRTAZAPINE 15 MG PO TBDP: 15.0000 mg | ORAL_TABLET | Freq: Every day | ORAL | 0 refills | Status: AC

## 2016-10-27 MED ORDER — TRAMADOL HCL 50 MG PO TABS: 50.0000 mg | ORAL_TABLET | Freq: Four times a day (QID) | ORAL | 0 refills | Status: AC | PRN

## 2016-10-27 MED ORDER — POLYETHYLENE GLYCOL 3350 17 G PO PACK: 17.0000 g | PACK | Freq: Every day | ORAL | 0 refills | Status: AC | PRN

## 2016-10-27 MED ORDER — BISACODYL 5 MG PO TBEC: 5.0000 mg | DELAYED_RELEASE_TABLET | Freq: Every day | ORAL | 0 refills | Status: AC | PRN

## 2016-10-27 NOTE — Progress Notes (Signed)
1 Day Post-Op  Subjective: Caroline Griffith had the right ureteral stent removed yesterday.   She has no flank pain but she has a new complaint of redness and swelling of the right anterior wrist.   She has persistent complaints of back and neck pain which are chronic.   She continues to have a very poor po intake despite Marinol and Remeron.    ROS:  Review of Systems  Constitutional: Negative for fever.  Cardiovascular: Negative for leg swelling.  Gastrointestinal: Negative for abdominal pain.  Musculoskeletal: Positive for back pain and neck pain.    Anti-infectives: Anti-infectives    Start     Dose/Rate Route Frequency Ordered Stop   10/24/16 1000  fluconazole (DIFLUCAN) 40 MG/ML suspension 400 mg     400 mg Oral Daily 10/24/16 0824     10/24/16 0000  fluconazole (DIFLUCAN) 40 MG/ML suspension     400 mg Oral Daily 10/24/16 0853 11/11/16 2359   10/23/16 1400  fluconazole (DIFLUCAN) tablet 400 mg  Status:  Discontinued     400 mg Oral Daily 10/23/16 0957 10/24/16 0824   10/22/16 1600  fluconazole (DIFLUCAN) IVPB 200 mg  Status:  Discontinued     200 mg 100 mL/hr over 60 Minutes Intravenous Every 24 hours 10/21/16 1458 10/23/16 0957   10/22/16 1400  fluconazole (DIFLUCAN) IVPB 400 mg  Status:  Discontinued     400 mg 100 mL/hr over 120 Minutes Intravenous Every 24 hours 10/21/16 1405 10/21/16 1454   10/21/16 1800  anidulafungin (ERAXIS) 100 mg in sodium chloride 0.9 % 100 mL IVPB  Status:  Discontinued     100 mg 78 mL/hr over 100 Minutes Intravenous Every 24 hours 10/20/16 1719 10/21/16 1405   10/21/16 1600  fluconazole (DIFLUCAN) IVPB 400 mg     400 mg 100 mL/hr over 120 Minutes Intravenous  Once 10/21/16 1458 10/21/16 1846   10/21/16 1415  fluconazole (DIFLUCAN) IVPB 800 mg  Status:  Discontinued     800 mg 200 mL/hr over 120 Minutes Intravenous  Once 10/21/16 1405 10/21/16 1453   10/20/16 1800  anidulafungin (ERAXIS) 200 mg in sodium chloride 0.9 % 200 mL IVPB     200 mg 78 mL/hr  over 200 Minutes Intravenous Once 10/20/16 1719 10/20/16 2109   10/20/16 0700  cefTRIAXone (ROCEPHIN) 2 g in dextrose 5 % 50 mL IVPB  Status:  Discontinued     2 g 100 mL/hr over 30 Minutes Intravenous Daily before breakfast 10/20/16 0656 10/21/16 1414   10/19/16 1600  cephALEXin (KEFLEX) capsule 250 mg  Status:  Discontinued     250 mg Oral 3 times daily 10/19/16 1357 10/20/16 0656   10/19/16 1300  cefTRIAXone (ROCEPHIN) 1 g in dextrose 5 % 50 mL IVPB  Status:  Discontinued     1 g 100 mL/hr over 30 Minutes Intravenous Every 24 hours 10/19/16 1200 10/19/16 1412   10/19/16 0615  ciprofloxacin (CIPRO) IVPB 400 mg     400 mg 200 mL/hr over 60 Minutes Intravenous  Once 10/19/16 0604 10/19/16 0845   10/19/16 0000  cephALEXin (KEFLEX) 500 MG capsule  Status:  Discontinued     500 mg Oral Daily at bedtime 10/19/16 1008 10/19/16    10/19/16 0000  cephALEXin (KEFLEX) 250 MG capsule  Status:  Discontinued    Comments:  Please take 1 at bedtime after the first 5 days.   250 mg Oral 3 times daily 10/19/16 1145 10/24/16       Current Facility-Administered Medications  Medication Dose Route Frequency Provider Last Rate Last Dose  . 0.9 %  sodium chloride infusion  250 mL Intravenous PRN Irine Seal, MD      . acetaminophen (TYLENOL) tablet 650 mg  650 mg Oral Q4H PRN Irine Seal, MD       Or  . acetaminophen (TYLENOL) suppository 650 mg  650 mg Rectal Q4H PRN Irine Seal, MD      . bisacodyl (DULCOLAX) EC tablet 5 mg  5 mg Oral Daily PRN Thurnell Lose, MD      . calcium carbonate (OS-CAL - dosed in mg of elemental calcium) tablet 1,000 mg of elemental calcium  2 tablet Oral TID WC Eugenie Filler, MD   1,000 mg of elemental calcium at 10/26/16 1745  . cholecalciferol (VITAMIN D) tablet 800 Units  800 Units Oral Daily Eugenie Filler, MD   800 Units at 10/25/16 1223  . cyanocobalamin ((VITAMIN B-12)) injection 1,000 mcg  1,000 mcg Intramuscular q morning - 10a Eugenie Filler, MD   1,000  mcg at 10/25/16 1229  . dextrose 5 % and 0.45 % NaCl with KCl 20 mEq/L infusion   Intravenous Continuous Irine Seal, MD 75 mL/hr at 10/25/16 2134    . dronabinol (MARINOL) capsule 2.5 mg  2.5 mg Oral BID AC Thurnell Lose, MD   2.5 mg at 10/26/16 1745  . enoxaparin (LOVENOX) injection 40 mg  40 mg Subcutaneous Q24H Polly Cobia, RPH   40 mg at 10/26/16 1438  . feeding supplement (ENSURE ENLIVE) (ENSURE ENLIVE) liquid 237 mL  1 Bottle Oral TID BM Stasia Cavalier, MD   237 mL at 10/26/16 1400  . fluconazole (DIFLUCAN) 40 MG/ML suspension 400 mg  400 mg Oral Daily Stasia Cavalier, MD   400 mg at 10/25/16 1204  . hyoscyamine (LEVSIN SL) SL tablet 0.125 mg  0.125 mg Sublingual Q4H PRN Irine Seal, MD      . latanoprost (XALATAN) 0.005 % ophthalmic solution 1 drop  1 drop Both Eyes QHS Irine Seal, MD   1 drop at 10/26/16 2128  . mirtazapine (REMERON SOL-TAB) disintegrating tablet 15 mg  15 mg Oral QHS Thurnell Lose, MD   15 mg at 10/26/16 2128  . nystatin (MYCOSTATIN) 100000 UNIT/ML suspension 500,000 Units  5 mL Oral QID Reyne Dumas, MD   500,000 Units at 10/26/16 2128  . ondansetron (ZOFRAN) injection 4 mg  4 mg Intravenous Q4H PRN Irine Seal, MD   4 mg at 10/25/16 4196  . polyethylene glycol (MIRALAX / GLYCOLAX) packet 17 g  17 g Oral Daily PRN Thurnell Lose, MD      . senna-docusate (Senokot-S) tablet 1 tablet  1 tablet Oral QHS PRN Irine Seal, MD      . sodium chloride flush (NS) 0.9 % injection 3 mL  3 mL Intravenous Q12H Irine Seal, MD      . sodium chloride flush (NS) 0.9 % injection 3 mL  3 mL Intravenous PRN Irine Seal, MD      . sodium phosphate (FLEET) 7-19 GM/118ML enema 1 enema  1 enema Rectal Once PRN Irine Seal, MD      . traMADol Veatrice Bourbon) tablet 50-100 mg  50-100 mg Oral Q6H PRN Irine Seal, MD   100 mg at 10/27/16 0600  . zolpidem (AMBIEN) tablet 5 mg  5 mg Oral QHS PRN Irine Seal, MD   5 mg at 10/23/16 2221     Objective: Vital signs in last  24  hours: Temp:  [98.2 F (36.8 C)-99.2 F (37.3 C)] 99.1 F (37.3 C) (10/17 0500) Pulse Rate:  [83-92] 92 (10/17 0500) Resp:  [16-24] 18 (10/17 0500) BP: (111-135)/(48-76) 111/56 (10/17 0500) SpO2:  [90 %-100 %] 98 % (10/17 0500)  Intake/Output from previous day: 10/16 0701 - 10/17 0700 In: 1075 [I.V.:1075] Out: 900 [Urine:900] Intake/Output this shift: No intake/output data recorded.   Physical Exam  Constitutional: She is oriented to person, place, and time and well-developed, well-nourished, and in no distress.  Cardiovascular: Normal rate, regular rhythm and normal heart sounds.   Pulmonary/Chest: Effort normal and breath sounds normal. No respiratory distress.  Abdominal: Soft. There is no tenderness.  Musculoskeletal: Normal range of motion. She exhibits no edema or tenderness.  Neurological: She is alert and oriented to person, place, and time.  Skin: Skin is warm. There is erythema (on right wrist).  Vitals reviewed.   Lab Results:   Recent Labs  10/26/16 0436  WBC 8.6  HGB 10.6*  HCT 32.6*  PLT 321   BMET  Recent Labs  10/26/16 0436  NA 136  K 4.1  CL 100*  CO2 29  GLUCOSE 118*  BUN 10  CREATININE 0.88  CALCIUM 8.1*   PT/INR No results for input(s): LABPROT, INR in the last 72 hours. ABG No results for input(s): PHART, HCO3 in the last 72 hours.  Invalid input(s): PCO2, PO2  Studies/Results: No results found.  I discussed her case with Dr. Manuella Ghazi.   Assessment and Plan: 1. Hx of right ureteral stone with sepsis complicated by fungemia post ureteroscopy.   Stent removed yesterday.   No flank pain or fever.  Plan to DC to SNF in near future when bed available.   2. New complaint of pain in right wrist with erythema and swelling that appears secondary to IV site.   Will request moist heat.   3. Deconditioning and poor appetite.  PT saw yesterday.   I will discuss with Dr. Manuella Ghazi.    4. Chronic back and neck pain.         LOS: 8 days     Malka So 10/27/2016 [phone] ID: Caroline Griffith, female   DOB: 11/24/1930, 83 y.o.   MRN: [id number]

## 2016-10-27 NOTE — Discharge Summary (Signed)
Physician Discharge Summary  Caroline Griffith TMH:[id number] DOB: 1930-04-24 DOA: 10/19/2016  PCP: Lavone Orn, MD  Admit date: 10/19/2016 Discharge date: 10/27/2016  Admitted From: Home Disposition: [facility]  Recommendations for Outpatient Follow-up:  1. Follow up with PCP in 1-2 weeks 2. Follow up with Urology and Opthomalogy as scheduled 3. Follow up with Dr. Linus Salmons, ID, in 7-10 days for fungemia as scheduled  Home Health:No Equipment/Devices: [facility]  Discharge Condition:Stable CODE STATUS:Partial Diet recommendation: Heart Healthy  Brief/Interim Summary: Caroline Griffith an 83 y.o.femalewith a history of kidney stones, macular degeneration, varicose veins, OA, presenting after ureteroscopy per urology. She was admitted due to fever post op. TRH consulted due to hypotension. She was recently discharged on 9/24 for a right UPJ stone with associated hydronephrosis and a urinary tract infection with pansensitive Escherichia coli. She was discharged on Keflex for 7 days and then took prophylaxis doses nightly until she followed up with urology. On 10/9 she had ureteroscopy with laser and stent removal and replacement. She was admitted due to fever postop and hypotension. She was admitted for a period of over 1 week and was seen by urology with right ureteral stent removed just yesterday. She was noted to have poor oral intake for which she was started on Marinol and Remeron. Her appetite gradually increased. During the course of her admission here she was also noted to have fungemia for which she was started on fluconazole therapy by infectious disease who is also following her care. On account of the fungemia, she also had an echocardiogram with no vegetations noted and an ophthalmology consultation with no findings on retinal examination. She did have some right hand pain noted this morning and it appears that she had slept on this overnight, an x-ray was performed demonstrating  no acute abnormalities. She is noted to have an increased appetite this morning, and her son is at the bedside feeding her breakfast and states that for the first time she is actually starting to eat more of her meals. No other acute events have been noted during the course of this admission and she will be discharged to skilled nursing facility with plans for follow-up with ID physician Dr. Linus Salmons in the next 7-10 days as well as with further follow-ups scheduled with urology and ophthalmology in the next 2 weeks.  Discharge Diagnoses:  Principal Problem:   Right ureteral calculus Active Problems:   H/O cystoscopy   Leukocytosis   Hypotension   Anemia   Hypocalcemia   Vitamin D deficiency   Sepsis due to Candida (HCC)   Fungemia: Candida   #1 right ureteral calculus status post cystoscopy/sepsis secondary to fungemia anemia/hypotension -  Patient status post recent admission to [facility] with 8 mm R UP J calculus. Status post right ureteral stent placed 10/02/2016, with CYSTOSCOPY/URETEROSCOPY/HOLMIUM LASER/STENT EXCHANGE 10/19/2016, treated with appropriate antibiotics and now antibiotics have been stopped. Seen by ID. Has candidemia for which she had ophthalmology evaluation as well. Currently on fluconazole which per ID she has to continue 400 mg oral daily with a stop date 2 weeks from the date of Stent removal. Kindly arrange for outpatient ID follow-up with Dr. Linus Salmons 7-10 days after surgery.  #2 Candida albicans fungemia  Patient with 2/2 blood cultures positive for Candida albicans the cystoscopy with stent placement 10/19/2016. Patient has been seen in consultation by ID, IV Rocephin discontinued, patient placed on  fluconazole, 2-D echo done showing no evidence of vegetation, repeat blood cultures so far negative,  ophthalmology also saw the patient with a negative eye exam. Per ID can be switched to oral fluconazole at the time of discharge with a finish date s above.  #3 Shortness of  breath - acute on chronic diastolic CHF EF 07%, resolved after Lasix.   #4 vitamin D deficiency  Patient with vitamin D 25-hydroxy level at 26.1. We'll place on oral vitamin D supplementation with calcium, she currently does not want to take it we can clearly skip it if she wants to for for a few days thereafter continue.  #5 vitamin B 12 deficiency  Continue vitamin B-12 supplementation.  #6 iron deficiency anemia  H&H stable. Fe supplementation appropriate with f/u to PCP.  #7 hypocalcemia  Corrected calcium 8.8 which is borderline. Vitamin D 25-hydroxy level at 26.1. Patient be started on vitamin D supplementation and calcium. Outpatient follow-up with PCP post DC.  #8. Chronic back and bilateral peripheral neuropathy related foot pain. Stable. Supportive care. These are chronic.  #9. Chronic intermittent dysphagia. This also is a chronic issue for patient going on for several years, she has been cleared by speech for regular diet.  #10. Poor appetite-improved. Placed on Marinol and Remeron. Monitor. Encouraged to increase activity, appetite has improved on discharge Remeron and Marinol to be continued.  Discharge Instructions  Discharge Instructions    Diet - low sodium heart healthy    Complete by:  As directed    Increase activity slowly    Complete by:  As directed      Allergies as of 10/27/2016   No Known Allergies     Medication List    TAKE these medications   acetaminophen 325 MG tablet Commonly known as:  TYLENOL Take 325 mg by mouth every 6 (six) hours as needed for moderate pain or headache.   bisacodyl 5 MG EC tablet Commonly known as:  DULCOLAX Take 1 tablet (5 mg total) by mouth daily as needed for moderate constipation.   dronabinol 2.5 MG capsule Commonly known as:  MARINOL Take 1 capsule (2.5 mg total) by mouth 2 (two) times daily before lunch and supper.   feeding supplement (ENSURE ENLIVE) Liqd Take 237 mLs by mouth 3 (three) times daily  between meals.   fluconazole 40 MG/ML suspension Commonly known as:  DIFLUCAN Take 10 mLs (400 mg total) by mouth daily.   latanoprost 0.005 % ophthalmic solution Commonly known as:  XALATAN PLACE 1 DROP INTO BOTH EYES ONCE DAILY   loperamide 2 MG capsule Commonly known as:  IMODIUM Take 2 mg by mouth as needed for diarrhea or loose stools.   LORazepam 0.5 MG tablet Commonly known as:  ATIVAN Take 0.5 mg by mouth 2 (two) times daily as needed for anxiety.   mirtazapine 15 MG disintegrating tablet Commonly known as:  REMERON SOL-TAB Take 1 tablet (15 mg total) by mouth at bedtime.   naproxen sodium 220 MG tablet Commonly known as:  ANAPROX Take 220 mg by mouth as needed.   polyethylene glycol packet Commonly known as:  MIRALAX / GLYCOLAX Take 17 g by mouth daily as needed for mild constipation.   PREVIDENT 5000 BOOSTER PLUS 1.1 % Pste Generic drug:  Sodium Fluoride Use once daily as directed   senna-docusate 8.6-50 MG tablet Commonly known as:  Senokot-S Take 1 tablet by mouth at bedtime as needed for mild constipation.   terazosin 1 MG capsule Commonly known as:  HYTRIN Take 1 capsule (1 mg total) by mouth at bedtime.   traMADol  50 MG tablet Commonly known as:  ULTRAM Take 50-100 mg by mouth every 6 (six) hours as needed for moderate pain or severe pain.      Follow-up Information    Opthamology, [place]. Schedule an appointment as soon as possible for a visit in 1 week(s).   Specialty:  Ophthalmology Contact information: [place] [address] [phone]        Irine Seal, MD Follow up on 11/12/2016.   Specialty:  Urology Why:  145 Contact information: [address] [place] [address] [phone]        Lavone Orn, MD Follow up in 2 week(s).   Specialty:  Internal Medicine Why:  f/u regarding appetite on marinol and remeron Contact information: [address] [place]  [address] [phone]        Thayer Headings, MD Follow up in 7 day(s).   Specialty:  Infectious Diseases Why:  fungemia Contact information: [address] [place] [address] [phone]          No Known Allergies  Consultations:  Urology Dr. [name]  ID Dr. Johnnye Sima  Opthalmology    Procedures/Studies: Ct Abdomen Pelvis Wo Contrast  Result Date: 10/01/2016 CLINICAL DATA:  Acute onset right abdominal pain at 6 a.m. this morning. The pain now radiates into the lower back. Constipation for 1 day. EXAM: CT ABDOMEN AND PELVIS WITHOUT CONTRAST TECHNIQUE: Multidetector CT imaging of the abdomen and pelvis was performed following the standard protocol without IV contrast. COMPARISON:  10/19/2013. FINDINGS: Lower chest: Again demonstrated is a large hiatal hernia containing fat and stomach. 5 mm left lower lobe nodule on image number 9 of series 4 without significant change. Additional previously demonstrated bilateral lung nodules are not currently included. Hepatobiliary: 2 adjacent 4 mm gallstones in the dependent portion of the gallbladder or single 6 mm stone. No gallbladder wall thickening or pericholecystic fluid. Unremarkable liver. Pancreas: Unremarkable. No pancreatic ductal dilatation or surrounding inflammatory changes. Spleen: Normal in size without focal abnormality. Adrenals/Urinary Tract: Moderate dilatation of the right renal collecting system to the level of an 8 mm UPJ calculus. Associated diffuse enlargement of the right kidney with right perinephric soft tissue stranding. Moderately atrophied left kidney. No additional urinary tract calculi or hydronephrosis. Normal appearing adrenal glands. Stomach/Bowel: Large number of colonic diverticula without evidence diverticulitis. No evidence of appendicitis. Unremarkable small bowel. Vascular/Lymphatic: Atheromatous arterial calcifications without aneurysm. No enlarged lymph nodes. Reproductive: Status post  hysterectomy. No adnexal masses. Other: No abdominal wall hernia or abnormality. No abdominopelvic ascites. Musculoskeletal: Right hip prosthesis. Lumbar and lower thoracic spine degenerative changes. These include marked disc space narrowing and vacuum phenomenon at the L2-3 and L4-5 levels and moderate disc space narrowing at the L5-S1 level. There are also facet degenerative changes with associated grade 1 retrolisthesis at the L2-3 and L3-4 levels and grade 1 anterolisthesis at the L4-5 and L5-S1 levels. IMPRESSION: 1. 8 mm right UPJ calculus causing moderate right hydronephrosis. 2. Extensive colonic diverticulosis. 3. Cholelithiasis. 4. Moderate left renal atrophy. Electronically Signed   By: Claudie Revering M.D.   On: 10/01/2016 21:01   Dg Chest 2 View  Result Date: 10/22/2016 CLINICAL DATA:  Shortness of breath.  Fatigue. EXAM: CHEST  2 VIEW COMPARISON:  10/20/2016 . FINDINGS: Cardiomegaly with pulmonary vascular prominence and bilateral interstitial prominence bilateral pleural effusions. Findings consistent CHF. Prominence sliding hiatal hernia. No pneumothorax. IMPRESSION: 1. Congestive heart failure with bilateral pulmonary interstitial edema  and bilateral pleural effusions. 2. Sliding hiatal hernia. Electronically Signed   By: Marcello Moores  Register   On: 10/22/2016 15:58   Dg Abd 1 View  Result Date: 10/03/2016 CLINICAL DATA:  History of recent ureteral stent placement EXAM: ABDOMEN - 1 VIEW COMPARISON:  10/01/2016 FINDINGS: Scattered large and small bowel gas is noted. A right ureteral stent is noted. The known previously seen right UPJ stone is not as well appreciated on this exam due to overlying bowel gas. Right hip replacement is noted. Degenerative changes of the lumbar spine are seen. IMPRESSION: Right ureteral stent in satisfactory position. Electronically Signed   By: Inez Catalina M.D.   On: 10/03/2016 11:19   Dg Chest Port 1 View  Result Date: 10/20/2016 CLINICAL DATA:  Hypoxia. EXAM:  PORTABLE CHEST 1 VIEW COMPARISON:  06/19/2015. FINDINGS: Trachea is midline. Heart size stable. Thoracic aorta is calcified. There may be new right hilar prominence although the patient is rotated. Linear scarring in the left upper lobe. Lungs are otherwise clear. No pleural fluid. Large hiatal hernia. IMPRESSION: 1. Right hilar prominence may be due to rotation. Difficult to exclude adenopathy or mass. Consider follow-up PA and lateral views of the chest in further initial evaluation. 2. Otherwise, no acute findings in the chest. 3. Large hiatal hernia. Electronically Signed   By: Lorin Picket M.D.   On: 10/20/2016 14:20   Dg Hand Complete Right  Result Date: 10/27/2016 CLINICAL DATA:  Right hand pain. EXAM: RIGHT HAND - COMPLETE 3+ VIEW COMPARISON:  None. FINDINGS: Generalized osteopenia. No fracture or dislocation. Moderate osteoarthritis of the scaphotrapeziotrapezoid joint and first [place] joint. Mild osteoarthritis of the first IP joint. Mild joint space narrowing of the second and third MCP joints. Chondrocalcinosis of the TFCC. These findings can be seen in the setting of CPPD. No other soft tissue abnormality. IMPRESSION: 1.  No acute osseous injury of the right hand. 2. Arthropathy of the right hand as described above. Electronically Signed   By: Kathreen Devoid   On: 10/27/2016 10:37   Dg C-arm 1-60 Min-no Report  Result Date: 10/19/2016 Fluoroscopy was utilized by the requesting physician.  No radiographic interpretation.   Dg C-arm 1-60 Min-no Report  Result Date: 10/02/2016 Fluoroscopy was utilized by the requesting physician.  No radiographic interpretation.    Discharge Exam: Vitals:   10/26/16 2112 10/27/16 0500  BP: (!) 114/48 (!) 111/56  Pulse: 92 92  Resp: 18 18  Temp: 99.2 F (37.3 C) 99.1 F (37.3 C)  SpO2: 99% 98%   Vitals:   10/26/16 1550 10/26/16 1700 10/26/16 2112 10/27/16 0500  BP: 135/74 132/70 (!) 114/48 (!) 111/56  Pulse: 92 85 92 92  Resp: 17 16 18 18    Temp: 98.5 F (36.9 C) 98.3 F (36.8 C) 99.2 F (37.3 C) 99.1 F (37.3 C)  TempSrc: Oral Oral Oral Oral  SpO2: 97% 95% 99% 98%  Weight:      Height:        General: Pt is alert, awake, not in acute distress on [place] Cardiovascular: RRR, S1/S2 +, no rubs, no gallops Respiratory: CTA bilaterally, no wheezing, no rhonchi Abdominal: Soft, NT, ND, bowel sounds + Extremities: no edema, no cyanosis    The results of significant diagnostics from this hospitalization (including imaging, microbiology, ancillary and laboratory) are listed below for reference.     Microbiology: Recent Results (from the past 240 hour(s))  Culture, blood (Routine X 2) w Reflex to ID Panel     Status:  Abnormal   Collection Time: 10/19/16 12:33 PM  Result Value Ref Range Status   Specimen Description BLOOD LEFT HAND  Final   Special Requests IN PEDIATRIC BOTTLE Blood Culture adequate volume  Final   Culture  Setup Time   Final    YEAST IN PEDIATRIC BOTTLE CRITICAL VALUE NOTED.  VALUE IS CONSISTENT WITH PREVIOUSLY REPORTED AND CALLED VALUE. Performed at [facility], [place] [address], [place], [place] [address]    Culture CANDIDA ALBICANS (A)  Final   Report Status 10/23/2016 FINAL  Final  Culture, blood (Routine X 2) w Reflex to ID Panel     Status: Abnormal   Collection Time: 10/19/16 12:34 PM  Result Value Ref Range Status   Specimen Description BLOOD RIGHT ARM  Final   Special Requests   Final    BOTTLES DRAWN AEROBIC AND ANAEROBIC Blood Culture adequate volume   Culture  Setup Time   Final    YEAST IN BOTH AEROBIC AND ANAEROBIC BOTTLES CRITICAL RESULT CALLED TO, READ BACK BY AND VERIFIED WITH: A [facility] PHARMD 1903 10/20/16 A BROWNING Performed at [facility], [place] [address] [place], [place] [address]    Culture CANDIDA ALBICANS (A)  Final   Report Status 10/23/2016 FINAL  Final  Blood Culture ID Panel (Reflexed)     Status: Abnormal   Collection Time: 10/19/16 12:34 PM  Result Value  Ref Range Status   Enterococcus species NOT DETECTED NOT DETECTED Final   Listeria monocytogenes NOT DETECTED NOT DETECTED Final   Staphylococcus species NOT DETECTED NOT DETECTED Final   Staphylococcus aureus NOT DETECTED NOT DETECTED Final   Streptococcus species NOT DETECTED NOT DETECTED Final   Streptococcus agalactiae NOT DETECTED NOT DETECTED Final   Streptococcus pneumoniae NOT DETECTED NOT DETECTED Final   Streptococcus pyogenes NOT DETECTED NOT DETECTED Final   Acinetobacter baumannii NOT DETECTED NOT DETECTED Final   Enterobacteriaceae species NOT DETECTED NOT DETECTED Final   Enterobacter cloacae complex NOT DETECTED NOT DETECTED Final   Escherichia coli NOT DETECTED NOT DETECTED Final   Klebsiella oxytoca NOT DETECTED NOT DETECTED Final   Klebsiella pneumoniae NOT DETECTED NOT DETECTED Final   Proteus species NOT DETECTED NOT DETECTED Final   Serratia marcescens NOT DETECTED NOT DETECTED Final   Haemophilus influenzae NOT DETECTED NOT DETECTED Final   Neisseria meningitidis NOT DETECTED NOT DETECTED Final   Pseudomonas aeruginosa NOT DETECTED NOT DETECTED Final   Candida albicans DETECTED (A) NOT DETECTED Final    Comment: CRITICAL RESULT CALLED TO, READ BACK BY AND VERIFIED WITH: A [facility] PHARMD 1903 10/20/16 A BROWNING    Candida glabrata NOT DETECTED NOT DETECTED Final   Candida krusei NOT DETECTED NOT DETECTED Final   Candida parapsilosis NOT DETECTED NOT DETECTED Final   Candida tropicalis NOT DETECTED NOT DETECTED Final    Comment: Performed at [facility], [address] [place], [place] [address]  MRSA PCR Screening     Status: None   Collection Time: 10/19/16  1:56 PM  Result Value Ref Range Status   MRSA by PCR NEGATIVE NEGATIVE Final    Comment:        The GeneXpert MRSA Assay (FDA approved for NASAL specimens only), is one component of a comprehensive MRSA colonization surveillance program. It is not intended to diagnose MRSA infection nor to  guide or monitor treatment for MRSA infections.   Culture, Urine     Status: Abnormal   Collection Time: 10/20/16  2:05 PM  Result Value Ref Range Status   Specimen Description URINE, CLEAN CATCH  Final   Special Requests NONE  Final   Culture MULTIPLE SPECIES PRESENT, SUGGEST RECOLLECTION (A)  Final   Report Status 10/21/2016 FINAL  Final  Culture, blood (routine x 2)     Status: None   Collection Time: 10/21/16 12:29 PM  Result Value Ref Range Status   Specimen Description BLOOD RIGHT ARM  Final   Special Requests   Final    BOTTLES DRAWN AEROBIC AND ANAEROBIC Blood Culture adequate volume   Culture   Final    NO GROWTH 5 DAYS Performed at [facility], [address], [place], [place] [address]    Report Status 10/26/2016 FINAL  Final  Culture, blood (routine x 2)     Status: Abnormal   Collection Time: 10/21/16 12:29 PM  Result Value Ref Range Status   Specimen Description BLOOD RIGHT HAND  Final   Special Requests IN PEDIATRIC BOTTLE Blood Culture adequate volume  Final   Culture  Setup Time   Final    YEAST WITH PSEUDOHYPHAE IN PEDIATRIC BOTTLE CRITICAL RESULT CALLED TO, READ BACK BY AND VERIFIED WITH: J LEGGE,PHARMD AT 1033 10/24/16 BY L BENFIELD Performed at [facility], [place] [address], [place], [place] [address]    Culture CANDIDA ALBICANS (A)  Final   Report Status 10/26/2016 FINAL  Final  Culture, blood (Routine X 2) w Reflex to ID Panel     Status: None (Preliminary result)   Collection Time: 10/24/16 12:17 PM  Result Value Ref Range Status   Specimen Description BLOOD LEFT ANTECUBITAL  Final   Special Requests   Final    BOTTLES DRAWN AEROBIC AND ANAEROBIC Blood Culture adequate volume   Culture   Final    NO GROWTH 2 DAYS Performed at [facility], [place] [address], [place], [place] [address]    Report Status PENDING  Incomplete  Culture, blood (Routine X 2) w Reflex to ID Panel     Status: None (Preliminary result)   Collection Time:  10/24/16 12:18 PM  Result Value Ref Range Status   Specimen Description BLOOD RIGHT HAND  Final   Special Requests   Final    BOTTLES DRAWN AEROBIC AND ANAEROBIC Blood Culture adequate volume   Culture   Final    NO GROWTH 2 DAYS Performed at [facility], [place] [address], [place], [place] [address]    Report Status PENDING  Incomplete     Labs: BNP (last 3 results) No results for input(s): BNP in the last 8760 hours. Basic Metabolic Panel:  Recent Labs Lab 10/21/16 0334 10/22/16 0430 10/23/16 0423 10/24/16 0446 10/26/16 0436  NA 136 137 139 140 136  K 3.7 3.6 3.0* 3.9 4.1  CL 105 103 103 105 100*  CO2 24 24 28 29 29   GLUCOSE 84 65 84 79 118*  BUN 10 10 10 8 10   CREATININE 1.06* 0.92 0.81 0.82 0.88  CALCIUM 7.5* 7.6* 7.8* 8.1* 8.1*  MG  --   --   --  1.7  --    Liver Function Tests:  Recent Labs Lab 10/20/16 1330  ALBUMIN 2.3*   No results for input(s): LIPASE, AMYLASE in the last 168 hours. No results for input(s): AMMONIA in the last 168 hours. CBC:  Recent Labs Lab 10/21/16 0334 10/22/16 0430 10/23/16 0423 10/26/16 0436  WBC 12.2* 10.0 7.3 8.6  HGB 10.3* 11.0* 10.4* 10.6*  HCT 31.9* 34.1* 31.8* 32.6*  MCV 94.4 93.2 91.6 91.8  PLT 210 222 247 321   Cardiac Enzymes: No results for input(s): CKTOTAL, CKMB, CKMBINDEX, TROPONINI in the last 168 hours. BNP: Invalid input(s): POCBNP CBG: No results for input(s): GLUCAP in the last 168 hours. D-Dimer No results for input(s): DDIMER in the last 72 hours. Hgb A1c No results for input(s): HGBA1C in the last 72 hours. Lipid Profile No results for input(s): CHOL, HDL, LDLCALC, TRIG, CHOLHDL, LDLDIRECT in the last 72 hours. Thyroid function studies No results for input(s): TSH, T4TOTAL, T3FREE, THYROIDAB in the last 72 hours.  Invalid input(s): FREET3 Anemia work up No results for input(s): VITAMINB12, FOLATE, FERRITIN, TIBC, IRON, RETICCTPCT in the last 72 hours. Urinalysis    Component Value  Date/Time   COLORURINE YELLOW 10/20/2016 1405   APPEARANCEUR CLOUDY (A) 10/20/2016 1405   LABSPEC 1.011 10/20/2016 1405   PHURINE 5.0 10/20/2016 1405   GLUCOSEU NEGATIVE 10/20/2016 1405   HGBUR LARGE (A) 10/20/2016 1405   BILIRUBINUR NEGATIVE 10/20/2016 1405   KETONESUR NEGATIVE 10/20/2016 1405   PROTEINUR 100 (A) 10/20/2016 1405   NITRITE NEGATIVE 10/20/2016 1405   LEUKOCYTESUR LARGE (A) 10/20/2016 1405   Sepsis Labs Invalid input(s): PROCALCITONIN,  WBC,  LACTICIDVEN Microbiology Recent Results (from the past 240 hour(s))  Culture, blood (Routine X 2) w Reflex to ID Panel     Status: Abnormal   Collection Time: 10/19/16 12:33 PM  Result Value Ref Range Status   Specimen Description BLOOD LEFT HAND  Final   Special Requests IN PEDIATRIC BOTTLE Blood Culture adequate volume  Final   Culture  Setup Time   Final    YEAST IN PEDIATRIC BOTTLE CRITICAL VALUE NOTED.  VALUE IS CONSISTENT WITH PREVIOUSLY REPORTED AND CALLED VALUE. Performed at [facility], [place] [address] [place], [place] [address]    Culture CANDIDA ALBICANS (A)  Final   Report Status 10/23/2016 FINAL  Final  Culture, blood (Routine X 2) w Reflex to ID Panel     Status: Abnormal   Collection Time: 10/19/16 12:34 PM  Result Value Ref Range Status   Specimen Description BLOOD RIGHT ARM  Final   Special Requests   Final    BOTTLES DRAWN AEROBIC AND ANAEROBIC Blood Culture adequate volume   Culture  Setup Time   Final    YEAST IN BOTH AEROBIC AND ANAEROBIC BOTTLES CRITICAL RESULT CALLED TO, READ BACK BY AND VERIFIED WITH: A [facility] PHARMD 1903 10/20/16 A BROWNING Performed at [facility], [place] [address] [place], [place] [address]    Culture CANDIDA ALBICANS (A)  Final   Report Status 10/23/2016 FINAL  Final  Blood Culture ID Panel (Reflexed)     Status: Abnormal   Collection Time: 10/19/16 12:34 PM  Result Value Ref Range Status   Enterococcus species NOT DETECTED NOT DETECTED Final   Listeria  monocytogenes NOT DETECTED NOT DETECTED Final   Staphylococcus species NOT DETECTED NOT DETECTED Final   Staphylococcus aureus NOT DETECTED NOT DETECTED Final   Streptococcus species NOT DETECTED NOT DETECTED Final   Streptococcus agalactiae NOT DETECTED NOT DETECTED Final   Streptococcus pneumoniae NOT DETECTED NOT DETECTED Final   Streptococcus pyogenes NOT DETECTED NOT DETECTED Final   Acinetobacter baumannii NOT DETECTED NOT DETECTED Final   Enterobacteriaceae species NOT DETECTED NOT DETECTED Final   Enterobacter cloacae complex NOT DETECTED NOT DETECTED Final   Escherichia coli NOT DETECTED NOT DETECTED Final   Klebsiella oxytoca NOT DETECTED  NOT DETECTED Final   Klebsiella pneumoniae NOT DETECTED NOT DETECTED Final   Proteus species NOT DETECTED NOT DETECTED Final   Serratia marcescens NOT DETECTED NOT DETECTED Final   Haemophilus influenzae NOT DETECTED NOT DETECTED Final   Neisseria meningitidis NOT DETECTED NOT DETECTED Final   Pseudomonas aeruginosa NOT DETECTED NOT DETECTED Final   Candida albicans DETECTED (A) NOT DETECTED Final    Comment: CRITICAL RESULT CALLED TO, READ BACK BY AND VERIFIED WITH: A PHAM PHARMD 1903 10/20/16 A BROWNING    Candida glabrata NOT DETECTED NOT DETECTED Final   Candida krusei NOT DETECTED NOT DETECTED Final   Candida parapsilosis NOT DETECTED NOT DETECTED Final   Candida tropicalis NOT DETECTED NOT DETECTED Final    Comment: Performed at [facility], [place] [address] [place], [place] [address]  MRSA PCR Screening     Status: None   Collection Time: 10/19/16  1:56 PM  Result Value Ref Range Status   MRSA by PCR NEGATIVE NEGATIVE Final    Comment:        The GeneXpert MRSA Assay (FDA approved for NASAL specimens only), is one component of a comprehensive MRSA colonization surveillance program. It is not intended to diagnose MRSA infection nor to guide or monitor treatment for MRSA infections.   Culture, Urine     Status: Abnormal    Collection Time: 10/20/16  2:05 PM  Result Value Ref Range Status   Specimen Description URINE, CLEAN CATCH  Final   Special Requests NONE  Final   Culture MULTIPLE SPECIES PRESENT, SUGGEST RECOLLECTION (A)  Final   Report Status 10/21/2016 FINAL  Final  Culture, blood (routine x 2)     Status: None   Collection Time: 10/21/16 12:29 PM  Result Value Ref Range Status   Specimen Description BLOOD RIGHT ARM  Final   Special Requests   Final    BOTTLES DRAWN AEROBIC AND ANAEROBIC Blood Culture adequate volume   Culture   Final    NO GROWTH 5 DAYS Performed at [facility], [address], [place], [place] [address]    Report Status 10/26/2016 FINAL  Final  Culture, blood (routine x 2)     Status: Abnormal   Collection Time: 10/21/16 12:29 PM  Result Value Ref Range Status   Specimen Description BLOOD RIGHT HAND  Final   Special Requests IN PEDIATRIC BOTTLE Blood Culture adequate volume  Final   Culture  Setup Time   Final    YEAST WITH PSEUDOHYPHAE IN PEDIATRIC BOTTLE CRITICAL RESULT CALLED TO, READ BACK BY AND VERIFIED WITH: J LEGGE,PHARMD AT 1033 10/24/16 BY L BENFIELD Performed at [facility], [place] [address], [place], [place] [address]    Culture CANDIDA ALBICANS (A)  Final   Report Status 10/26/2016 FINAL  Final  Culture, blood (Routine X 2) w Reflex to ID Panel     Status: None (Preliminary result)   Collection Time: 10/24/16 12:17 PM  Result Value Ref Range Status   Specimen Description BLOOD LEFT ANTECUBITAL  Final   Special Requests   Final    BOTTLES DRAWN AEROBIC AND ANAEROBIC Blood Culture adequate volume   Culture   Final    NO GROWTH 2 DAYS Performed at [facility], [place] [address], [place], [place] [address]    Report Status PENDING  Incomplete  Culture, blood (Routine X 2) w Reflex to ID Panel     Status: None (Preliminary result)   Collection Time: 10/24/16  12:18 PM  Result Value Ref Range Status   Specimen Description BLOOD RIGHT HAND   Final   Special Requests   Final    BOTTLES DRAWN AEROBIC AND ANAEROBIC Blood Culture adequate volume   Culture   Final    NO GROWTH 2 DAYS Performed at [facility], [address] [place], [place] [address]    Report Status PENDING  Incomplete     Time coordinating discharge: Over 30 minutes  SIGNED:   Rodena Goldmann, DO  [facility] 10/27/2016, 11:28 AM Pager [phone]  If 7PM-7AM, please contact night-coverage [email or web address] Password [email or web address]

## 2016-10-27 NOTE — Clinical Social Work Placement (Signed)
Patient received and accepted bed offer at [facility]. Facility aware of patient's discharge and confirmed bed offer. PTAR contacted, family notified. Patient's RN can call report to [phone], packet complete. CSW signing off, no other needs identified.   CLINICAL SOCIAL WORK PLACEMENT  NOTE  Date:  10/27/2016  Patient Details  Name: Caroline Griffith MRN: [id number] Date of Birth: 08/30/1930  Clinical Social Work is seeking post-discharge placement for this patient at the [place] level of care (*CSW will initial, date and re-position this form in  chart as items are completed):  Yes   Patient/family provided with [place] Work Department's list of facilities offering this level of care within the geographic area requested by the patient (or if unable, by the patient's family).  Yes   Patient/family informed of their freedom to choose among providers that offer the needed level of care, that participate in Medicare, Medicaid or managed care program needed by the patient, have an available bed and are willing to accept the patient.  Yes   Patient/family informed of 's ownership interest in [facility] and [facility], as well as of the fact that they are under no obligation to receive care at these facilities.  PASRR submitted to EDS on       PASRR number received on       Existing PASRR number confirmed on 10/24/16     FL2 transmitted to all facilities in geographic area requested by pt/family on 10/24/16     FL2 transmitted to all facilities within larger geographic area on       Patient informed that his/her managed care company has contracts with or will negotiate with certain facilities, including the following:        Yes   Patient/family informed of bed offers received.  Patient chooses bed at [facility]     Physician recommends and patient chooses bed at      Patient to be transferred to [facility] on  10/27/16.  Patient to be transferred to facility by PTAR     Patient family notified on 10/27/16 of transfer.  Name of family member notified:  Crist Fat     PHYSICIAN       Additional Comment:    _______________________________________________ Burnis Medin, LCSW 10/27/2016, 1:53 PM

## 2016-10-28 DIAGNOSIS — B379 Candidiasis, unspecified: Secondary | ICD-10-CM | POA: Diagnosis not present

## 2016-10-28 DIAGNOSIS — I509 Heart failure, unspecified: Secondary | ICD-10-CM | POA: Diagnosis not present

## 2016-10-28 DIAGNOSIS — R1310 Dysphagia, unspecified: Secondary | ICD-10-CM | POA: Diagnosis not present

## 2016-10-28 DIAGNOSIS — N201 Calculus of ureter: Secondary | ICD-10-CM | POA: Diagnosis not present

## 2016-10-29 LAB — CULTURE, BLOOD (ROUTINE X 2)
CULTURE: NO GROWTH
Culture: NO GROWTH
SPECIAL REQUESTS: ADEQUATE
Special Requests: ADEQUATE

## 2016-11-01 ENCOUNTER — Ambulatory Visit (INDEPENDENT_AMBULATORY_CARE_PROVIDER_SITE_OTHER): Payer: PPO | Admitting: Podiatry

## 2016-11-01 ENCOUNTER — Encounter: Payer: Self-pay | Admitting: Podiatry

## 2016-11-01 DIAGNOSIS — L989 Disorder of the skin and subcutaneous tissue, unspecified: Secondary | ICD-10-CM | POA: Diagnosis not present

## 2016-11-03 ENCOUNTER — Inpatient Hospital Stay (HOSPITAL_COMMUNITY): Payer: PPO

## 2016-11-03 ENCOUNTER — Emergency Department (HOSPITAL_COMMUNITY): Payer: PPO

## 2016-11-03 ENCOUNTER — Inpatient Hospital Stay (HOSPITAL_COMMUNITY)
Admission: EM | Admit: 2016-11-03 | Discharge: 2016-11-08 | DRG: 689 | Disposition: A | Discharge status: Home / Self Care | Payer: PPO | Attending: Nephrology | Admitting: Nephrology

## 2016-11-03 ENCOUNTER — Encounter (HOSPITAL_COMMUNITY): Payer: Self-pay | Admitting: Emergency Medicine

## 2016-11-03 DIAGNOSIS — Z87442 Personal history of urinary calculi: Secondary | ICD-10-CM | POA: Diagnosis not present

## 2016-11-03 DIAGNOSIS — R404 Transient alteration of awareness: Secondary | ICD-10-CM | POA: Diagnosis not present

## 2016-11-03 DIAGNOSIS — E8809 Other disorders of plasma-protein metabolism, not elsewhere classified: Secondary | ICD-10-CM

## 2016-11-03 DIAGNOSIS — R4189 Other symptoms and signs involving cognitive functions and awareness: Secondary | ICD-10-CM | POA: Diagnosis not present

## 2016-11-03 DIAGNOSIS — R9431 Abnormal electrocardiogram [ECG] [EKG]: Secondary | ICD-10-CM | POA: Diagnosis not present

## 2016-11-03 DIAGNOSIS — J189 Pneumonia, unspecified organism: Secondary | ICD-10-CM | POA: Diagnosis not present

## 2016-11-03 DIAGNOSIS — A419 Sepsis, unspecified organism: Secondary | ICD-10-CM | POA: Diagnosis not present

## 2016-11-03 DIAGNOSIS — B49 Unspecified mycosis: Secondary | ICD-10-CM | POA: Diagnosis not present

## 2016-11-03 DIAGNOSIS — J9811 Atelectasis: Secondary | ICD-10-CM | POA: Diagnosis present

## 2016-11-03 DIAGNOSIS — R5381 Other malaise: Secondary | ICD-10-CM | POA: Diagnosis present

## 2016-11-03 DIAGNOSIS — N189 Chronic kidney disease, unspecified: Secondary | ICD-10-CM | POA: Diagnosis not present

## 2016-11-03 DIAGNOSIS — Z90710 Acquired absence of both cervix and uterus: Secondary | ICD-10-CM | POA: Diagnosis not present

## 2016-11-03 DIAGNOSIS — Z96651 Presence of right artificial knee joint: Secondary | ICD-10-CM | POA: Diagnosis present

## 2016-11-03 DIAGNOSIS — N390 Urinary tract infection, site not specified: Secondary | ICD-10-CM | POA: Diagnosis not present

## 2016-11-03 DIAGNOSIS — G9341 Metabolic encephalopathy: Secondary | ICD-10-CM | POA: Diagnosis not present

## 2016-11-03 DIAGNOSIS — I4581 Long QT syndrome: Secondary | ICD-10-CM | POA: Diagnosis not present

## 2016-11-03 DIAGNOSIS — R1310 Dysphagia, unspecified: Secondary | ICD-10-CM | POA: Diagnosis present

## 2016-11-03 DIAGNOSIS — R531 Weakness: Secondary | ICD-10-CM | POA: Diagnosis not present

## 2016-11-03 DIAGNOSIS — Z66 Do not resuscitate: Secondary | ICD-10-CM | POA: Diagnosis not present

## 2016-11-03 DIAGNOSIS — Z96641 Presence of right artificial hip joint: Secondary | ICD-10-CM | POA: Diagnosis not present

## 2016-11-03 DIAGNOSIS — G9340 Encephalopathy, unspecified: Secondary | ICD-10-CM | POA: Diagnosis present

## 2016-11-03 DIAGNOSIS — R4020 Unspecified coma: Secondary | ICD-10-CM | POA: Diagnosis not present

## 2016-11-03 DIAGNOSIS — H3530 Unspecified macular degeneration: Secondary | ICD-10-CM | POA: Diagnosis not present

## 2016-11-03 DIAGNOSIS — Z7401 Bed confinement status: Secondary | ICD-10-CM

## 2016-11-03 DIAGNOSIS — Z8249 Family history of ischemic heart disease and other diseases of the circulatory system: Secondary | ICD-10-CM | POA: Diagnosis not present

## 2016-11-03 DIAGNOSIS — E860 Dehydration: Secondary | ICD-10-CM | POA: Diagnosis not present

## 2016-11-03 DIAGNOSIS — Z7951 Long term (current) use of inhaled steroids: Secondary | ICD-10-CM

## 2016-11-03 DIAGNOSIS — Z87891 Personal history of nicotine dependence: Secondary | ICD-10-CM

## 2016-11-03 DIAGNOSIS — E440 Moderate protein-calorie malnutrition: Secondary | ICD-10-CM | POA: Diagnosis present

## 2016-11-03 DIAGNOSIS — N10 Acute tubulo-interstitial nephritis: Principal | ICD-10-CM

## 2016-11-03 DIAGNOSIS — Z6825 Body mass index (BMI) 25.0-25.9, adult: Secondary | ICD-10-CM | POA: Diagnosis not present

## 2016-11-03 DIAGNOSIS — N179 Acute kidney failure, unspecified: Secondary | ICD-10-CM | POA: Diagnosis not present

## 2016-11-03 DIAGNOSIS — Z87440 Personal history of urinary (tract) infections: Secondary | ICD-10-CM

## 2016-11-03 DIAGNOSIS — K8020 Calculus of gallbladder without cholecystitis without obstruction: Secondary | ICD-10-CM | POA: Diagnosis not present

## 2016-11-03 DIAGNOSIS — D649 Anemia, unspecified: Secondary | ICD-10-CM

## 2016-11-03 DIAGNOSIS — R6520 Severe sepsis without septic shock: Secondary | ICD-10-CM | POA: Diagnosis not present

## 2016-11-03 DIAGNOSIS — Y95 Nosocomial condition: Secondary | ICD-10-CM | POA: Diagnosis present

## 2016-11-03 DIAGNOSIS — M19031 Primary osteoarthritis, right wrist: Secondary | ICD-10-CM | POA: Diagnosis not present

## 2016-11-03 DIAGNOSIS — Z825 Family history of asthma and other chronic lower respiratory diseases: Secondary | ICD-10-CM

## 2016-11-03 DIAGNOSIS — B965 Pseudomonas (aeruginosa) (mallei) (pseudomallei) as the cause of diseases classified elsewhere: Secondary | ICD-10-CM | POA: Diagnosis not present

## 2016-11-03 DIAGNOSIS — M2540 Effusion, unspecified joint: Secondary | ICD-10-CM

## 2016-11-03 DIAGNOSIS — R4182 Altered mental status, unspecified: Secondary | ICD-10-CM | POA: Diagnosis not present

## 2016-11-03 HISTORY — DX: Personal history of urinary calculi: Z87.442

## 2016-11-03 HISTORY — DX: Anxiety disorder, unspecified: F41.9

## 2016-11-03 LAB — I-STAT CHEM 8, ED
BUN: 17 mg/dL (ref 6–20)
CALCIUM ION: 1.08 mmol/L — AB (ref 1.15–1.40)
CHLORIDE: 99 mmol/L — AB (ref 101–111)
Creatinine, Ser: 1 mg/dL (ref 0.44–1.00)
Glucose, Bld: 106 mg/dL — ABNORMAL HIGH (ref 65–99)
HCT: 31 % — ABNORMAL LOW (ref 36.0–46.0)
Hemoglobin: 10.5 g/dL — ABNORMAL LOW (ref 12.0–15.0)
Potassium: 3.9 mmol/L (ref 3.5–5.1)
SODIUM: 139 mmol/L (ref 135–145)
TCO2: 29 mmol/L (ref 22–32)

## 2016-11-03 LAB — I-STAT TROPONIN, ED: TROPONIN I, POC: 0.03 ng/mL (ref 0.00–0.08)

## 2016-11-03 LAB — URINALYSIS, ROUTINE W REFLEX MICROSCOPIC
Bilirubin Urine: NEGATIVE
GLUCOSE, UA: NEGATIVE mg/dL
Ketones, ur: NEGATIVE mg/dL
Nitrite: POSITIVE — AB
Protein, ur: 30 mg/dL — AB
SPECIFIC GRAVITY, URINE: 1.016 (ref 1.005–1.030)
pH: 5 (ref 5.0–8.0)

## 2016-11-03 LAB — I-STAT ARTERIAL BLOOD GAS, ED
Acid-Base Excess: 8 mmol/L — ABNORMAL HIGH (ref 0.0–2.0)
Bicarbonate: 32.2 mmol/L — ABNORMAL HIGH (ref 20.0–28.0)
O2 SAT: 80 %
PCO2 ART: 43.8 mmHg (ref 32.0–48.0)
PH ART: 7.475 — AB (ref 7.350–7.450)
PO2 ART: 42 mmHg — AB (ref 83.0–108.0)
TCO2: 34 mmol/L — ABNORMAL HIGH (ref 22–32)

## 2016-11-03 LAB — I-STAT CG4 LACTIC ACID, ED: Lactic Acid, Venous: 1.36 mmol/L (ref 0.5–1.9)

## 2016-11-03 LAB — CBC WITH DIFFERENTIAL/PLATELET
Basophils Absolute: 0.1 10*3/uL (ref 0.0–0.1)
Basophils Relative: 1 %
EOS ABS: 0.2 10*3/uL (ref 0.0–0.7)
EOS PCT: 2 %
HCT: 32.3 % — ABNORMAL LOW (ref 36.0–46.0)
Hemoglobin: 10.1 g/dL — ABNORMAL LOW (ref 12.0–15.0)
LYMPHS ABS: 1.4 10*3/uL (ref 0.7–4.0)
Lymphocytes Relative: 13 %
MCH: 29.1 pg (ref 26.0–34.0)
MCHC: 31.3 g/dL (ref 30.0–36.0)
MCV: 93.1 fL (ref 78.0–100.0)
MONOS PCT: 10 %
Monocytes Absolute: 1 10*3/uL (ref 0.1–1.0)
Neutro Abs: 7.7 10*3/uL (ref 1.7–7.7)
Neutrophils Relative %: 74 %
PLATELETS: 493 10*3/uL — AB (ref 150–400)
RBC: 3.47 MIL/uL — ABNORMAL LOW (ref 3.87–5.11)
RDW: 14.4 % (ref 11.5–15.5)
WBC: 10.3 10*3/uL (ref 4.0–10.5)

## 2016-11-03 LAB — CBG MONITORING, ED: Glucose-Capillary: 103 mg/dL — ABNORMAL HIGH (ref 65–99)

## 2016-11-03 LAB — COMPREHENSIVE METABOLIC PANEL
ALBUMIN: 2.3 g/dL — AB (ref 3.5–5.0)
ALK PHOS: 181 U/L — AB (ref 38–126)
ALT: 22 U/L (ref 14–54)
ANION GAP: 9 (ref 5–15)
AST: 35 U/L (ref 15–41)
BILIRUBIN TOTAL: 1.3 mg/dL — AB (ref 0.3–1.2)
BUN: 15 mg/dL (ref 6–20)
CALCIUM: 8.7 mg/dL — AB (ref 8.9–10.3)
CO2: 30 mmol/L (ref 22–32)
Chloride: 99 mmol/L — ABNORMAL LOW (ref 101–111)
Creatinine, Ser: 1.15 mg/dL — ABNORMAL HIGH (ref 0.44–1.00)
GFR calc Af Amer: 49 mL/min — ABNORMAL LOW (ref >60)
GFR calc non Af Amer: 42 mL/min — ABNORMAL LOW (ref >60)
GLUCOSE: 108 mg/dL — AB (ref 65–99)
Potassium: 3.8 mmol/L (ref 3.5–5.1)
Sodium: 138 mmol/L (ref 135–145)
TOTAL PROTEIN: 6.4 g/dL — AB (ref 6.5–8.1)

## 2016-11-03 LAB — AMMONIA: Ammonia: 22 umol/L (ref 9–35)

## 2016-11-03 LAB — ETHANOL: Alcohol, Ethyl (B): <10 mg/dL (ref <10)

## 2016-11-03 MED ORDER — VANCOMYCIN HCL IN DEXTROSE 750-5 MG/150ML-% IV SOLN
750.0000 mg | Freq: Two times a day (BID) | INTRAVENOUS | Status: DC
  Filled 2016-11-03 (×2): qty 150

## 2016-11-03 MED ORDER — SODIUM CHLORIDE 0.9 % IV SOLN
INTRAVENOUS | Status: AC
  Administered 2016-11-04: 01:00:00 via INTRAVENOUS

## 2016-11-03 MED ORDER — FLUCONAZOLE IN SODIUM CHLORIDE 400-0.9 MG/200ML-% IV SOLN
400.0000 mg | INTRAVENOUS | Status: DC
  Administered 2016-11-04 – 2016-11-08 (×5): 400 mg via INTRAVENOUS
  Filled 2016-11-03 (×5): qty 200

## 2016-11-03 MED ORDER — SODIUM CHLORIDE 0.9 % IV BOLUS (SEPSIS)
1000.0000 mL | Freq: Once | INTRAVENOUS | Status: AC
  Administered 2016-11-03: 1000 mL via INTRAVENOUS

## 2016-11-03 MED ORDER — LATANOPROST 0.005 % OP SOLN
1.0000 [drp] | Freq: Every day | OPHTHALMIC | Status: DC
  Administered 2016-11-04 – 2016-11-07 (×5): 1 [drp] via OPHTHALMIC
  Filled 2016-11-03: qty 2.5

## 2016-11-03 MED ORDER — ACETAMINOPHEN 325 MG PO TABS: 650.0000 mg | ORAL_TABLET | Freq: Four times a day (QID) | ORAL | Status: DC | PRN

## 2016-11-03 MED ORDER — ACETAMINOPHEN 650 MG RE SUPP: 650.0000 mg | Freq: Four times a day (QID) | RECTAL | Status: DC | PRN

## 2016-11-03 MED ORDER — BISACODYL 5 MG PO TBEC: 5.0000 mg | DELAYED_RELEASE_TABLET | Freq: Every day | ORAL | Status: DC | PRN

## 2016-11-03 MED ORDER — ENOXAPARIN SODIUM 30 MG/0.3ML ~~LOC~~ SOLN
30.0000 mg | SUBCUTANEOUS | Status: DC
  Administered 2016-11-04: 30 mg via SUBCUTANEOUS
  Filled 2016-11-03: qty 0.3

## 2016-11-03 MED ORDER — TRAMADOL HCL 50 MG PO TABS: 50.0000 mg | ORAL_TABLET | Freq: Four times a day (QID) | ORAL | Status: DC | PRN

## 2016-11-03 MED ORDER — THIAMINE HCL 100 MG/ML IJ SOLN
100.0000 mg | Freq: Every day | INTRAMUSCULAR | Status: DC
  Administered 2016-11-04 – 2016-11-07 (×4): 100 mg via INTRAVENOUS
  Filled 2016-11-03 (×6): qty 2

## 2016-11-03 MED ORDER — SENNOSIDES-DOCUSATE SODIUM 8.6-50 MG PO TABS: 1.0000 | ORAL_TABLET | Freq: Every evening | ORAL | Status: DC | PRN

## 2016-11-03 MED ORDER — PIPERACILLIN-TAZOBACTAM 3.375 G IVPB 30 MIN
3.3750 g | Freq: Once | INTRAVENOUS | Status: AC
  Administered 2016-11-03: 3.375 g via INTRAVENOUS
  Filled 2016-11-03: qty 50

## 2016-11-03 MED ORDER — VANCOMYCIN HCL 10 G IV SOLR
1250.0000 mg | Freq: Once | INTRAVENOUS | Status: AC
  Administered 2016-11-03: 1250 mg via INTRAVENOUS
  Filled 2016-11-03: qty 1250

## 2016-11-03 MED ORDER — HYDROCODONE-ACETAMINOPHEN 5-325 MG PO TABS: 1.0000 | ORAL_TABLET | ORAL | Status: DC | PRN

## 2016-11-03 MED ORDER — TERAZOSIN HCL 1 MG PO CAPS
1.0000 mg | ORAL_CAPSULE | Freq: Every day | ORAL | Status: DC
  Administered 2016-11-04 – 2016-11-07 (×5): 1 mg via ORAL
  Filled 2016-11-03 (×6): qty 1

## 2016-11-03 MED ORDER — DEXTROSE 5 % IV SOLN
1.0000 g | INTRAVENOUS | Status: DC
  Administered 2016-11-04 – 2016-11-05 (×2): 1 g via INTRAVENOUS
  Filled 2016-11-03 (×2): qty 1

## 2016-11-03 NOTE — Progress Notes (Signed)
Pharmacy Antibiotic Note  Caroline Griffith is a 83 y.o. female admitted on 11/03/2016 with pneumonia.  Pharmacy has been consulted for vancomycin dosing.  Already started on cefepime.  Plan: Vancomycin 1250mg  x1 then 750mg  IV every 12 hours.  Goal trough 15-20 mcg/mL.  Height: 5' 6.5" (168.9 cm) Weight: 160 lb (72.6 kg) IBW/kg (Calculated) : 60.45  Temp (24hrs), Avg:99.4 F (37.4 C), Min:98.5 F (36.9 C), Max:100.5 F (38.1 C)   Recent Labs Lab 11/03/16 1624 11/03/16 1718 11/03/16 1719  WBC 10.3  --   --   CREATININE 1.15* 1.00  --   LATICACIDVEN  --   --  1.36    Estimated Creatinine Clearance: 43.2 mL/min (by C-G formula based on SCr of 1 mg/dL).    No Known Allergies   Thank you for allowing pharmacy to be a part of this patient's care.  Wynona Neat, PharmD, BCPS  11/03/2016 11:00 PM

## 2016-11-03 NOTE — ED Notes (Signed)
On way to CT 

## 2016-11-03 NOTE — ED Notes (Signed)
Unsuccessful attempt at giving report to 2W. 

## 2016-11-03 NOTE — ED Triage Notes (Signed)
Per EMS, patient is coming from [facility] with altered mental status, weakness, tired, lethargy.  Was seen at [facility] x 2 weeks ago for sepsis.  (yeast infection, kidney stones, E.Coli).  Family states patient has become progressively more weak, tired, and ALOC since discharge.  Answers questions appropriately.  VSS. 97.6 temporal, 130/66, 110 HR, RR 20, 95% RA, CBG 146.

## 2016-11-03 NOTE — ED Notes (Signed)
On way to CT and XR 

## 2016-11-03 NOTE — Progress Notes (Signed)
   Subjective: Patient presents to the office today for follow up evaluation of callus lesions of the bilateral feet. Patient states that the calluses are ongoing and are affecting her ability to ambulate without pain. Patient presents today for further treatment and evaluation.   Past Medical History:  Diagnosis Date  . Arthritis   . Chronic kidney disease    kidney stones  . History of hiatal hernia   . Macular degeneration   . Pneumonia    hx. of  . Urolithiasis   . Varicose veins    bilateral LE       Objective:  Physical Exam General: Alert and oriented x3 in no acute distress  Dermatology: Hyperkeratotic lesion present on the bilateral feet 2. Pain on palpation with a central nucleated core noted.  Skin is warm, dry and supple bilateral lower extremities. Negative for open lesions or macerations.  Vascular: Palpable pedal pulses bilaterally. No edema or erythema noted. Capillary refill within normal limits.  Neurological: Epicritic and protective threshold grossly intact bilaterally.   Musculoskeletal Exam: Pain on palpation at the keratotic lesion noted. Range of motion within normal limits bilateral. Muscle strength 5/5 in all groups bilateral.  Assessment: #1 porokeratosis bilateral feet 2   Plan of Care:  #1 Patient evaluated. #2 Patient is currently wheelchair-bound. No excisional debridement performed today.  #3 Recommended good shoe gear. #4 Return to clinic when necessary if calluses become symptomatic.     Edrick Kins, DPM [facility]  Dr. Edrick Kins, [place]                                        [place], [place] [address]                Office [phone]  Fax [phone]

## 2016-11-03 NOTE — ED Provider Notes (Signed)
[facility] EMERGENCY DEPARTMENT Provider Note   CSN: [id number] Arrival date & time: 11/03/16  1555     History   Chief Complaint Chief Complaint  Patient presents with  . Altered Mental Status  . Weakness    HPI Caroline Griffith is a 83 y.o. female.  83 yo F with a cc of confusion.  Had an infected ureteral stone.  Admitted over at [facility] long.  Difficulty with swallowing over the past couple of days.  She also has become increasingly sleepy.  Per the family over the past week or 2 she has not really had any oral intake.  Has also been bedbound.  Denies any unilateral numbness or weakness.  Denies chest pain shortness of breath abdominal pain nausea vomiting or diarrhea.  Has been having an increased cough.  Having subjective fevers and chills as well.  Denies falls.   The history is provided by the patient.  Altered Mental Status   This is a new problem. The current episode started more than 2 days ago. The problem has been gradually worsening. Associated symptoms include confusion and weakness.  Weakness  Primary symptoms include no dizziness. Associated symptoms include altered mental status and confusion. Pertinent negatives include no shortness of breath, no chest pain, no vomiting and no headaches.  Illness  This is a new problem. The current episode started yesterday. The problem occurs constantly. The problem has been gradually worsening. Pertinent negatives include no chest pain, no headaches and no shortness of breath. Nothing aggravates the symptoms. Nothing relieves the symptoms. She has tried nothing for the symptoms. The treatment provided no relief.    Past Medical History:  Diagnosis Date  . Arthritis   . Chronic kidney disease    kidney stones  . History of hiatal hernia   . Macular degeneration   . Pneumonia    hx. of  . Urolithiasis   . Varicose veins    bilateral LE      Patient Active Problem List   Diagnosis Date Noted  .  Vitamin D deficiency 10/22/2016  . Sepsis due to Candida ([place]) 10/22/2016  . Fungemia: Candida 10/22/2016  . Leukocytosis 10/20/2016  . Hypotension 10/20/2016  . Anemia 10/20/2016  . Hypocalcemia 10/20/2016  . Right ureteral calculus 10/19/2016  . H/O cystoscopy 10/19/2016  . Nephrolithiasis 10/01/2016  . Renal insufficiency 10/01/2016  . Lower urinary tract infectious disease   . Stone, kidney 08/15/2014  . Right nephrolithiasis 08/15/2014  . Pulmonary nodules 03/20/2014  . SOB (shortness of breath) 03/19/2014    Past Surgical History:  Procedure Laterality Date  . ABDOMINAL HYSTERECTOMY    . BUNIONECTOMY Right   . CYSTOSCOPY W/ URETERAL STENT REMOVAL Right 10/26/2016   Procedure: CYSTOSCOPY WITH STENT REMOVAL;  Surgeon: Irine Seal, MD;  Location: [facility];  Service: Urology;  Laterality: Right;  . CYSTOSCOPY WITH STENT PLACEMENT Right 08/15/2014   Procedure: CYSTOSCOPY WITH RIGHT STENT PLACEMENT;  Surgeon: [name], MD;  Location: [facility];  Service: Urology;  Laterality: Right;  . CYSTOSCOPY WITH STENT PLACEMENT Right 10/02/2016   Procedure: CYSTOSCOPY, RIGHT RETROGRADE WITH RIGHT URETERAL STENT PLACEMENT;  Surgeon: Irine Seal, MD;  Location: [facility];  Service: Urology;  Laterality: Right;  . CYSTOSCOPY/URETEROSCOPY/HOLMIUM LASER/STENT PLACEMENT Right 10/19/2016   Procedure: CYSTOSCOPY/URETEROSCOPY/HOLMIUM LASER/STENT EXCHANGE;  Surgeon: Irine Seal, MD;  Location: [facility];  Service: Urology;  Laterality: Right;  ONLY NEEDS 60 MINUTES FOR PROCEDURE  . EXTRACORPOREAL SHOCK WAVE LITHOTRIPSY Right 08/18/2014  . JOINT  REPLACEMENT Right    Total hip replacement  . JOINT REPLACEMENT Right    Total knee replacement  . ORIF WRIST FRACTURE Left   . TONSILLECTOMY      OB History    No data available       Home Medications    Prior to Admission medications   Medication Sig Start Date End Date Taking? Authorizing Provider  acetaminophen (TYLENOL) 325 MG tablet Take 325 mg by  mouth every 6 (six) hours as needed for moderate pain or headache.    [provider]  bisacodyl (DULCOLAX) 5 MG EC tablet Take 1 tablet (5 mg total) by mouth daily as needed for moderate constipation. 10/27/16   Manuella Ghazi, Pratik D, DO  dronabinol (MARINOL) 2.5 MG capsule Take 1 capsule (2.5 mg total) by mouth 2 (two) times daily before lunch and supper. 10/27/16   Manuella Ghazi, Pratik D, DO  feeding supplement, ENSURE ENLIVE, (ENSURE ENLIVE) LIQD Take 237 mLs by mouth 3 (three) times daily between meals. 10/24/16   Stasia Cavalier, MD  fluconazole (DIFLUCAN) 40 MG/ML suspension Take 10 mLs (400 mg total) by mouth daily. 10/24/16 11/11/16  Stasia Cavalier, MD  latanoprost (XALATAN) 0.005 % ophthalmic solution PLACE 1 DROP INTO BOTH EYES ONCE DAILY 08/28/16   [provider]  loperamide (IMODIUM) 2 MG capsule Take 2 mg by mouth as needed for diarrhea or loose stools.    [provider]  LORazepam (ATIVAN) 0.5 MG tablet Take 0.5 mg by mouth 2 (two) times daily as needed for anxiety.    [provider]  mirtazapine (REMERON SOL-TAB) 15 MG disintegrating tablet Take 1 tablet (15 mg total) by mouth at bedtime. 10/27/16   Manuella Ghazi, Pratik D, DO  naproxen sodium (ANAPROX) 220 MG tablet Take 220 mg by mouth as needed.    [provider]  polyethylene glycol (MIRALAX / GLYCOLAX) packet Take 17 g by mouth daily as needed for mild constipation. 10/27/16   Manuella Ghazi, Pratik D, DO  PREVIDENT 5000 BOOSTER PLUS 1.1 % PSTE Use once daily as directed 09/01/16   [provider]  senna-docusate (SENOKOT-S) 8.6-50 MG tablet Take 1 tablet by mouth at bedtime as needed for mild constipation. 10/24/16   Stasia Cavalier, MD  terazosin (HYTRIN) 1 MG capsule Take 1 capsule (1 mg total) by mouth at bedtime. 10/04/16   Florencia Reasons, MD  traMADol (ULTRAM) 50 MG tablet Take 1-2 tablets (50-100 mg total) by mouth every 6 (six) hours as needed for moderate pain or severe pain. 10/27/16    Heath Lark D, DO    Family History Family History  Problem Relation Age of Onset  . Heart disease Mother   . Asthma Mother   . Emphysema Mother        smoked  . Heart disease Father     Social History Social History  Substance Use Topics  . Smoking status: Former Smoker    Packs/day: 0.25    Years: 14.00    Types: Cigarettes    Quit date: 01/12/1963  . Smokeless tobacco: Never Used  . Alcohol use 4.2 oz/week    7 Cans of beer per week     Comment: one drink a night     Allergies   Patient has no known allergies.   Review of Systems Review of Systems  Constitutional: Negative for chills and fever.  HENT: Negative for congestion and rhinorrhea.   Eyes: Negative for redness and visual disturbance.  Respiratory: Negative for shortness  of breath and wheezing.   Cardiovascular: Negative for chest pain and palpitations.  Gastrointestinal: Negative for nausea and vomiting.  Genitourinary: Negative for dysuria and urgency.  Musculoskeletal: Negative for arthralgias and myalgias.  Skin: Negative for pallor and wound.  Neurological: Positive for weakness. Negative for dizziness and headaches.  Psychiatric/Behavioral: Positive for confusion.     Physical Exam Updated Vital Signs BP (!) 136/55   Pulse 95   Temp (!) 100.5 F (38.1 C) (Rectal)   Resp (!) 24   Ht 5' 6.5" (1.689 m)   Wt 72.6 kg (160 lb)   SpO2 93%   BMI 25.44 kg/m   Physical Exam  Constitutional: She is oriented to person, place, and time. She appears well-developed and well-nourished. No distress.  HENT:  Head: Normocephalic and atraumatic.  Greenish substance inside the oral cavity  Eyes: Pupils are equal, round, and reactive to light. EOM are normal.  Neck: Normal range of motion. Neck supple.  Cardiovascular: Normal rate and regular rhythm.  Exam reveals no gallop and no friction rub.   No murmur heard. Pulmonary/Chest: Effort normal. She has no wheezes. She has no rales.  Abdominal: Soft.  She exhibits no distension. There is no tenderness.  Musculoskeletal: She exhibits no edema or tenderness.  Neurological: She is alert and oriented to person, place, and time. No cranial nerve deficit or sensory deficit.  Diffusely weak without any unilateral weakness  Skin: Skin is warm and dry. She is not diaphoretic.  Psychiatric: She has a normal mood and affect. Her behavior is normal.  Nursing note and vitals reviewed.    ED Treatments / Results  Labs (all labs ordered are listed, but only abnormal results are displayed) Labs Reviewed  COMPREHENSIVE METABOLIC PANEL - Abnormal; Notable for the following:       Result Value   Chloride 99 (*)    Glucose, Bld 108 (*)    Creatinine, Ser 1.15 (*)    Calcium 8.7 (*)    Total Protein 6.4 (*)    Albumin 2.3 (*)    Alkaline Phosphatase 181 (*)    Total Bilirubin 1.3 (*)    GFR calc non Af Amer 42 (*)    GFR calc Af Amer 49 (*)    All other components within normal limits  CBC WITH DIFFERENTIAL/PLATELET - Abnormal; Notable for the following:    RBC 3.47 (*)    Hemoglobin 10.1 (*)    HCT 32.3 (*)    Platelets 493 (*)    All other components within normal limits  URINALYSIS, ROUTINE W REFLEX MICROSCOPIC - Abnormal; Notable for the following:    Color, Urine AMBER (*)    APPearance HAZY (*)    Hgb urine dipstick SMALL (*)    Protein, ur 30 (*)    Nitrite POSITIVE (*)    Leukocytes, UA LARGE (*)    Bacteria, UA MANY (*)    Squamous Epithelial / LPF 0-5 (*)    All other components within normal limits  CBG MONITORING, ED - Abnormal; Notable for the following:    Glucose-Capillary 103 (*)    All other components within normal limits  I-STAT CHEM 8, ED - Abnormal; Notable for the following:    Chloride 99 (*)    Glucose, Bld 106 (*)    Calcium, Ion 1.08 (*)    Hemoglobin 10.5 (*)    HCT 31.0 (*)    All other components within normal limits  I-STAT ARTERIAL BLOOD GAS, ED - Abnormal; Notable  for the following:    pH, Arterial  7.475 (*)    pO2, Arterial 42.0 (*)    Bicarbonate 32.2 (*)    TCO2 34 (*)    Acid-Base Excess 8.0 (*)    All other components within normal limits  URINE CULTURE  CULTURE, BLOOD (ROUTINE X 2)  CULTURE, BLOOD (ROUTINE X 2)  AMMONIA  ETHANOL  I-STAT CG4 LACTIC ACID, ED  I-STAT VENOUS BLOOD GAS, ED  I-STAT TROPONIN, ED    EKG  EKG Interpretation None       Radiology Dg Chest 2 View  Result Date: 11/03/2016 CLINICAL DATA:  Altered mental status. EXAM: CHEST  2 VIEW COMPARISON:  10/22/2016 and prior radiographs.  10/01/2016 CT FINDINGS: Cardiomediastinal silhouette is unchanged with moderate to large hiatal hernia again noted. This is a low volume film. There is no evidence of focal airspace disease, pulmonary edema, suspicious pulmonary nodule/mass, pleural effusion, or pneumothorax. No acute bony abnormalities are identified. IMPRESSION: No evidence of acute cardiopulmonary disease. Hiatal hernia. Electronically Signed   By: Margarette Canada M.D.   On: 11/03/2016 17:55   Ct Head Wo Contrast  Result Date: 11/03/2016 CLINICAL DATA:  Altered level consciousness. EXAM: CT HEAD WITHOUT CONTRAST TECHNIQUE: Contiguous axial images were obtained from the base of the skull through the vertex without intravenous contrast. COMPARISON:  02/08/2011 FINDINGS: Brain: There is atrophy and chronic small vessel disease changes. No acute intracranial abnormality. Specifically, no hemorrhage, hydrocephalus, mass lesion, acute infarction, or significant intracranial injury. Vascular: No hyperdense vessel or unexpected calcification. Skull: No acute calvarial abnormality. Sinuses/Orbits: Visualized paranasal sinuses and mastoids clear. Orbital soft tissues unremarkable. Other: None IMPRESSION: No acute intracranial abnormality. Atrophy, chronic microvascular disease. Electronically Signed   By: Rolm Baptise M.D.   On: 11/03/2016 17:43    Procedures Procedures (including critical care time)  Medications  Ordered in ED Medications  vancomycin (VANCOCIN) 1,250 mg in sodium chloride 0.9 % 250 mL IVPB (1,250 mg Intravenous New Bag/Given 11/03/16 1758)  sodium chloride 0.9 % bolus 1,000 mL (0 mLs Intravenous Stopped 11/03/16 1815)  piperacillin-tazobactam (ZOSYN) IVPB 3.375 g (0 g Intravenous Stopped 11/03/16 1845)  sodium chloride 0.9 % bolus 1,000 mL (1,000 mLs Intravenous New Bag/Given 11/03/16 1815)     Initial Impression / Assessment and Plan / ED Course  I have reviewed the triage vital signs and the nursing notes.  Pertinent labs & imaging results that were available during my care of the patient were reviewed by me and considered in my medical decision making (see chart for details).     83 yo F with a cc of AMS. Started a couple days ago.  Recently out of the hospital from an infected kidney stone.  Recently difficulty with swallowing, the patient also has been coughing and has been subjectively warm at the nursing home.  We will obtain an altered mental status workup and reassess.  Urine looks grossly infected.  Will start on broad-spectrum antibiotics.  With altered mental status will admit.  The patients results and plan were reviewed and discussed.   Any x-rays performed were independently reviewed by myself.   Differential diagnosis were considered with the presenting HPI.  Medications  vancomycin (VANCOCIN) 1,250 mg in sodium chloride 0.9 % 250 mL IVPB (1,250 mg Intravenous New Bag/Given 11/03/16 1758)  sodium chloride 0.9 % bolus 1,000 mL (0 mLs Intravenous Stopped 11/03/16 1815)  piperacillin-tazobactam (ZOSYN) IVPB 3.375 g (0 g Intravenous Stopped 11/03/16 1845)  sodium chloride 0.9 % bolus 1,000  mL (1,000 mLs Intravenous New Bag/Given 11/03/16 1815)    Vitals:   11/03/16 1800 11/03/16 1815 11/03/16 1830 11/03/16 1845  BP: 137/62 (!) 131/58 125/66 (!) 136/55  Pulse: 100 96 95 95  Resp: (!) 23 18 (!) 23 (!) 24  Temp:      TempSrc:      SpO2: 93% 96% 94% 93%    Weight:      Height:        Final diagnoses:  Acute pyelonephritis    Admission/ observation were discussed with the admitting physician, patient and/or family and they are comfortable with the plan.   Final Clinical Impressions(s) / ED Diagnoses   Final diagnoses:  Acute pyelonephritis    New Prescriptions New Prescriptions   No medications on file     Deno Etienne, DO 11/03/16 2249

## 2016-11-03 NOTE — H&P (Signed)
Caroline Griffith YCX:[id number] DOB: November 22, 1930 DOA: 11/03/2016     PCP: Lavone Orn, MD   Outpatient Specialists: [facility] Patient coming from:    From facility [facility]  Chief Complaint:confusion and generalized fatigue and lethargy  HPI: Caroline Griffith is a 83 y.o. female with medical history significant of kidney stones, macular degeneration, varicose veins, OA,    Presented with progressive confusion and lethargy decreased by mouth intake patient noticed that she's been having some coughing episodes and they're worried if she has a pneumonia that brought her back to emergency department to further evaluate Patient has hardly ate anything for past 1 week being bed bound since her discharge and declined progressively she hasn't endorsed any chest pain or shortness of breath or abdominal pain she may have had some subjective fevers no falls. Reports feeling overall lightheaded no chest pain  extensive history of urological problem history of hydronephrosis and pan sensitive Escherichia coliUTI secondary to UPJ stone 24th of September she was discharged on Keflex. Ninth of October she had uteroscopy with laser stent removal on replacement and postop developed fever and hypotension she spent one week in a hospital in June which urology was consulted and on October 16 stent was removed during hospitalization she was found to have fungemia and started on fluconazole per infectious disease. Echo showed no vegetation ophthalmology consult showed no evidence of retinal exam abnormalities she was discharged on 17th of October to SNF. Patient should be on fluconazole until October 30th.     IN ER:  Temp (24hrs), Avg:99.5 F (37.5 C), Min:98.5 F (36.9 C), Max:100.5 F (38.1 C)      on arrival  ED Triage Vitals  Enc Vitals Group     BP 11/03/16 1555 129/62     Pulse Rate 11/03/16 1555 (!) 106     Resp 11/03/16 1555 18     Temp 11/03/16 1555 98.5 F (36.9 C)     Temp Source  11/03/16 1555 Oral     SpO2 11/03/16 1555 95 %     Weight 11/03/16 1603 160 lb (72.6 kg)     Height 11/03/16 1603 5' 6.5" (1.689 m)     Head Circumference --      Peak Flow --      Pain Score 11/03/16 1603 0     Pain Loc --      Pain Edu? --      Excl. in [place]? --     Latest RR 24 93% HR 94 BP 136/55 VBG 7.475/43.8 LA 1.36 Na 139 K 3.9 BUN 17 Cr 1.15 up from baseline of 0.88 alb 2.3 WBC 10.3 Hg 10.1 at basline Trop 0.03 ammoia 22 CXR non acute CT head non acute Following Medications were ordered in ER: Medications  sodium chloride 0.9 % bolus 1,000 mL (0 mLs Intravenous Stopped 11/03/16 1815)  vancomycin (VANCOCIN) 1,250 mg in sodium chloride 0.9 % 250 mL IVPB (1,250 mg Intravenous New Bag/Given 11/03/16 1758)  piperacillin-tazobactam (ZOSYN) IVPB 3.375 g (0 g Intravenous Stopped 11/03/16 1845)  sodium chloride 0.9 % bolus 1,000 mL (1,000 mLs Intravenous New Bag/Given 11/03/16 1815)     Hospitalist was called for admission for SEPSIS due to recurrent UTI  Review of Systems:    Pertinent positives include: Fevers, chills,  fatigue, weight loss   Constitutional:  No weight loss, night sweats, HEENT:  No headaches, Difficulty swallowing,Tooth/dental problems,Sore throat,  No sneezing, itching, ear ache, nasal congestion, post nasal drip,  Cardio-vascular:  No chest pain, Orthopnea, PND, anasarca, dizziness, palpitations.no Bilateral lower extremity swelling  GI:  No heartburn, indigestion, abdominal pain, nausea, vomiting, diarrhea, change in bowel habits, loss of appetite, melena, blood in stool, hematemesis Resp:  no shortness of breath at rest. No dyspnea on exertion, No excess mucus, no productive cough, No non-productive cough, No coughing up of blood.No change in color of mucus.No wheezing. Skin:  no rash or lesions. No jaundice GU:  no dysuria, change in color of urine, no urgency or frequency. No straining to urinate.  No flank pain.  Musculoskeletal:  No joint  pain or no joint swelling. No decreased range of motion. No back pain.  Psych:  No change in mood or affect. No depression or anxiety. No memory loss.  Neuro: no localizing neurological complaints, no tingling, no weakness, no double vision, no gait abnormality, no slurred speech, no confusion  As per HPI otherwise 10 point review of systems negative.   Past Medical History: Past Medical History:  Diagnosis Date  . Arthritis   . Chronic kidney disease    kidney stones  . History of hiatal hernia   . Macular degeneration   . Pneumonia    hx. of  . Urolithiasis   . Varicose veins    bilateral LE     Past Surgical History:  Procedure Laterality Date  . ABDOMINAL HYSTERECTOMY    . BUNIONECTOMY Right   . CYSTOSCOPY W/ URETERAL STENT REMOVAL Right 10/26/2016   Procedure: CYSTOSCOPY WITH STENT REMOVAL;  Surgeon: Irine Seal, MD;  Location: [facility];  Service: Urology;  Laterality: Right;  . CYSTOSCOPY WITH STENT PLACEMENT Right 08/15/2014   Procedure: CYSTOSCOPY WITH RIGHT STENT PLACEMENT;  Surgeon: [name], MD;  Location: [facility];  Service: Urology;  Laterality: Right;  . CYSTOSCOPY WITH STENT PLACEMENT Right 10/02/2016   Procedure: CYSTOSCOPY, RIGHT RETROGRADE WITH RIGHT URETERAL STENT PLACEMENT;  Surgeon: Irine Seal, MD;  Location: [facility];  Service: Urology;  Laterality: Right;  . CYSTOSCOPY/URETEROSCOPY/HOLMIUM LASER/STENT PLACEMENT Right 10/19/2016   Procedure: CYSTOSCOPY/URETEROSCOPY/HOLMIUM LASER/STENT EXCHANGE;  Surgeon: Irine Seal, MD;  Location: [facility];  Service: Urology;  Laterality: Right;  ONLY NEEDS 60 MINUTES FOR PROCEDURE  . EXTRACORPOREAL SHOCK WAVE LITHOTRIPSY Right 08/18/2014  . JOINT REPLACEMENT Right    Total hip replacement  . JOINT REPLACEMENT Right    Total knee replacement  . ORIF WRIST FRACTURE Left   . TONSILLECTOMY       Social History:  Ambulatory  bed bound    reports that she quit smoking about 53 years ago. Her smoking use included  Cigarettes. She has a 3.50 pack-year smoking history. She has never used smokeless tobacco. She reports that she drinks about 4.2 oz of alcohol per week . She reports that she does not use drugs.  Allergies:  No Known Allergies     Family History:   Family History  Problem Relation Age of Onset  . Heart disease Mother   . Asthma Mother   . Emphysema Mother        smoked  . Heart disease Father     Medications: Prior to Admission medications   Medication Sig Start Date End Date Taking? Authorizing Provider  acetaminophen (TYLENOL) 325 MG tablet Take 325 mg by mouth every 6 (six) hours as needed for moderate pain or headache.    [provider]  bisacodyl (DULCOLAX) 5 MG EC tablet Take 1 tablet (5 mg total) by mouth daily as needed for moderate constipation. 10/27/16  Manuella Ghazi, Pratik D, DO  dronabinol (MARINOL) 2.5 MG capsule Take 1 capsule (2.5 mg total) by mouth 2 (two) times daily before lunch and supper. 10/27/16   Manuella Ghazi, Pratik D, DO  feeding supplement, ENSURE ENLIVE, (ENSURE ENLIVE) LIQD Take 237 mLs by mouth 3 (three) times daily between meals. 10/24/16   Stasia Cavalier, MD  fluconazole (DIFLUCAN) 40 MG/ML suspension Take 10 mLs (400 mg total) by mouth daily. 10/24/16 11/11/16  Stasia Cavalier, MD  latanoprost (XALATAN) 0.005 % ophthalmic solution PLACE 1 DROP INTO BOTH EYES ONCE DAILY 08/28/16   [provider]  loperamide (IMODIUM) 2 MG capsule Take 2 mg by mouth as needed for diarrhea or loose stools.    [provider]  LORazepam (ATIVAN) 0.5 MG tablet Take 0.5 mg by mouth 2 (two) times daily as needed for anxiety.    [provider]  mirtazapine (REMERON SOL-TAB) 15 MG disintegrating tablet Take 1 tablet (15 mg total) by mouth at bedtime. 10/27/16   Manuella Ghazi, Pratik D, DO  naproxen sodium (ANAPROX) 220 MG tablet Take 220 mg by mouth as needed.    [provider]  polyethylene glycol (MIRALAX / GLYCOLAX) packet Take 17 g by  mouth daily as needed for mild constipation. 10/27/16   Manuella Ghazi, Pratik D, DO  PREVIDENT 5000 BOOSTER PLUS 1.1 % PSTE Use once daily as directed 09/01/16   [provider]  senna-docusate (SENOKOT-S) 8.6-50 MG tablet Take 1 tablet by mouth at bedtime as needed for mild constipation. 10/24/16   Stasia Cavalier, MD  terazosin (HYTRIN) 1 MG capsule Take 1 capsule (1 mg total) by mouth at bedtime. 10/04/16   Florencia Reasons, MD  traMADol (ULTRAM) 50 MG tablet Take 1-2 tablets (50-100 mg total) by mouth every 6 (six) hours as needed for moderate pain or severe pain. 10/27/16   Heath Lark D, DO    Physical Exam: Patient Vitals for the past 24 hrs:  BP Temp Temp src Pulse Resp SpO2 Height Weight  11/03/16 1845 (!) 136/55 - - 95 (!) 24 93 % - -  11/03/16 1830 125/66 - - 95 (!) 23 94 % - -  11/03/16 1815 (!) 131/58 - - 96 18 96 % - -  11/03/16 1800 137/62 - - 100 (!) 23 93 % - -  11/03/16 1745 (!) 133/38 - - (!) 105 18 91 % - -  11/03/16 1730 132/68 - - (!) 101 (!) 25 95 % - -  11/03/16 1715 - (!) 100.5 F (38.1 C) Rectal - - - - -  11/03/16 1715 (!) 148/64 - - (!) 119 19 (!) 89 % - -  11/03/16 1700 (!) 115/48 - - (!) 103 (!) 26 93 % - -  11/03/16 1645 130/63 - - (!) 106 20 92 % - -  11/03/16 1630 120/62 - - (!) 101 (!) 32 93 % - -  11/03/16 1615 (!) 106/45 - - (!) 104 (!) 30 96 % - -  11/03/16 1603 - - - - - - 5' 6.5" (1.689 m) 72.6 kg (160 lb)  11/03/16 1600 (!) 112/57 - - (!) 105 (!) 28 93 % - -  11/03/16 1555 129/62 98.5 F (36.9 C) Oral (!) 106 18 95 % - -    1. General:  in No Acute distress  Chronically ill  -appearing 2. Psychological: Alert and  Oriented 3. Head/ENT:   Dry Mucous Membranes  Head Non traumatic, neck supple                           Poor Dentition 4. SKIN:  decreased Skin turgor,  Skin clean Dry and intact no rash 5. Heart: Regular rate and rhythm no Murmur, no Rub or gallop 6. Lungs:  no wheezes or crackles   7. Abdomen: Soft,   non-tender, Non distended   bowel sounds present 8. Lower extremities: no clubbing, cyanosis, or edema 9. Neurologically Grossly intact, 10. MSK: decreased range of motion, Joint swelling of Right wrist Right knee, bilateral feet.    body mass index is 25.44 kg/m.  Labs on Admission:   Labs on Admission: I have personally reviewed following labs and imaging studies  CBC:  Recent Labs Lab 11/03/16 1624 11/03/16 1718  WBC 10.3  --   NEUTROABS 7.7  --   HGB 10.1* 10.5*  HCT 32.3* 31.0*  MCV 93.1  --   PLT 493*  --    Basic Metabolic Panel:  Recent Labs Lab 11/03/16 1624 11/03/16 1718  NA 138 139  K 3.8 3.9  CL 99* 99*  CO2 30  --   GLUCOSE 108* 106*  BUN 15 17  CREATININE 1.15* 1.00  CALCIUM 8.7*  --    GFR: Estimated Creatinine Clearance: 43.2 mL/min (by C-G formula based on SCr of 1 mg/dL). Liver Function Tests:  Recent Labs Lab 11/03/16 1624  AST 35  ALT 22  ALKPHOS 181*  BILITOT 1.3*  PROT 6.4*  ALBUMIN 2.3*   No results for input(s): LIPASE, AMYLASE in the last 168 hours.  Recent Labs Lab 11/03/16 1624  AMMONIA 22   Coagulation Profile: No results for input(s): INR, PROTIME in the last 168 hours. Cardiac Enzymes: No results for input(s): CKTOTAL, CKMB, CKMBINDEX, TROPONINI in the last 168 hours. BNP (last 3 results) No results for input(s): PROBNP in the last 8760 hours. HbA1C: No results for input(s): HGBA1C in the last 72 hours. CBG:  Recent Labs Lab 11/03/16 1712  GLUCAP 103*   Lipid Profile: No results for input(s): CHOL, HDL, LDLCALC, TRIG, CHOLHDL, LDLDIRECT in the last 72 hours. Thyroid Function Tests: No results for input(s): TSH, T4TOTAL, FREET4, T3FREE, THYROIDAB in the last 72 hours. Anemia Panel: No results for input(s): VITAMINB12, FOLATE, FERRITIN, TIBC, IRON, RETICCTPCT in the last 72 hours. Urine analysis:    Component Value Date/Time   COLORURINE AMBER (A) 11/03/2016 1645   APPEARANCEUR HAZY (A) 11/03/2016 1645     LABSPEC 1.016 11/03/2016 1645   PHURINE 5.0 11/03/2016 1645   GLUCOSEU NEGATIVE 11/03/2016 1645   HGBUR SMALL (A) 11/03/2016 [place] 11/03/2016 1645   KETONESUR NEGATIVE 11/03/2016 1645   PROTEINUR 30 (A) 11/03/2016 1645   NITRITE POSITIVE (A) 11/03/2016 1645   LEUKOCYTESUR LARGE (A) 11/03/2016 1645   Sepsis Labs: @LABRCNTIP (procalcitonin:4,lacticidven:4) )No results found for this or any previous visit (from the past 240 hour(s)).    UA  evidence of UTI    No results found for: HGBA1C  Estimated Creatinine Clearance: 43.2 mL/min (by C-G formula based on SCr of 1 mg/dL).  BNP (last 3 results) No results for input(s): PROBNP in the last 8760 hours.   ECG REPORT  Independently reviewed Rate: 103  Rhythm: sinus tachycardia ST&T Change: t wave inversion in I and avL QTC 535  Filed Weights   11/03/16 1603  Weight: 72.6 kg (160 lb)     Cultures:  Component Value Date/Time   SDES BLOOD RIGHT HAND 10/24/2016 1218   SPECREQUEST  10/24/2016 1218    BOTTLES DRAWN AEROBIC AND ANAEROBIC Blood Culture adequate volume   CULT  10/24/2016 1218    NO GROWTH 5 DAYS Performed at [facility], [place] [address], [place], [place] [address]    REPTSTATUS 10/29/2016 FINAL 10/24/2016 1218     Radiological Exams on Admission: Dg Chest 2 View  Result Date: 11/03/2016 CLINICAL DATA:  Altered mental status. EXAM: CHEST  2 VIEW COMPARISON:  10/22/2016 and prior radiographs.  10/01/2016 CT FINDINGS: Cardiomediastinal silhouette is unchanged with moderate to large hiatal hernia again noted. This is a low volume film. There is no evidence of focal airspace disease, pulmonary edema, suspicious pulmonary nodule/mass, pleural effusion, or pneumothorax. No acute bony abnormalities are identified. IMPRESSION: No evidence of acute cardiopulmonary disease. Hiatal hernia. Electronically Signed   By: Margarette Canada M.D.   On: 11/03/2016 17:55   Ct Head Wo Contrast  Result  Date: 11/03/2016 CLINICAL DATA:  Altered level consciousness. EXAM: CT HEAD WITHOUT CONTRAST TECHNIQUE: Contiguous axial images were obtained from the base of the skull through the vertex without intravenous contrast. COMPARISON:  02/08/2011 FINDINGS: Brain: There is atrophy and chronic small vessel disease changes. No acute intracranial abnormality. Specifically, no hemorrhage, hydrocephalus, mass lesion, acute infarction, or significant intracranial injury. Vascular: No hyperdense vessel or unexpected calcification. Skull: No acute calvarial abnormality. Sinuses/Orbits: Visualized paranasal sinuses and mastoids clear. Orbital soft tissues unremarkable. Other: None IMPRESSION: No acute intracranial abnormality. Atrophy, chronic microvascular disease. Electronically Signed   By: Rolm Baptise M.D.   On: 11/03/2016 17:43   Ct Renal Stone Study  Result Date: 11/03/2016 CLINICAL DATA:  Abdominal pain.  Recent sepsis. EXAM: CT ABDOMEN AND PELVIS WITHOUT CONTRAST TECHNIQUE: Multidetector CT imaging of the abdomen and pelvis was performed following the standard protocol without IV contrast. COMPARISON:  10/01/2016. FINDINGS: Lower chest: Interval mild right lower lobe atelectasis and minimal left lower lobe atelectasis. There is also interval 1.4 cm oval density in the left lower lobe, adjacent to a large hiatal hernia containing stomach and fat. Hepatobiliary: The liver remains somewhat small. 7 mm gallstone in the gallbladder without gallbladder wall thickening or pericholecystic fluid. Pancreas: Moderate diffuse pancreatic atrophy. Spleen: Normal in size without focal abnormality. Adrenals/Urinary Tract: Tiny mid and lower right renal calculi. The previously demonstrated 8 mm right UPJ calculus is no longer demonstrated. The previously demonstrated right hydronephrosis has resolved. There are mildly prominent bilateral extrarenal pelves, right greater than left. Moderate atrophy of the left kidney is again noted.  No bladder or ureteral calculi are seen. Normal appearing adrenal glands. Stomach/Bowel: Large hiatal hernia containing stomach and fat. Large number of colonic diverticula without evidence of diverticulitis. Normal appearing appendix. Unremarkable small bowel. Vascular/Lymphatic: Atheromatous arterial calcifications without aneurysm. No enlarged lymph nodes. Reproductive: Status post hysterectomy. No adnexal masses. Other: No abdominal wall hernia or abnormality. No abdominopelvic ascites. Musculoskeletal: Right hip prosthesis with associated streak artifacts. Lumbar spine degenerative changes and scoliosis. These include facet degenerative changes with associated grade 1 anterolisthesis at the L4-5 and L5-S1 levels. No pars defects or fractures are seen. IMPRESSION: 1. Interval 1.4 cm oval density in the left lower lobe, adjacent to a large hiatal hernia. This has an appearance compatible with a rounded area of atelectasis or pneumonia. 2. Mild right lower lobe and minimal left lower lobe linear atelectasis. 3. Cholelithiasis without evidence of cholecystitis. 4. The previously demonstrated right UPJ calculus  and hydronephrosis are no longer present. 5. Tiny, nonobstructing right renal calculi. 6. Large hiatal hernia containing stomach and fat. 7. Extensive colonic diverticulosis. 8. Stable moderate left renal atrophy. Electronically Signed   By: Claudie Revering M.D.   On: 11/03/2016 21:26    Chart has been reviewed    Assessment/Plan  83 y.o. female with medical history significant of kidney stones, macular degeneration, varicose veins, OA, recent fungemia   Admitted for SEPSIS due to recurrent UTI   Present on Admission: . Acute lower UTI -cefepime, given recent admission.  Await results of urine culture . Acute encephalopathy -progressive patient has progressive decline.  Partially secondary to dehydration we will rehydrate also possibly secondary to polypharmacy will hold sedating medication .  Sepsis ([place]) most likely secondary to UTI but also possibly secondary to small HCAP -broaden antibiotic coverage order sputum cultures . Physical deconditioning PT/OT eval . AKI (acute kidney injury) ([place]) rehydrate and follow urine function . Dehydration -continue to rehydrate . Hypoalbuminemia most likely secondary to poor p.o. intake check prealbumin nutritional consult . Anemia order anemia panel and evaluate . Fungemia: Candida -patient has hard time tolerating p.o. so we will switch to IV fluconazole per pharmacy . HCAP (healthcare-associated pneumonia) continue broad-spectrum antibiotics evaluate for aspiration Prolonged QTC  - - will monitor on tele avoid QT prolonging medications, rehydrate correct electrolytes Joint pain swelling -obtain plain imaging of her right wrist destructive check uric acid sed rate CRP patient has had a recent echogram to evaluate for possible vegetation.  Discussed with family the possibility of TEE if persistent bacteremia at this point family leans against over aggressive interventions. repeat blood cultures   Other plan as per orders.  DVT prophylaxis:     Code Status:   DNR/DNI   as per  family the patient appears to be rapidly declining suspect poor prognosis discussed with family  Family Communication:   Family at  Bedside  plan of care was discussed with   Daughter,   Disposition Plan:     Back to current facility when stable                   Would benefit from PT/OT eval prior to DC ordered                     Social Work    consulted                          Consults called: none   Admission status:    inpatient       Level of care    tele       I have spent a total of 56 min on this admission    Talton Delpriore 11/03/2016, 9:49 PM    [facility]  Pager [phone]   after 2 AM please page floor coverage PA If 7AM-7PM, please contact the day team taking care of the patient  [email or web address]  Password [email or web address]

## 2016-11-03 NOTE — Progress Notes (Signed)
Pharmacy Antibiotic Note  Caroline Griffith is a 83 y.o. female admitted on 11/03/2016 with UTI.  Pharmacy has been consulted for cefepime dosing. Temp max 100.5, WBC 10.3, LA 1.36, Scr 1.15 (Baseline 0.82-0.88). Zosyn 3.375g IV X1 and Vancomycin 1250mg  IV X1 received in ED.   Plan: Cefepime 1g IV every 24 hours- will start 8 hours after receiving zosyn dose.  Monitor for Scr, c/s, clinical resolution. F/u de-escalation plan/LOT   Height: 5' 6.5" (168.9 cm) Weight: 160 lb (72.6 kg) IBW/kg (Calculated) : 60.45  Temp (24hrs), Avg:99.5 F (37.5 C), Min:98.5 F (36.9 C), Max:100.5 F (38.1 C)   Recent Labs Lab 11/03/16 1624 11/03/16 1718 11/03/16 1719  WBC 10.3  --   --   CREATININE 1.15* 1.00  --   LATICACIDVEN  --   --  1.36    Estimated Creatinine Clearance: 43.2 mL/min (by C-G formula based on SCr of 1 mg/dL).    No Known Allergies  Antimicrobials this admission: Cefepime 10/24 >>  Zosyn 10/24 >> 10/24 Vancomycin 10/24>>10/24  Microbiology results: 10/24 BCx:  10/24 UCx:     Thank you for allowing pharmacy to be a part of this patient's care.  Jerrye Noble, PharmD Candidate 11/03/2016 8:11 PM

## 2016-11-04 ENCOUNTER — Inpatient Hospital Stay (HOSPITAL_COMMUNITY): Payer: PPO

## 2016-11-04 DIAGNOSIS — G9340 Encephalopathy, unspecified: Secondary | ICD-10-CM

## 2016-11-04 DIAGNOSIS — N189 Chronic kidney disease, unspecified: Secondary | ICD-10-CM | POA: Diagnosis present

## 2016-11-04 DIAGNOSIS — R5381 Other malaise: Secondary | ICD-10-CM

## 2016-11-04 LAB — COMPREHENSIVE METABOLIC PANEL
ALT: 20 U/L (ref 14–54)
ALT: 18 U/L (ref 14–54)
AST: 29 U/L (ref 15–41)
AST: 25 U/L (ref 15–41)
Albumin: 1.9 g/dL — ABNORMAL LOW (ref 3.5–5.0)
Albumin: 1.8 g/dL — ABNORMAL LOW (ref 3.5–5.0)
Alkaline Phosphatase: 160 U/L — ABNORMAL HIGH (ref 38–126)
Alkaline Phosphatase: 148 U/L — ABNORMAL HIGH (ref 38–126)
Anion gap: 8 (ref 5–15)
Anion gap: 10 (ref 5–15)
BUN: 12 mg/dL (ref 6–20)
BUN: 11 mg/dL (ref 6–20)
CHLORIDE: 106 mmol/L (ref 101–111)
CO2: 26 mmol/L (ref 22–32)
CO2: 25 mmol/L (ref 22–32)
CREATININE: 1.03 mg/dL — AB (ref 0.44–1.00)
Calcium: 8 mg/dL — ABNORMAL LOW (ref 8.9–10.3)
Calcium: 7.9 mg/dL — ABNORMAL LOW (ref 8.9–10.3)
Chloride: 106 mmol/L (ref 101–111)
Creatinine, Ser: 0.92 mg/dL (ref 0.44–1.00)
GFR calc Af Amer: 56 mL/min — ABNORMAL LOW (ref >60)
GFR, EST NON AFRICAN AMERICAN: 49 mL/min — AB (ref >60)
GFR, EST NON AFRICAN AMERICAN: 56 mL/min — AB (ref >60)
Glucose, Bld: 92 mg/dL (ref 65–99)
Glucose, Bld: 91 mg/dL (ref 65–99)
POTASSIUM: 3.4 mmol/L — AB (ref 3.5–5.1)
POTASSIUM: 3.8 mmol/L (ref 3.5–5.1)
Sodium: 140 mmol/L (ref 135–145)
Sodium: 141 mmol/L (ref 135–145)
Total Bilirubin: 1.2 mg/dL (ref 0.3–1.2)
Total Bilirubin: 1.1 mg/dL (ref 0.3–1.2)
Total Protein: 5.8 g/dL — ABNORMAL LOW (ref 6.5–8.1)
Total Protein: 5.4 g/dL — ABNORMAL LOW (ref 6.5–8.1)

## 2016-11-04 LAB — CBC
HCT: 28.3 % — ABNORMAL LOW (ref 36.0–46.0)
Hemoglobin: 8.7 g/dL — ABNORMAL LOW (ref 12.0–15.0)
MCH: 28.7 pg (ref 26.0–34.0)
MCHC: 30.7 g/dL (ref 30.0–36.0)
MCV: 93.4 fL (ref 78.0–100.0)
PLATELETS: 433 10*3/uL — AB (ref 150–400)
RBC: 3.03 MIL/uL — ABNORMAL LOW (ref 3.87–5.11)
RDW: 14.2 % (ref 11.5–15.5)
WBC: 6.6 10*3/uL (ref 4.0–10.5)

## 2016-11-04 LAB — CBC WITH DIFFERENTIAL/PLATELET
BASOS ABS: 0.1 10*3/uL (ref 0.0–0.1)
Basophils Relative: 1 %
Eosinophils Absolute: 0.3 10*3/uL (ref 0.0–0.7)
Eosinophils Relative: 5 %
HEMATOCRIT: 27.9 % — AB (ref 36.0–46.0)
Hemoglobin: 8.8 g/dL — ABNORMAL LOW (ref 12.0–15.0)
Lymphocytes Relative: 21 %
Lymphs Abs: 1.6 10*3/uL (ref 0.7–4.0)
MCH: 29.3 pg (ref 26.0–34.0)
MCHC: 31.5 g/dL (ref 30.0–36.0)
MCV: 93 fL (ref 78.0–100.0)
MONO ABS: 0.7 10*3/uL (ref 0.1–1.0)
MONOS PCT: 9 %
NEUTROS ABS: 4.8 10*3/uL (ref 1.7–7.7)
Neutrophils Relative %: 64 %
Platelets: 453 10*3/uL — ABNORMAL HIGH (ref 150–400)
RBC: 3 MIL/uL — ABNORMAL LOW (ref 3.87–5.11)
RDW: 14.1 % (ref 11.5–15.5)
WBC: 7.5 10*3/uL (ref 4.0–10.5)

## 2016-11-04 LAB — SEDIMENTATION RATE: Sed Rate: 119 mm/hr — ABNORMAL HIGH (ref 0–22)

## 2016-11-04 LAB — STREP PNEUMONIAE URINARY ANTIGEN: STREP PNEUMO URINARY ANTIGEN: NEGATIVE

## 2016-11-04 LAB — MAGNESIUM: MAGNESIUM: 1.7 mg/dL (ref 1.7–2.4)

## 2016-11-04 LAB — PREALBUMIN: Prealbumin: 5.2 mg/dL — ABNORMAL LOW (ref 18–38)

## 2016-11-04 LAB — LACTIC ACID, PLASMA
LACTIC ACID, VENOUS: 0.9 mmol/L (ref 0.5–1.9)
Lactic Acid, Venous: 0.7 mmol/L (ref 0.5–1.9)

## 2016-11-04 LAB — URIC ACID: URIC ACID, SERUM: 4.6 mg/dL (ref 2.3–6.6)

## 2016-11-04 LAB — TSH: TSH: 1.2 u[IU]/mL (ref 0.350–4.500)

## 2016-11-04 LAB — PROCALCITONIN: Procalcitonin: 0.75 ng/mL

## 2016-11-04 LAB — MRSA PCR SCREENING: MRSA by PCR: NEGATIVE

## 2016-11-04 LAB — C-REACTIVE PROTEIN: CRP: 20.9 mg/dL — ABNORMAL HIGH (ref <1.0)

## 2016-11-04 LAB — PHOSPHORUS: PHOSPHORUS: 3.2 mg/dL (ref 2.5–4.6)

## 2016-11-04 MED ORDER — POTASSIUM CHLORIDE 10 MEQ/100ML IV SOLN
10.0000 meq | INTRAVENOUS | Status: AC
  Administered 2016-11-04 (×2): 10 meq via INTRAVENOUS
  Filled 2016-11-04: qty 100

## 2016-11-04 MED ORDER — MAGNESIUM SULFATE 2 GM/50ML IV SOLN
2.0000 g | Freq: Once | INTRAVENOUS | Status: AC
  Administered 2016-11-04: 2 g via INTRAVENOUS
  Filled 2016-11-04: qty 50

## 2016-11-04 MED ORDER — RESOURCE THICKENUP CLEAR PO POWD
ORAL | Status: DC | PRN
  Administered 2016-11-05: 12:00:00 via ORAL
  Filled 2016-11-04: qty 125

## 2016-11-04 MED ORDER — ENOXAPARIN SODIUM 40 MG/0.4ML ~~LOC~~ SOLN
40.0000 mg | SUBCUTANEOUS | Status: DC
  Administered 2016-11-05 – 2016-11-08 (×4): 40 mg via SUBCUTANEOUS
  Filled 2016-11-04 (×5): qty 0.4

## 2016-11-04 MED ORDER — SODIUM CHLORIDE 0.9 % IV SOLN
INTRAVENOUS | Status: DC
  Administered 2016-11-04 – 2016-11-07 (×5): via INTRAVENOUS

## 2016-11-04 MED ORDER — FERROUS SULFATE 325 (65 FE) MG PO TABS: 325.0000 mg | ORAL_TABLET | Freq: Two times a day (BID) | ORAL | Status: DC

## 2016-11-04 MED ORDER — DRONABINOL 2.5 MG PO CAPS
2.5000 mg | ORAL_CAPSULE | Freq: Two times a day (BID) | ORAL | Status: DC
  Administered 2016-11-04 – 2016-11-08 (×7): 2.5 mg via ORAL
  Filled 2016-11-04 (×8): qty 1

## 2016-11-04 MED ORDER — ENSURE ENLIVE PO LIQD
237.0000 mL | Freq: Two times a day (BID) | ORAL | Status: DC
  Administered 2016-11-05 – 2016-11-08 (×6): 237 mL via ORAL

## 2016-11-04 NOTE — Evaluation (Signed)
Physical Therapy Evaluation Patient Details Name: Caroline Griffith MRN: [id number] DOB: [birth year] Today's Date: 11/04/2016   History of Present Illness  Caroline Griffith is a 83 y.o. female with medical history significant of kidney stones, macular degeneration, varicose veins, OA. admitted for confusion, weakness, and FTT.  Clinical Impression  Pt was indep up until one month ago. Living alone and driving. Per daughter, staff at [facility] did not get her up and had her stay in bed all day the entire time she was there. Pt motivated and strongly desires to return home but is aware she is very deconditioned and is at a high falls risk. Pt also with noted R sided weakness and difficulty swallowing.  Pt to benefit from CIR to achieve minA level of function for safe return home with 24/7 hired help.    Follow Up Recommendations CIR    Equipment Recommendations   (TBD)    Recommendations for Other Services Rehab consult     Precautions / Restrictions Precautions Precautions: Fall Restrictions Weight Bearing Restrictions: No      Mobility  Bed Mobility Overal bed mobility: Needs Assistance Bed Mobility: Supine to Sit     Supine to sit: Max assist     General bed mobility comments: pt able to bring LEs off to EOB, maxA for trunk elevation and to scoot hips to EOB  Transfers Overall transfer level: Needs assistance Equipment used:  (1 person lift from the front with gait belt) Transfers: Sit to/from [facility] Sit to Stand: Max assist Stand pivot transfers: Max assist       General transfer comment: attempted to use RW however pt impulsively sat down due to starting to urinate. for safety PT performed transfer with gait belt from the front. pt complete std pvt to [facility] and then to chair. max v/c's for sequencing steps and to keep hands on PT as pt would not let go of BSC, increased time, unable to get completely upright  Ambulation/Gait              General Gait Details: did not attempt today  Stairs            Wheelchair Mobility    Modified Rankin (Stroke Patients Only)       Balance Overall balance assessment: Needs assistance Sitting-balance support: No upper extremity supported Sitting balance-Leahy Scale: Fair Sitting balance - Comments: pt able to maintain static balance with close supervision and feet on the floor x 3 min   Standing balance support: Bilateral upper extremity supported Standing balance-Leahy Scale: Poor Standing balance comment: dependent on physical assist                             Pertinent Vitals/Pain Pain Assessment: No/denies pain    Home Living Family/patient expects to be discharged to:: Unsure Living Arrangements: Alone Available Help at Discharge: Personal care attendant;Available 24 hours/day (dtr reports they have hired help that can be 24/7) Type of Home: House Home Access: Stairs to enter Entrance Stairs-Rails: Psychiatric nurse of Steps: [place]: One level Home Equipment: Environmental consultant - 2 wheels;Cane - single point Additional Comments: pt recently d/c'd to [facility] on Nov 06, 2022. Dtr reports "over my dead body" when asked if the plan was to return there..    Prior Function Level of Independence: Needs assistance   Gait / Transfers Assistance Needed: pt has been in bed for the last week., prior to last  hospital admission pt was indep and driving  ADL's / Homemaking Assistance Needed: needed help from SNF staff        Hand Dominance        Extremity/Trunk Assessment   Upper Extremity Assessment Upper Extremity Assessment: Generalized weakness    Lower Extremity Assessment Lower Extremity Assessment: Generalized weakness (R LE weaker than L, pt used hands to move it)    Cervical / Trunk Assessment Cervical / Trunk Assessment: Kyphotic  Communication   Communication: No difficulties  Cognition Arousal/Alertness:  Awake/alert Behavior During Therapy: WFL for tasks assessed/performed Overall Cognitive Status: Impaired/Different from baseline Area of Impairment: Orientation;Problem solving;Safety/judgement;Following commands                 Orientation Level: Disoriented to;Situation;Place (stated [facility], but able to recall after reorien)     Following Commands: Follows one step commands with increased time Safety/Judgement: Decreased awareness of safety;Decreased awareness of deficits   Problem Solving: Slow processing;Difficulty sequencing;Requires verbal cues;Requires tactile cues General Comments: per dtr pt more talkative and alert today and able to hold a converstation. per dtr yesterday she couldn't talk or stay awake      General Comments General comments (skin integrity, edema, etc.): bilat lower legs discolored    Exercises     Assessment/Plan    PT Assessment Patient needs continued PT services  PT Problem List Decreased strength;Decreased activity tolerance;Decreased balance;Decreased mobility;Decreased coordination;Decreased cognition;Decreased knowledge of use of DME;Decreased safety awareness       PT Treatment Interventions DME instruction;Gait training;Therapeutic activities;Therapeutic exercise;Patient/family education;Balance training;Functional mobility training    PT Goals (Current goals can be found in the Care Plan section)  Acute Rehab PT Goals Patient Stated Goal: rehab PT Goal Formulation: With patient/family Time For Goal Achievement: 11/11/16 Potential to Achieve Goals: Good    Frequency Min 3X/week   Barriers to discharge        Co-evaluation               AM-PAC PT "6 Clicks" Daily Activity  Outcome Measure Difficulty turning over in bed (including adjusting bedclothes, sheets and blankets)?: Unable Difficulty moving from lying on back to sitting on the side of the bed? : Unable Difficulty sitting down on and standing up  from a chair with arms (e.g., wheelchair, bedside commode, etc,.)?: A Lot Help needed moving to and from a bed to chair (including a wheelchair)?: A Lot Help needed walking in hospital room?: Total Help needed climbing 3-5 steps with a railing? : Total 6 Click Score: 8    End of Session Equipment Utilized During Treatment: Gait belt Activity Tolerance: Patient tolerated treatment well Patient left: in chair;with call bell/phone within reach;with chair alarm set;with family/visitor present Nurse Communication: Mobility status PT Visit Diagnosis: Muscle weakness (generalized) (M62.81);Difficulty in walking, not elsewhere classified (R26.2)    Time: 9163-8466 PT Time Calculation (min) (ACUTE ONLY): 42 min   Charges:   PT Evaluation $PT Eval Moderate Complexity: 1 Mod PT Treatments $Therapeutic Activity: 23-37 mins   PT G Codes:        Kittie Plater, PT, DPT Pager #: [phone] Office #: [phone]   Sharyn Brilliant M Idaliz Tinkle 11/04/2016, 9:47 AM

## 2016-11-04 NOTE — Evaluation (Cosign Needed)
Clinical/Bedside Swallow Evaluation Patient Details  Name: Caroline Griffith MRN: [id number] Date of Birth: 1930/12/20  Today's Date: 11/04/2016 Time: SLP Start Time (ACUTE ONLY): 0933 SLP Stop Time (ACUTE ONLY): 0953 SLP Time Calculation (min) (ACUTE ONLY): 20 min  Past Medical History:  Past Medical History:  Diagnosis Date  . Anxiety   . Arthritis   . Chronic kidney disease    kidney stones  . History of hiatal hernia   . History of kidney stones   . Macular degeneration   . Pneumonia    hx. of  . Urolithiasis   . Varicose veins    bilateral LE     Past Surgical History:  Past Surgical History:  Procedure Laterality Date  . ABDOMINAL HYSTERECTOMY    . BUNIONECTOMY Right   . CYSTOSCOPY W/ URETERAL STENT REMOVAL Right 10/26/2016   Procedure: CYSTOSCOPY WITH STENT REMOVAL;  Surgeon: Irine Seal, MD;  Location: [facility];  Service: Urology;  Laterality: Right;  . CYSTOSCOPY WITH STENT PLACEMENT Right 08/15/2014   Procedure: CYSTOSCOPY WITH RIGHT STENT PLACEMENT;  Surgeon: [name], MD;  Location: [facility];  Service: Urology;  Laterality: Right;  . CYSTOSCOPY WITH STENT PLACEMENT Right 10/02/2016   Procedure: CYSTOSCOPY, RIGHT RETROGRADE WITH RIGHT URETERAL STENT PLACEMENT;  Surgeon: Irine Seal, MD;  Location: [facility];  Service: Urology;  Laterality: Right;  . CYSTOSCOPY/URETEROSCOPY/HOLMIUM LASER/STENT PLACEMENT Right 10/19/2016   Procedure: CYSTOSCOPY/URETEROSCOPY/HOLMIUM LASER/STENT EXCHANGE;  Surgeon: Irine Seal, MD;  Location: [facility];  Service: Urology;  Laterality: Right;  ONLY NEEDS 60 MINUTES FOR PROCEDURE  . EXTRACORPOREAL SHOCK WAVE LITHOTRIPSY Right 08/18/2014  . JOINT REPLACEMENT Right    Total hip replacement  . JOINT REPLACEMENT Right    Total knee replacement  . ORIF WRIST FRACTURE Left   . TONSILLECTOMY     HPI:  Caroline Griffith is a 83 y.o. female with medical history significant of kidney stones, macular degeneration, varicose veins, OA admitted for  confusion, weakness, and FTT. Previous BSE on 10/15 reports poor appetite with no signs of oropharyngeal or esophageal concerns. Esophogram on 07/08/2005 revealed large hiatal herneria without stricture or reflux and mild decrease in esophageal peristalsis. CXR 10/24 clear.    Assessment / Plan / Recommendation Clinical Impression  Pt observed for BSE; limited PO intake due to pt's lack of interest in eating. With max encouragement, SLP was able to observe one bite of grits and multiple cup sips of water. Immediate cough following cup sips of thin liquid; audible swallow, multiple swallows and anterior spillage also observed. Though pt exhibits consistent cough at baseline, occurring for the past few weeks per daughter report, cough following cup sips of thin liquid subjectively more reflexive/forced causing concern for possible aspiration. Above findings through skilled observation by SLP warrants objective swallow study to ensure safety; will f/u with MBS. Discussed concerns with pt and daughter who agree with plan. Diet recommendation pending results of study.    SLP Visit Diagnosis: Dysphagia, unspecified (R13.10)    Aspiration Risk  Moderate aspiration risk    Diet Recommendation          Other  Recommendations Oral Care Recommendations: Oral care BID   Follow up Recommendations Inpatient Rehab      Frequency and Duration            Prognosis Prognosis for Safe Diet Advancement: Fair Barriers to Reach Goals: Severity of deficits      Swallow Study   General Date of Onset: 10/25/16 HPI: Caroline Griffith  is a 83 y.o. female with medical history significant of kidney stones, macular degeneration, varicose veins, OA admitted for confusion, weakness, and FTT. Previous BSE on 10/15 reports poor appetite with no signs of oropharyngeal or esophageal concerns. Esophogram on 07/08/2005 revealed large hiatal herneria without stricture or reflux and mild decrease in esophageal peristalsis. CXR  10/24 clear.  Type of Study: Bedside Swallow Evaluation Diet Prior to this Study: Dysphagia 3 (soft);Thin liquids Temperature Spikes Noted: No Respiratory Status: Room air History of Recent Intubation: No Behavior/Cognition: Alert;Requires cueing;Other (Comment) (Needs to be motivated) Oral Cavity Assessment: Within Functional Limits Oral Care Completed by SLP: No Oral Cavity - Dentition: Adequate natural dentition Vision: Functional for self-feeding Self-Feeding Abilities: Needs assist Patient Positioning: Upright in chair Baseline Vocal Quality: Normal Volitional Cough: Strong Volitional Swallow: Able to elicit    Oral/Motor/Sensory Function Overall Oral Motor/Sensory Function: Mild impairment Facial ROM: Within Functional Limits Facial Symmetry: Within Functional Limits Facial Strength: Within Functional Limits;Other (Comment) (Reduced labial seal strength) Lingual ROM: Within Functional Limits Lingual Symmetry: Within Functional Limits Lingual Strength: Within Functional Limits Velum: Within Functional Limits Mandible: Within Functional Limits   Ice Chips Ice chips: Not tested   Thin Liquid Thin Liquid: Impaired Presentation: Cup Oral Phase Impairments: Reduced labial seal Oral Phase Functional Implications: Right anterior spillage Pharyngeal  Phase Impairments: Cough - Immediate;Multiple swallows    Nectar Thick Nectar Thick Liquid: Not tested   Honey Thick Honey Thick Liquid: Not tested   Puree Puree: Within functional limits (Textured Puree - grits)   Solid   GO   Solid: Not tested        Aaron Edelman, Student SLP 11/04/2016,12:37 PM

## 2016-11-04 NOTE — Progress Notes (Signed)
Occupational Therapy Evaluation Patient Details Name: Caroline Griffith MRN: [id number] DOB: [birth year] Today's Date: 11/04/2016    History of Present Illness Caroline Griffith is a 83 y.o. female with medical history significant of kidney stones, macular degeneration, varicose veins, OA. admitted for confusion, weakness, and FTT.   Clinical Impression   PTA, pt at SNF for rehab since last hospital admission. Prior to hospital admission, pt lived alone and was completely independent. Pt demonstrates a significant change in functional status and currently requires max A for mobility and ADL. Pt will benefit from intensive rehab at [facility] maximize functional level if independence and facilitate safe DC home with 24/7 assistance. Discussed with daughter who states they are arranging for 24/7 care after rehab. Will follow acutely to address established goals.     Follow Up Recommendations  CIR;Supervision/Assistance - 24 hour    Equipment Recommendations  Other (comment) (TBA)    Recommendations for Other Services Rehab consult     Precautions / Restrictions Precautions Precautions: Fall Restrictions Weight Bearing Restrictions: No      Mobility Bed Mobility Overal bed mobility: Needs Assistance Bed Mobility: Supine to Sit     Supine to sit: Max assist        Transfers Overall transfer level: Needs assistance Equipment used:  (1 person lift from the front with gait belt) Transfers: Sit to/from [employer] Transfers Sit to Stand: Max assist Stand pivot transfers: Max assist            Balance Overall balance assessment: Needs assistance Sitting-balance support: No upper extremity supported Sitting balance-Leahy Scale: Fair Sitting balance - Comments: pt able to maintain static balance with close supervision and feet on the floor x 3 min   Standing balance support: Bilateral upper extremity supported Standing balance-Leahy Scale: Poor Standing balance comment:  dependent on physical assist                           ADL either performed or assessed with clinical judgement   ADL Overall ADL's : Needs assistance/impaired Eating/Feeding: Minimal assistance (modified diet)   Grooming: Minimal assistance   Upper Body Bathing: Minimal assistance;Sitting   Lower Body Bathing: Maximal assistance;Sit to/from stand   Upper Body Dressing : Moderate assistance;Sitting   Lower Body Dressing: Maximal assistance;Sit to/from stand   Toilet Transfer: Maximal assistance;Stand-pivot   Toileting- Clothing Manipulation and Hygiene: Total assistance Toileting - Clothing Manipulation Details (indicate cue type and reason): incontinenet of urine     Functional mobility during ADLs: Maximal assistance (stand pivot only)       Vision Baseline Vision/History: Macular Degeneration       Perception     Praxis      Pertinent Vitals/Pain Pain Assessment: Faces Faces Pain Scale: Hurts little more Pain Location: "sore all over" Pain Descriptors / Indicators: Sore;Grimacing Pain Intervention(s): Limited activity within patient's tolerance     Hand Dominance     Extremity/Trunk Assessment Upper Extremity Assessment Upper Extremity Assessment: Generalized weakness (RUE appers slightly weaker than L)   Lower Extremity Assessment Lower Extremity Assessment: Generalized weakness   Cervical / Trunk Assessment Cervical / Trunk Assessment: Kyphotic   Communication Communication Communication: No difficulties   Cognition Arousal/Alertness: Awake/alert Behavior During Therapy: WFL for tasks assessed/performed Overall Cognitive Status: Impaired/Different from baseline Area of Impairment: Orientation;Problem solving;Safety/judgement;Following commands;Attention;Memory;Awareness                 Orientation Level: Disoriented to;Situation;Place (stated [facility],  but able to recall after reorien) Current Attention Level:  Selective Memory: Decreased short-term memory Following Commands: Follows one step commands with increased time Safety/Judgement: Decreased awareness of safety;Decreased awareness of deficits Awareness: Emergent   General Comments: Did not remember earleir PT session; stated " I need to get stronger or I'm going to die"   General Comments       Exercises     Shoulder Instructions      Home Living Family/patient expects to be discharged to:: Unsure Living Arrangements: Alone Available Help at Discharge: Personal care attendant;Available 24 hours/day (dtr reports they have hired help that can be 24/7) Type of Home: House Home Access: Stairs to enter [employer] of Steps: 4 Entrance Stairs-Rails: [place]: One level     Bathroom Shower/Tub: Occupational psychologist: [place]: Environmental consultant - 2 wheels;Cane - single point   Additional Comments: pt recently d/c'd to [facility] on 06-Nov-2022. Dtr reports "over my dead body" when asked if the plan was to return there..      Prior Functioning/Environment Level of Independence: Needs assistance  Gait / Transfers Assistance Needed: pt has been in bed for the last week., prior to last hospital admission pt was indep and driving ADL's / Homemaking Assistance Needed: needed help from SNF staff   Comments: using cane up until ~2 weeks ago.         OT Problem List: Decreased strength;Decreased range of motion;Decreased activity tolerance;Impaired balance (sitting and/or standing);Decreased cognition;Decreased safety awareness;Decreased knowledge of use of DME or AE      OT Treatment/Interventions: Self-care/ADL training;Therapeutic exercise;Energy conservation;DME and/or AE instruction;Therapeutic activities;Cognitive remediation/compensation;Patient/family education;Balance training    OT Goals(Current goals can be found in the care plan section) Acute Rehab OT Goals Patient Stated Goal:  rehab OT Goal Formulation: With patient/family Time For Goal Achievement: 11/18/16 Potential to Achieve Goals: Good ADL Goals Pt Will Perform Lower Body Bathing: with supervision;with set-up;sit to/from stand Pt Will Perform Lower Body Dressing: with set-up;with supervision;sit to/from stand Pt Will Transfer to Toilet: bedside commode;stand pivot transfer;with min guard assist Pt Will Perform Toileting - Clothing Manipulation and hygiene: with min guard assist Additional ADL Goal #1: Pt will demonstrte anticipatory awareness during ADL tasks   OT Frequency: Min 2X/week   Barriers to D/C:            Co-evaluation              AM-PAC PT "6 Clicks" Daily Activity     Outcome Measure Help from another person eating meals?: A Lot Help from another person taking care of personal grooming?: A Lot Help from another person toileting, which includes using toliet, bedpan, or urinal?: A Lot Help from another person bathing (including washing, rinsing, drying)?: A Lot Help from another person to put on and taking off regular upper body clothing?: A Lot Help from another person to put on and taking off regular lower body clothing?: A Lot 6 Click Score: 12   End of Session Equipment Utilized During Treatment: Gait belt Nurse Communication: Mobility status  Activity Tolerance: Patient tolerated treatment well Patient left: in chair;with call bell/phone within reach;with chair alarm set;with family/visitor present  OT Visit Diagnosis: Unsteadiness on feet (R26.81);Muscle weakness (generalized) (M62.81);Feeding difficulties (R63.3);Other symptoms and signs involving cognitive function                Time: 1320-1345 OT Time Calculation (min): 25 min Charges:  OT General Charges $OT  Visit: 1 Visit OT Evaluation $OT Eval Moderate Complexity: 1 Mod OT Treatments $Self Care/Home Management : 8-22 mins G-Codes:     [facility], OT/L  [phone] 11/04/2016  Caroline Griffith,HILLARY 11/04/2016, 1:59  PM

## 2016-11-04 NOTE — Progress Notes (Signed)
Rehab Admissions Coordinator Note:  Patient was screened by Retta Diones for appropriateness for an Inpatient Acute Rehab Consult.  At this time, we are recommending Inpatient Rehab consult.  Jodell Cipro M 11/04/2016, 12:21 PM  I can be reached at [phone].

## 2016-11-04 NOTE — Progress Notes (Signed)
Initial Nutrition Assessment  DOCUMENTATION CODES:   Non-severe (moderate) malnutrition in context of acute illness/injury  INTERVENTION:   Magic cup TID with meals, each supplement provides 290 kcal and 9 grams of protein  Ensure Enlive po BID, each supplement provides 350 kcal and 20 grams of protein. Supplement to be thickened by nursing to nectar consistency using Resource Thickenup   NUTRITION DIAGNOSIS:   Malnutrition (Moderate) related to acute illness (recent urological surgery with recurrent UTI, fungal infection) as evidenced by energy intake < 75% for > 7 days, mild depletion of body fat, moderate depletions of muscle mass.  GOAL:   Patient will meet greater than or equal to 90% of their needs  MONITOR:   PO intake, Supplement acceptance, Labs, Weight trends  REASON FOR ASSESSMENT:   Consult Assessment of nutrition requirement/status (malnutrition)  ASSESSMENT:    83 yo female admitted with progressive confusion and lethargy with decreased po intake with dx of sepsis due to recurrent UTI, HCAP.  Pt with hx of kidney stones, recent fungemia. Pt with recent urological procedure related to renal caluli.   Pt very sleepy on visit today. RD not able to obtain much history via patient.  Per chart review, Family reporting pt has been bed bound since discharge on 10/27/16 and has hardly eaten anything.   Diet downgraded to Nectar Thick liquids from Thin Liquids post MBSS this AM. Remains on Dysphagia III. Pt did not eat breakfast this AM, reports poor appetite. No recorded po intake.  No significant weight changes per weight encounters; weight variable (9/24 168, 10/05 157, 10/16 163, 10/24 160).   Nutrition-Focused physical exam completed. Findings are mild fat depletion in thoracic/lumbar and severe fat depletion in orbital region, mild/moderate to severe muscle depletion, and no edema.   Labs: reviewed Meds: NS at 75 ml/hr, marinol  Diet Order:  DIET DYS 3 Room  service appropriate? Yes; Fluid consistency: Nectar Thick  Skin:  Wound (see comment) (stage I sacrum)  Last BM:  10/25  Height:   Ht Readings from Last 1 Encounters:  11/03/16 5' 6.5" (1.689 m)    Weight:   Wt Readings from Last 1 Encounters:  11/03/16 160 lb (72.6 kg)    Ideal Body Weight:  60.5 kg  BMI:  Body mass index is 25.44 kg/m.  Estimated Nutritional Needs:   Kcal:  2376-2831 kcals  Protein:  75-84 g  Fluid:  >/= 1.5 L  EDUCATION NEEDS:   No education needs identified at this time  [place], [place], LDN [phone] Pager  [phone] Weekend/On-Call Pager

## 2016-11-04 NOTE — Progress Notes (Signed)
PROGRESS NOTE    Caroline Griffith  AYT:[id number] DOB: [birth year] DOA: 11/03/2016 PCP: Lavone Orn, MD  Outpatient Specialists: Podiatry ([facility] [place], [place])    Brief Narrative:  Patient is a 83 y.o. female w/ PMHx significant for CKD, renal calculi, hiatal hernia, falls, and h/o right ureteral stents who presented from SNF with AMS, weakness, and fatigue on 10/24.  Patient was discharged from [facility] on 10/17 for fungemia (candida), discharged on fluconazole.  In ED (10/24), patient met sepsis criteria, hemoglobin was 10.5 and UA was consistent with UTI.  CXR noted large hiatal hernia. Head CT showed chronic microvascular disease.  Renal stone study CT showed tiny, non-obstrucing right renal calculi and stable, moderate left renal atrophy.  Blood and urine cultures (10/24) pending.  Started cefepime 10/24 per pharmacy consult.  Swallow study performed today, report pending.  Patient's daughter expressed concern about patient returning to SNF, would like to consider inpatient rehab with eventual discharge to her home in [place], [place].   Assessment & Plan:   Active Problems:   Anemia   Fungemia: Candida   Acute lower UTI   Acute encephalopathy   Sepsis (HCC)   Physical deconditioning   AKI (acute kidney injury) ([place])   Dehydration   Hypoalbuminemia   HCAP (healthcare-associated pneumonia)   Prolonged QT interval   Urinary tract infection -UA (10/24) showed mall Hgb, positive protein and nitrites, large leukocytes, many bacteria -Urine culture pending -Continue IV cefepime as per pharmacy  Fungemia due to Candida -WBC 6.6 today, patient afebrile -Blood culture pending -Continue IV fluconazole  Anemia due to chronic kidney disease -Hemoglobin 8.7 today -Continue to monitor  Physical deconditioning -Swallow study (10/25) pending -Screen for possible admission in inpatient rehab   DVT prophylaxis: Lovenox Code Status: DNR Family Communication:  Daughter at bedside Disposition Plan: Discharge to rehab facility.  Family does not want patient to return to [facility].   Consultants:   Pharmacy  Nutrition  Social work  PT/OT  Procedures:   None  Antimicrobials:   IV vancomycin (10/24)  IV zosyn (10/24)  IV cefepime (10/24 - )   Subjective: Patient sitting in chair of hospital room, brushing teeth.  Patient's daughter at bedside.  Per daughter, patient is much improved from initial presentation.  Daughter states patient has a productive cough, worse at night.  Patient denies hemoptysis, SOB, chest pain.  Patient denies fever/chills, nausea/vomiting, abdominal pain.  Objective: Vitals:   11/03/16 2200 11/03/16 2215 11/03/16 2230 11/04/16 0422  BP: (!) 116/54 (!) 125/56  (!) 143/63  Pulse:    95  Resp:    19  Temp:   99.2 F (37.3 C) 98.5 F (36.9 C)  TempSrc:   Oral Oral  SpO2:    97%  Weight:      Height:        Intake/Output Summary (Last 24 hours) at 11/04/16 1145 Last data filed at 11/04/16 1000  Gross per 24 hour  Intake           2847.5 ml  Output              450 ml  Net           2397.5 ml   Filed Weights   11/03/16 1603  Weight: 72.6 kg (160 lb)    Examination:  General exam: Appears calm and comfortable in hospital chair.  No acute distress Respiratory system: Clear to auscultation bilaterally. Respiratory effort normal. Cardiovascular system: S1 &  S2 heard, RRR. No JVD, murmurs, rubs, gallops or clicks. No pedal edema. Gastrointestinal system: Abdomen is soft, nondistended, and nontender. No organomegaly or masses felt. Normal bowel sounds heard. Central nervous system: Alert and oriented. No focal neurological deficits. Extremities: Symmetric 5 x 5 power. Skin: No rashes, lesions or ulcers Psychiatry: Judgement and insight appear normal. Mood & affect appropriate.     Data Reviewed: I have personally reviewed following labs and imaging studies  CBC:  Recent Labs Lab  11/03/16 1624 11/03/16 1718 11/03/16 2328 11/04/16 0512  WBC 10.3  --  7.5 6.6  NEUTROABS 7.7  --  4.8  --   HGB 10.1* 10.5* 8.8* 8.7*  HCT 32.3* 31.0* 27.9* 28.3*  MCV 93.1  --  93.0 93.4  PLT 493*  --  453* 583*   Basic Metabolic Panel:  Recent Labs Lab 11/03/16 1624 11/03/16 1718 11/03/16 2328 11/04/16 0240 11/04/16 0512  NA 138 139 140  --  141  K 3.8 3.9 3.4*  --  3.8  CL 99* 99* 106  --  106  CO2 30  --  26  --  25  GLUCOSE 108* 106* 92  --  91  BUN _0 --  11  CREATININE 1.15* 1.00 1.03*  --  0.92  CALCIUM 8.7*  --  8.0*  --  7.9*  MG  --   --   --   --  1.7  PHOS  --   --   --  3.2  --    GFR: Estimated Creatinine Clearance: 46.9 mL/min (by C-G formula based on SCr of 0.92 mg/dL). Liver Function Tests:  Recent Labs Lab 11/03/16 1624 11/03/16 2328 11/04/16 0512  AST 35 29 25  ALT _1 ALKPHOS 181* 160* 148*  BILITOT 1.3* 1.2 1.1  PROT 6.4* 5.8* 5.4*  ALBUMIN 2.3* 1.9* 1.8*   No results for input(s): LIPASE, AMYLASE in the last 168 hours.  Recent Labs Lab 11/03/16 1624  AMMONIA 22   Coagulation Profile: No results for input(s): INR, PROTIME in the last 168 hours. Cardiac Enzymes: No results for input(s): CKTOTAL, CKMB, CKMBINDEX, TROPONINI in the last 168 hours. BNP (last 3 results) No results for input(s): PROBNP in the last 8760 hours. HbA1C: No results for input(s): HGBA1C in the last 72 hours. CBG:  Recent Labs Lab 11/03/16 1712  GLUCAP 103*   Lipid Profile: No results for input(s): CHOL, HDL, LDLCALC, TRIG, CHOLHDL, LDLDIRECT in the last 72 hours. Thyroid Function Tests:  Recent Labs  11/04/16 0240  TSH 1.200   Anemia Panel: No results for input(s): VITAMINB12, FOLATE, FERRITIN, TIBC, IRON, RETICCTPCT in the last 72 hours. Urine analysis:    Component Value Date/Time   COLORURINE AMBER (A) 11/03/2016 1645   APPEARANCEUR HAZY (A) 11/03/2016 1645   LABSPEC 1.016 11/03/2016 1645   PHURINE 5.0 11/03/2016 1645    GLUCOSEU NEGATIVE 11/03/2016 1645   HGBUR SMALL (A) 11/03/2016 [place] NEGATIVE 11/03/2016 [place] 11/03/2016 1645   PROTEINUR 30 (A) 11/03/2016 1645   NITRITE POSITIVE (A) 11/03/2016 1645   LEUKOCYTESUR LARGE (A) 11/03/2016 1645   Sepsis Labs: _2 (procalcitonin:4,lacticidven:4)  ) Recent Results (from the past 240 hour(s))  MRSA PCR Screening     Status: None   Collection Time: 11/04/16  2:07 AM  Result Value Ref Range Status   MRSA by PCR NEGATIVE NEGATIVE Final    Comment:        The GeneXpert  MRSA Assay (FDA approved for NASAL specimens only), is one component of a comprehensive MRSA colonization surveillance program. It is not intended to diagnose MRSA infection nor to guide or monitor treatment for MRSA infections.          Radiology Studies: Dg Chest 2 View  Result Date: 11/03/2016 CLINICAL DATA:  Altered mental status. EXAM: CHEST  2 VIEW COMPARISON:  10/22/2016 and prior radiographs.  10/01/2016 CT FINDINGS: Cardiomediastinal silhouette is unchanged with moderate to large hiatal hernia again noted. This is a low volume film. There is no evidence of focal airspace disease, pulmonary edema, suspicious pulmonary nodule/mass, pleural effusion, or pneumothorax. No acute bony abnormalities are identified. IMPRESSION: No evidence of acute cardiopulmonary disease. Hiatal hernia. Electronically Signed   By: Margarette Canada M.D.   On: 11/03/2016 17:55   Dg Wrist 2 Views Right  Result Date: 11/03/2016 CLINICAL DATA:  Pain and swelling, no known injury, initial encounter EXAM: RIGHT WRIST - 2 VIEW COMPARISON:  10/27/2016 FINDINGS: Degenerative changes of the first [facility] joint are noted. Calcification of the TFCC is noted. No acute fracture or dislocation is seen. Generalized soft tissue swelling is noted. IMPRESSION: Degenerative changes with associated soft tissue swelling. The overall appearance is stable from the prior exam. Electronically  Signed   By: Inez Catalina M.D.   On: 11/03/2016 21:48   Ct Head Wo Contrast  Result Date: 11/03/2016 CLINICAL DATA:  Altered level consciousness. EXAM: CT HEAD WITHOUT CONTRAST TECHNIQUE: Contiguous axial images were obtained from the base of the skull through the vertex without intravenous contrast. COMPARISON:  02/08/2011 FINDINGS: Brain: There is atrophy and chronic small vessel disease changes. No acute intracranial abnormality. Specifically, no hemorrhage, hydrocephalus, mass lesion, acute infarction, or significant intracranial injury. Vascular: No hyperdense vessel or unexpected calcification. Skull: No acute calvarial abnormality. Sinuses/Orbits: Visualized paranasal sinuses and mastoids clear. Orbital soft tissues unremarkable. Other: None IMPRESSION: No acute intracranial abnormality. Atrophy, chronic microvascular disease. Electronically Signed   By: Rolm Baptise M.D.   On: 11/03/2016 17:43   Ct Renal Stone Study  Result Date: 11/03/2016 CLINICAL DATA:  Abdominal pain.  Recent sepsis. EXAM: CT ABDOMEN AND PELVIS WITHOUT CONTRAST TECHNIQUE: Multidetector CT imaging of the abdomen and pelvis was performed following the standard protocol without IV contrast. COMPARISON:  10/01/2016. FINDINGS: Lower chest: Interval mild right lower lobe atelectasis and minimal left lower lobe atelectasis. There is also interval 1.4 cm oval density in the left lower lobe, adjacent to a large hiatal hernia containing stomach and fat. Hepatobiliary: The liver remains somewhat small. 7 mm gallstone in the gallbladder without gallbladder wall thickening or pericholecystic fluid. Pancreas: Moderate diffuse pancreatic atrophy. Spleen: Normal in size without focal abnormality. Adrenals/Urinary Tract: Tiny mid and lower right renal calculi. The previously demonstrated 8 mm right UPJ calculus is no longer demonstrated. The previously demonstrated right hydronephrosis has resolved. There are mildly prominent bilateral  extrarenal pelves, right greater than left. Moderate atrophy of the left kidney is again noted. No bladder or ureteral calculi are seen. Normal appearing adrenal glands. Stomach/Bowel: Large hiatal hernia containing stomach and fat. Large number of colonic diverticula without evidence of diverticulitis. Normal appearing appendix. Unremarkable small bowel. Vascular/Lymphatic: Atheromatous arterial calcifications without aneurysm. No enlarged lymph nodes. Reproductive: Status post hysterectomy. No adnexal masses. Other: No abdominal wall hernia or abnormality. No abdominopelvic ascites. Musculoskeletal: Right hip prosthesis with associated streak artifacts. Lumbar spine degenerative changes and scoliosis. These include facet degenerative changes with associated grade 1 anterolisthesis at the L4-5  and L5-S1 levels. No pars defects or fractures are seen. IMPRESSION: 1. Interval 1.4 cm oval density in the left lower lobe, adjacent to a large hiatal hernia. This has an appearance compatible with a rounded area of atelectasis or pneumonia. 2. Mild right lower lobe and minimal left lower lobe linear atelectasis. 3. Cholelithiasis without evidence of cholecystitis. 4. The previously demonstrated right UPJ calculus and hydronephrosis are no longer present. 5. Tiny, nonobstructing right renal calculi. 6. Large hiatal hernia containing stomach and fat. 7. Extensive colonic diverticulosis. 8. Stable moderate left renal atrophy. Electronically Signed   By: Claudie Revering M.D.   On: 11/03/2016 21:26        Scheduled Meds: . enoxaparin (LOVENOX) injection  30 mg Subcutaneous Q24H  . latanoprost  1 drop Both Eyes QHS  . terazosin  1 mg Oral QHS  . thiamine  100 mg Intravenous Daily   Continuous Infusions: . ceFEPime (MAXIPIME) IV Stopped (11/04/16 0149)  . fluconazole (DIFLUCAN) IV 400 mg (11/04/16 0518)     LOS: 1 day      Tanzania Hall-Potvin, PA-S [facility] Pager [phone]  If 7PM-7AM, please  contact night-coverage [email or web address] Password [email or web address] 11/04/2016, 11:45 AM

## 2016-11-04 NOTE — Consult Note (Signed)
Physical Medicine and Rehabilitation Consult Reason for Consult: Decreased functional mobility with altered mental status Referring Physician: [facility]   HPI: Caroline Griffith is a 83 y.o. right handed female with history of kidney stones, macular degeneration. Per chart review patient was living alone up until one month ago and was independent still driving. She recently had a personal care attendant to provide assistance at home. She was recently discharged to [facility] 10/27/2016 for failure to thrive after developing kidney stones requiring cystoscopy hospital admission 10/19/2016 to 10/27/2016.Marland Kitchen Findings hemoglobin 8.7. Lactic acid within normal limits. Sedimentation rate 119. Troponin negative. Mildly elevated creatinine 1.15. Cranial CT scan negative. CT renal stone study showed interval 1.4 cm oval density in the left lower lobe adjacent to a large hiatal hernia. Mild right lower lobe a minimal left lower lobe linear atelectasis. Follow-up urine study positive nitrites placed on intravenous cefepime. Anemia felt to be secondary to chronic disease. Subcutaneous Lovenox for DVT prophylaxis. Family had expressed concern of returning back to skilled nursing facility request inpatient rehabilitation services. Physical therapy evaluation completed 11/04/2016 with request for physical medicine rehabilitation consult.   Review of Systems  Constitutional: Negative for chills and fever.  HENT: Negative for hearing loss.   Eyes: Positive for blurred vision. Negative for discharge.  Respiratory: Negative for shortness of breath.   Cardiovascular: Negative for chest pain, palpitations and leg swelling.  Gastrointestinal: Positive for constipation. Negative for nausea and vomiting.  Genitourinary: Positive for urgency. Negative for dysuria, flank pain and hematuria.  Musculoskeletal: Positive for joint pain and myalgias.  Skin: Negative for rash.  Neurological: Positive for weakness. Negative for  seizures.  Psychiatric/Behavioral:       Anxiety  All other systems reviewed and are negative.  Past Medical History:  Diagnosis Date  . Anxiety   . Arthritis   . Chronic kidney disease    kidney stones  . History of hiatal hernia   . History of kidney stones   . Macular degeneration   . Pneumonia    hx. of  . Urolithiasis   . Varicose veins    bilateral LE     Past Surgical History:  Procedure Laterality Date  . ABDOMINAL HYSTERECTOMY    . BUNIONECTOMY Right   . CYSTOSCOPY W/ URETERAL STENT REMOVAL Right 10/26/2016   Procedure: CYSTOSCOPY WITH STENT REMOVAL;  Surgeon: Irine Seal, MD;  Location: [facility];  Service: Urology;  Laterality: Right;  . CYSTOSCOPY WITH STENT PLACEMENT Right 08/15/2014   Procedure: CYSTOSCOPY WITH RIGHT STENT PLACEMENT;  Surgeon: [name], MD;  Location: [facility];  Service: Urology;  Laterality: Right;  . CYSTOSCOPY WITH STENT PLACEMENT Right 10/02/2016   Procedure: CYSTOSCOPY, RIGHT RETROGRADE WITH RIGHT URETERAL STENT PLACEMENT;  Surgeon: Irine Seal, MD;  Location: [facility];  Service: Urology;  Laterality: Right;  . CYSTOSCOPY/URETEROSCOPY/HOLMIUM LASER/STENT PLACEMENT Right 10/19/2016   Procedure: CYSTOSCOPY/URETEROSCOPY/HOLMIUM LASER/STENT EXCHANGE;  Surgeon: Irine Seal, MD;  Location: [facility];  Service: Urology;  Laterality: Right;  ONLY NEEDS 60 MINUTES FOR PROCEDURE  . EXTRACORPOREAL SHOCK WAVE LITHOTRIPSY Right 08/18/2014  . JOINT REPLACEMENT Right    Total hip replacement  . JOINT REPLACEMENT Right    Total knee replacement  . ORIF WRIST FRACTURE Left   . TONSILLECTOMY     Family History  Problem Relation Age of Onset  . Heart disease Mother   . Asthma Mother   . Emphysema Mother        smoked  . Heart disease  Father    Social History:  reports that she quit smoking about 53 years ago. Her smoking use included Cigarettes. She has a 3.50 pack-year smoking history. She has never used smokeless tobacco. She reports that she drinks about  4.2 oz of alcohol per week . She reports that she does not use drugs. Allergies: No Known Allergies Medications Prior to Admission  Medication Sig Dispense Refill  . acetaminophen (TYLENOL) 325 MG tablet Take 325 mg by mouth every 6 (six) hours as needed for moderate pain or headache.    . bisacodyl (DULCOLAX) 5 MG EC tablet Take 1 tablet (5 mg total) by mouth daily as needed for moderate constipation. 30 tablet 0  . dronabinol (MARINOL) 2.5 MG capsule Take 1 capsule (2.5 mg total) by mouth 2 (two) times daily before lunch and supper. 30 capsule 0  . feeding supplement, ENSURE ENLIVE, (ENSURE ENLIVE) LIQD Take 237 mLs by mouth 3 (three) times daily between meals. 237 mL 12  . fluconazole (DIFLUCAN) 40 MG/ML suspension Take 10 mLs (400 mg total) by mouth daily. 180 mL 0  . latanoprost (XALATAN) 0.005 % ophthalmic solution PLACE 1 DROP INTO BOTH EYES ONCE DAILY  12  . loperamide (IMODIUM) 2 MG capsule Take 2 mg by mouth as needed for diarrhea or loose stools.    Marland Kitchen LORazepam (ATIVAN) 0.5 MG tablet Take 0.5 mg by mouth 2 (two) times daily as needed for anxiety.    . mirtazapine (REMERON SOL-TAB) 15 MG disintegrating tablet Take 1 tablet (15 mg total) by mouth at bedtime. 30 tablet 0  . polyethylene glycol (MIRALAX / GLYCOLAX) packet Take 17 g by mouth daily as needed for mild constipation. 14 each 0  . senna-docusate (SENOKOT-S) 8.6-50 MG tablet Take 1 tablet by mouth at bedtime as needed for mild constipation. 30 tablet 11  . terazosin (HYTRIN) 1 MG capsule Take 1 capsule (1 mg total) by mouth at bedtime. 30 capsule 0  . traMADol (ULTRAM) 50 MG tablet Take 1-2 tablets (50-100 mg total) by mouth every 6 (six) hours as needed for moderate pain or severe pain. 30 tablet 0    Home: Home Living Family/patient expects to be discharged to:: Unsure Living Arrangements: Alone Available Help at Discharge: Personal care attendant, Available 24 hours/day (dtr reports they have hired help that can be  24/7) Type of Home: House Home Access: Stairs to enter [employer] of Steps: 4 Entrance Stairs-Rails: Right, Left Home Layout: One level Bathroom Shower/Tub: Multimedia programmer: Standard Home Equipment: Environmental consultant - 2 wheels, [place] - single point Additional Comments: pt recently d/c'd to [facility] on 11-22-22. Dtr reports "over my dead body" when asked if the plan was to return there..  Functional History: Prior Function Level of Independence: Needs assistance Gait / Transfers Assistance Needed: pt has been in bed for the last week., prior to last hospital admission pt was indep and driving ADL's / Homemaking Assistance Needed: needed help from SNF staff Functional Status:  Mobility: Bed Mobility Overal bed mobility: Needs Assistance Bed Mobility: Supine to Sit Supine to sit: Max assist General bed mobility comments: pt able to bring LEs off to EOB, maxA for trunk elevation and to scoot hips to EOB Transfers Overall transfer level: Needs assistance Equipment used:  (1 person lift from the front with gait belt) Transfers: Sit to/from Stand, Stand Pivot Transfers Sit to Stand: Max assist Stand pivot transfers: Max assist General transfer comment: attempted to use RW however pt impulsively sat down due  to starting to urinate. for safety PT performed transfer with gait belt from the front. pt complete std pvt to [facility] and then to chair. max v/c's for sequencing steps and to keep hands on PT as pt would not let go of BSC, increased time, unable to get completely upright Ambulation/Gait General Gait Details: did not attempt today    ADL:    Cognition: Cognition Overall Cognitive Status: Impaired/Different from baseline Orientation Level: Oriented to person Cognition Arousal/Alertness: Awake/alert Behavior During Therapy: WFL for tasks assessed/performed Overall Cognitive Status: Impaired/Different from baseline Area of Impairment: Orientation, Problem solving,  Safety/judgement, Following commands Orientation Level: Disoriented to, Situation, Place (stated [facility], but able to recall after reorien) Following Commands: Follows one step commands with increased time Safety/Judgement: Decreased awareness of safety, Decreased awareness of deficits Problem Solving: Slow processing, Difficulty sequencing, Requires verbal cues, Requires tactile cues General Comments: per dtr pt more talkative and alert today and able to hold a converstation. per dtr yesterday she couldn't talk or stay awake  Blood pressure (!) 143/63, pulse 95, temperature 98.5 F (36.9 C), temperature source Oral, resp. rate 19, height 5' 6.5" (1.689 m), weight 72.6 kg (160 lb), SpO2 97 %. Physical Exam  Vitals reviewed. Constitutional: She is oriented to person, place, and time.  HENT:  Head: Normocephalic.  Eyes: EOM are normal.  Neck: Normal range of motion. Neck supple. No thyromegaly present.  Cardiovascular: Normal rate, regular rhythm and normal heart sounds.   Respiratory: Effort normal and breath sounds normal. No respiratory distress.  GI: Soft. Bowel sounds are normal. She exhibits no distension.  Neurological: She is alert and oriented to person, place, and time.  CN exam intact. UE 4/5 prox to distal. LE: 2 to 2+/5 HF, 3/5KE and 4- ADF/PF. No gross sensory deficits. Decreased STM. Oriented to month and place. Struggled with simple biographical information  Skin: Skin is warm and dry.  Right TKA and THA  Psychiatric:  Flat but cooperative    Results for orders placed or performed during the hospital encounter of 11/03/16 (from the past 24 hour(s))  Ammonia     Status: None   Collection Time: 11/03/16  4:24 PM  Result Value Ref Range   Ammonia 22 9 - 35 umol/L  Comprehensive metabolic panel     Status: Abnormal   Collection Time: 11/03/16  4:24 PM  Result Value Ref Range   Sodium 138 135 - 145 mmol/L   Potassium 3.8 3.5 - 5.1 mmol/L   Chloride 99 (L) 101  - 111 mmol/L   CO2 30 22 - 32 mmol/L   Glucose, Bld 108 (H) 65 - 99 mg/dL   BUN 15 6 - 20 mg/dL   Creatinine, Ser 1.15 (H) 0.44 - 1.00 mg/dL   Calcium 8.7 (L) 8.9 - 10.3 mg/dL   Total Protein 6.4 (L) 6.5 - 8.1 g/dL   Albumin 2.3 (L) 3.5 - 5.0 g/dL   AST 35 15 - 41 U/L   ALT 22 14 - 54 U/L   Alkaline Phosphatase 181 (H) 38 - 126 U/L   Total Bilirubin 1.3 (H) 0.3 - 1.2 mg/dL   GFR calc non Af Amer 42 (L) >60 mL/min   GFR calc Af Amer 49 (L) >60 mL/min   Anion gap 9 5 - 15  Ethanol     Status: None   Collection Time: 11/03/16  4:24 PM  Result Value Ref Range   Alcohol, Ethyl (B) <10 <10 mg/dL  CBC WITH DIFFERENTIAL  Status: Abnormal   Collection Time: 11/03/16  4:24 PM  Result Value Ref Range   WBC 10.3 4.0 - 10.5 K/uL   RBC 3.47 (L) 3.87 - 5.11 MIL/uL   Hemoglobin 10.1 (L) 12.0 - 15.0 g/dL   HCT 32.3 (L) 36.0 - 46.0 %   MCV 93.1 78.0 - 100.0 fL   MCH 29.1 26.0 - 34.0 pg   MCHC 31.3 30.0 - 36.0 g/dL   RDW 14.4 11.5 - 15.5 %   Platelets 493 (H) 150 - 400 K/uL   Neutrophils Relative % 74 %   Neutro Abs 7.7 1.7 - 7.7 K/uL   Lymphocytes Relative 13 %   Lymphs Abs 1.4 0.7 - 4.0 K/uL   Monocytes Relative 10 %   Monocytes Absolute 1.0 0.1 - 1.0 K/uL   Eosinophils Relative 2 %   Eosinophils Absolute 0.2 0.0 - 0.7 K/uL   Basophils Relative 1 %   Basophils Absolute 0.1 0.0 - 0.1 K/uL  Urinalysis, Routine w reflex microscopic (not at [facility])     Status: Abnormal   Collection Time: 11/03/16  4:45 PM  Result Value Ref Range   Color, Urine AMBER (A) YELLOW   APPearance HAZY (A) CLEAR   Specific Gravity, Urine 1.016 1.005 - 1.030   pH 5.0 5.0 - 8.0   Glucose, UA NEGATIVE NEGATIVE mg/dL   Hgb urine dipstick SMALL (A) NEGATIVE   Bilirubin Urine NEGATIVE NEGATIVE   Ketones, ur NEGATIVE NEGATIVE mg/dL   Protein, ur 30 (A) NEGATIVE mg/dL   Nitrite POSITIVE (A) NEGATIVE   Leukocytes, UA LARGE (A) NEGATIVE   RBC / HPF 0-5 0 - 5 RBC/hpf   WBC, UA TOO NUMEROUS TO COUNT 0 - 5 WBC/hpf    Bacteria, UA MANY (A) NONE SEEN   Squamous Epithelial / LPF 0-5 (A) NONE SEEN  CBG monitoring, ED     Status: Abnormal   Collection Time: 11/03/16  5:12 PM  Result Value Ref Range   Glucose-Capillary 103 (H) 65 - 99 mg/dL  I-stat troponin, ED     Status: None   Collection Time: 11/03/16  5:17 PM  Result Value Ref Range   Troponin i, poc 0.03 0.00 - 0.08 ng/mL   Comment 3          I-Stat Chem 8, ED     Status: Abnormal   Collection Time: 11/03/16  5:18 PM  Result Value Ref Range   Sodium 139 135 - 145 mmol/L   Potassium 3.9 3.5 - 5.1 mmol/L   Chloride 99 (L) 101 - 111 mmol/L   BUN 17 6 - 20 mg/dL   Creatinine, Ser 1.00 0.44 - 1.00 mg/dL   Glucose, Bld 106 (H) 65 - 99 mg/dL   Calcium, Ion 1.08 (L) 1.15 - 1.40 mmol/L   TCO2 29 22 - 32 mmol/L   Hemoglobin 10.5 (L) 12.0 - 15.0 g/dL   HCT 31.0 (L) 36.0 - 46.0 %  I-Stat CG4 Lactic Acid, ED     Status: None   Collection Time: 11/03/16  5:19 PM  Result Value Ref Range   Lactic Acid, Venous 1.36 0.5 - 1.9 mmol/L  I-Stat arterial blood gas, ED     Status: Abnormal   Collection Time: 11/03/16  5:21 PM  Result Value Ref Range   pH, Arterial 7.475 (H) 7.350 - 7.450   pCO2 arterial 43.8 32.0 - 48.0 mmHg   pO2, Arterial 42.0 (L) 83.0 - 108.0 mmHg   Bicarbonate 32.2 (H) 20.0 - 28.0 mmol/L  TCO2 34 (H) 22 - 32 mmol/L   O2 Saturation 80.0 %   Acid-Base Excess 8.0 (H) 0.0 - 2.0 mmol/L   Patient temperature HIDE    Sample type ARTERIAL   Uric acid     Status: None   Collection Time: 11/03/16 11:28 PM  Result Value Ref Range   Uric Acid, Serum 4.6 2.3 - 6.6 mg/dL  C-reactive protein     Status: Abnormal   Collection Time: 11/03/16 11:28 PM  Result Value Ref Range   CRP 20.9 (H) <1.0 mg/dL  CBC with Differential     Status: Abnormal   Collection Time: 11/03/16 11:28 PM  Result Value Ref Range   WBC 7.5 4.0 - 10.5 K/uL   RBC 3.00 (L) 3.87 - 5.11 MIL/uL   Hemoglobin 8.8 (L) 12.0 - 15.0 g/dL   HCT 27.9 (L) 36.0 - 46.0 %   MCV 93.0 78.0  - 100.0 fL   MCH 29.3 26.0 - 34.0 pg   MCHC 31.5 30.0 - 36.0 g/dL   RDW 14.1 11.5 - 15.5 %   Platelets 453 (H) 150 - 400 K/uL   Neutrophils Relative % 64 %   Neutro Abs 4.8 1.7 - 7.7 K/uL   Lymphocytes Relative 21 %   Lymphs Abs 1.6 0.7 - 4.0 K/uL   Monocytes Relative 9 %   Monocytes Absolute 0.7 0.1 - 1.0 K/uL   Eosinophils Relative 5 %   Eosinophils Absolute 0.3 0.0 - 0.7 K/uL   Basophils Relative 1 %   Basophils Absolute 0.1 0.0 - 0.1 K/uL  Comprehensive metabolic panel     Status: Abnormal   Collection Time: 11/03/16 11:28 PM  Result Value Ref Range   Sodium 140 135 - 145 mmol/L   Potassium 3.4 (L) 3.5 - 5.1 mmol/L   Chloride 106 101 - 111 mmol/L   CO2 26 22 - 32 mmol/L   Glucose, Bld 92 65 - 99 mg/dL   BUN 12 6 - 20 mg/dL   Creatinine, Ser 1.03 (H) 0.44 - 1.00 mg/dL   Calcium 8.0 (L) 8.9 - 10.3 mg/dL   Total Protein 5.8 (L) 6.5 - 8.1 g/dL   Albumin 1.9 (L) 3.5 - 5.0 g/dL   AST 29 15 - 41 U/L   ALT 20 14 - 54 U/L   Alkaline Phosphatase 160 (H) 38 - 126 U/L   Total Bilirubin 1.2 0.3 - 1.2 mg/dL   GFR calc non Af Amer 49 (L) >60 mL/min   GFR calc Af Amer 56 (L) >60 mL/min   Anion gap 8 5 - 15  Lactic acid, plasma     Status: None   Collection Time: 11/03/16 11:28 PM  Result Value Ref Range   Lactic Acid, Venous 0.9 0.5 - 1.9 mmol/L  Procalcitonin     Status: None   Collection Time: 11/03/16 11:28 PM  Result Value Ref Range   Procalcitonin 0.75 ng/mL  MRSA PCR Screening     Status: None   Collection Time: 11/04/16  2:07 AM  Result Value Ref Range   MRSA by PCR NEGATIVE NEGATIVE  Sedimentation rate     Status: Abnormal   Collection Time: 11/04/16  2:40 AM  Result Value Ref Range   Sed Rate 119 (H) 0 - 22 mm/hr  Prealbumin     Status: Abnormal   Collection Time: 11/04/16  2:40 AM  Result Value Ref Range   Prealbumin 5.2 (L) 18 - 38 mg/dL  Phosphorus     Status: None  Collection Time: 11/04/16  2:40 AM  Result Value Ref Range   Phosphorus 3.2 2.5 - 4.6 mg/dL    TSH     Status: None   Collection Time: 11/04/16  2:40 AM  Result Value Ref Range   TSH 1.200 0.350 - 4.500 uIU/mL  Lactic acid, plasma     Status: None   Collection Time: 11/04/16  2:40 AM  Result Value Ref Range   Lactic Acid, Venous 0.7 0.5 - 1.9 mmol/L  CBC     Status: Abnormal   Collection Time: 11/04/16  5:12 AM  Result Value Ref Range   WBC 6.6 4.0 - 10.5 K/uL   RBC 3.03 (L) 3.87 - 5.11 MIL/uL   Hemoglobin 8.7 (L) 12.0 - 15.0 g/dL   HCT 28.3 (L) 36.0 - 46.0 %   MCV 93.4 78.0 - 100.0 fL   MCH 28.7 26.0 - 34.0 pg   MCHC 30.7 30.0 - 36.0 g/dL   RDW 14.2 11.5 - 15.5 %   Platelets 433 (H) 150 - 400 K/uL  Comprehensive metabolic panel     Status: Abnormal   Collection Time: 11/04/16  5:12 AM  Result Value Ref Range   Sodium 141 135 - 145 mmol/L   Potassium 3.8 3.5 - 5.1 mmol/L   Chloride 106 101 - 111 mmol/L   CO2 25 22 - 32 mmol/L   Glucose, Bld 91 65 - 99 mg/dL   BUN 11 6 - 20 mg/dL   Creatinine, Ser 0.92 0.44 - 1.00 mg/dL   Calcium 7.9 (L) 8.9 - 10.3 mg/dL   Total Protein 5.4 (L) 6.5 - 8.1 g/dL   Albumin 1.8 (L) 3.5 - 5.0 g/dL   AST 25 15 - 41 U/L   ALT 18 14 - 54 U/L   Alkaline Phosphatase 148 (H) 38 - 126 U/L   Total Bilirubin 1.1 0.3 - 1.2 mg/dL   GFR calc non Af Amer 56 (L) >60 mL/min   GFR calc Af Amer >60 >60 mL/min   Anion gap 10 5 - 15  Magnesium     Status: None   Collection Time: 11/04/16  5:12 AM  Result Value Ref Range   Magnesium 1.7 1.7 - 2.4 mg/dL   Dg Chest 2 View  Result Date: 11/03/2016 CLINICAL DATA:  Altered mental status. EXAM: CHEST  2 VIEW COMPARISON:  10/22/2016 and prior radiographs.  10/01/2016 CT FINDINGS: Cardiomediastinal silhouette is unchanged with moderate to large hiatal hernia again noted. This is a low volume film. There is no evidence of focal airspace disease, pulmonary edema, suspicious pulmonary nodule/mass, pleural effusion, or pneumothorax. No acute bony abnormalities are identified. IMPRESSION: No evidence of acute  cardiopulmonary disease. Hiatal hernia. Electronically Signed   By: Margarette Canada M.D.   On: 11/03/2016 17:55   Dg Wrist 2 Views Right  Result Date: 11/03/2016 CLINICAL DATA:  Pain and swelling, no known injury, initial encounter EXAM: RIGHT WRIST - 2 VIEW COMPARISON:  10/27/2016 FINDINGS: Degenerative changes of the first [facility] joint are noted. Calcification of the TFCC is noted. No acute fracture or dislocation is seen. Generalized soft tissue swelling is noted. IMPRESSION: Degenerative changes with associated soft tissue swelling. The overall appearance is stable from the prior exam. Electronically Signed   By: Inez Catalina M.D.   On: 11/03/2016 21:48   Ct Head Wo Contrast  Result Date: 11/03/2016 CLINICAL DATA:  Altered level consciousness. EXAM: CT HEAD WITHOUT CONTRAST TECHNIQUE: Contiguous axial images were obtained from the base of the  skull through the vertex without intravenous contrast. COMPARISON:  02/08/2011 FINDINGS: Brain: There is atrophy and chronic small vessel disease changes. No acute intracranial abnormality. Specifically, no hemorrhage, hydrocephalus, mass lesion, acute infarction, or significant intracranial injury. Vascular: No hyperdense vessel or unexpected calcification. Skull: No acute calvarial abnormality. Sinuses/Orbits: Visualized paranasal sinuses and mastoids clear. Orbital soft tissues unremarkable. Other: None IMPRESSION: No acute intracranial abnormality. Atrophy, chronic microvascular disease. Electronically Signed   By: Rolm Baptise M.D.   On: 11/03/2016 17:43   Ct Renal Stone Study  Result Date: 11/03/2016 CLINICAL DATA:  Abdominal pain.  Recent sepsis. EXAM: CT ABDOMEN AND PELVIS WITHOUT CONTRAST TECHNIQUE: Multidetector CT imaging of the abdomen and pelvis was performed following the standard protocol without IV contrast. COMPARISON:  10/01/2016. FINDINGS: Lower chest: Interval mild right lower lobe atelectasis and minimal left lower lobe atelectasis. There is  also interval 1.4 cm oval density in the left lower lobe, adjacent to a large hiatal hernia containing stomach and fat. Hepatobiliary: The liver remains somewhat small. 7 mm gallstone in the gallbladder without gallbladder wall thickening or pericholecystic fluid. Pancreas: Moderate diffuse pancreatic atrophy. Spleen: Normal in size without focal abnormality. Adrenals/Urinary Tract: Tiny mid and lower right renal calculi. The previously demonstrated 8 mm right UPJ calculus is no longer demonstrated. The previously demonstrated right hydronephrosis has resolved. There are mildly prominent bilateral extrarenal pelves, right greater than left. Moderate atrophy of the left kidney is again noted. No bladder or ureteral calculi are seen. Normal appearing adrenal glands. Stomach/Bowel: Large hiatal hernia containing stomach and fat. Large number of colonic diverticula without evidence of diverticulitis. Normal appearing appendix. Unremarkable small bowel. Vascular/Lymphatic: Atheromatous arterial calcifications without aneurysm. No enlarged lymph nodes. Reproductive: Status post hysterectomy. No adnexal masses. Other: No abdominal wall hernia or abnormality. No abdominopelvic ascites. Musculoskeletal: Right hip prosthesis with associated streak artifacts. Lumbar spine degenerative changes and scoliosis. These include facet degenerative changes with associated grade 1 anterolisthesis at the L4-5 and L5-S1 levels. No pars defects or fractures are seen. IMPRESSION: 1. Interval 1.4 cm oval density in the left lower lobe, adjacent to a large hiatal hernia. This has an appearance compatible with a rounded area of atelectasis or pneumonia. 2. Mild right lower lobe and minimal left lower lobe linear atelectasis. 3. Cholelithiasis without evidence of cholecystitis. 4. The previously demonstrated right UPJ calculus and hydronephrosis are no longer present. 5. Tiny, nonobstructing right renal calculi. 6. Large hiatal hernia  containing stomach and fat. 7. Extensive colonic diverticulosis. 8. Stable moderate left renal atrophy. Electronically Signed   By: Claudie Revering M.D.   On: 11/03/2016 21:26    Assessment/Plan: Diagnosis: Debility and metabolic encephalopathy due to multiple medical issues above 1. Does the need for close, 24 hr/day medical supervision in concert with the patient's rehab needs make it unreasonable for this patient to be served in a less intensive setting? Yes 2. Co-Morbidities requiring supervision/potential complications: UTI, AKI, HCAP 3. Due to bladder management, bowel management, safety, skin/wound care, disease management, medication administration, pain management and patient education, does the patient require 24 hr/day rehab nursing? Yes 4. Does the patient require coordinated care of a physician, rehab nurse, PT (1-2 hrs/day, 5 days/week), OT (1-2 hrs/day, 5 days/week) and SLP (1-2 hrs/day, 5 days/week) to address physical and functional deficits in the context of the above medical diagnosis(es)? Yes Addressing deficits in the following areas: balance, endurance, locomotion, strength, transferring, bowel/bladder control, bathing, dressing, feeding, grooming, toileting, cognition, swallowing and psychosocial support 5. Can the  patient actively participate in an intensive therapy program of at least 3 hrs of therapy per day at least 5 days per week? Yes 6. The potential for patient to make measurable gains while on inpatient rehab is excellent 7. Anticipated functional outcomes upon discharge from inpatient rehab are modified independent and supervision  with PT, modified independent and supervision with OT, modified independent and supervision with SLP. 8. Estimated rehab length of stay to reach the above functional goals is: 13-19 days 9. Anticipated D/C setting: Home 10. Anticipated post D/C treatments: HH therapy and Outpatient therapy 11. Overall Rehab/Functional Prognosis:  excellent  RECOMMENDATIONS: This patient's condition is appropriate for continued rehabilitative care in the following setting: CIR Patient has agreed to participate in recommended program. Yes Note that insurance prior authorization may be required for reimbursement for recommended care.  Comment: Rehab Admissions Coordinator to follow up.  Thanks,  Meredith Staggers, MD, Mellody Drown    Cathlyn Parsons., PA-C 11/04/2016

## 2016-11-04 NOTE — Progress Notes (Signed)
OT Cancellation Note  Patient Details Name: Caroline Griffith MRN: [id number] DOB: February 15, 1930   Cancelled Treatment:    Reason Eval/Treat Not Completed: Other (comment). Pt is [facility] and plan is to return to SNF. Will defer any OT needs to SNF. If eval needed, please call [phone]. Thanks  [place], OT/L  [phone] 11/04/2016 11/04/2016, 8:33 AM

## 2016-11-04 NOTE — Evaluation (Signed)
Objective Swallowing Evaluation: Type of Study: MBS-Modified Barium Swallow Study  Patient Details  Name: Caroline Griffith MRN: [id number] Date of Birth: 03-21-1930  Today's Date: 11/04/2016 Time: SLP Start Time (ACUTE ONLY): 1044-SLP Stop Time (ACUTE ONLY): 1111 SLP Time Calculation (min) (ACUTE ONLY): 27 min  Past Medical History:  Past Medical History:  Diagnosis Date  . Anxiety   . Arthritis   . Chronic kidney disease    kidney stones  . History of hiatal hernia   . History of kidney stones   . Macular degeneration   . Pneumonia    hx. of  . Urolithiasis   . Varicose veins    bilateral LE     Past Surgical History:  Past Surgical History:  Procedure Laterality Date  . ABDOMINAL HYSTERECTOMY    . BUNIONECTOMY Right   . CYSTOSCOPY W/ URETERAL STENT REMOVAL Right 10/26/2016   Procedure: CYSTOSCOPY WITH STENT REMOVAL;  Surgeon: Irine Seal, MD;  Location: [facility];  Service: Urology;  Laterality: Right;  . CYSTOSCOPY WITH STENT PLACEMENT Right 08/15/2014   Procedure: CYSTOSCOPY WITH RIGHT STENT PLACEMENT;  Surgeon: [name], MD;  Location: [facility];  Service: Urology;  Laterality: Right;  . CYSTOSCOPY WITH STENT PLACEMENT Right 10/02/2016   Procedure: CYSTOSCOPY, RIGHT RETROGRADE WITH RIGHT URETERAL STENT PLACEMENT;  Surgeon: Irine Seal, MD;  Location: [facility];  Service: Urology;  Laterality: Right;  . CYSTOSCOPY/URETEROSCOPY/HOLMIUM LASER/STENT PLACEMENT Right 10/19/2016   Procedure: CYSTOSCOPY/URETEROSCOPY/HOLMIUM LASER/STENT EXCHANGE;  Surgeon: Irine Seal, MD;  Location: [facility];  Service: Urology;  Laterality: Right;  ONLY NEEDS 60 MINUTES FOR PROCEDURE  . EXTRACORPOREAL SHOCK WAVE LITHOTRIPSY Right 08/18/2014  . JOINT REPLACEMENT Right    Total hip replacement  . JOINT REPLACEMENT Right    Total knee replacement  . ORIF WRIST FRACTURE Left   . TONSILLECTOMY     HPI: Caroline Griffith is a 83 y.o. female with medical history significant of kidney stones, macular  degeneration, varicose veins, OA admitted for confusion, weakness, and FTT. Previous BSE on 10/15 reports poor appetite with no signs of oropharyngeal or esophageal concerns. Esophogram on 07/08/2005 revealed large hiatal herneria without stricture or reflux and mild decrease in esophageal peristalsis. CXR 10/24 clear.   Subjective: pt awake in bed, son Caroline Griffith present   Assessment / Plan / Recommendation  CHL IP CLINICAL IMPRESSIONS 11/04/2016  Clinical Impression Pt demonstrates mild oral dysphagia marked by piecemeal bolus pattern, decreased tongue based retraction leading to intermittent and mild residue. Moderate-severe motor based pharyngeal dysphagia leading to incomplete epiglottic inversion resulting in penetration during the swallow of thin to the cords with delayed reflexive cough. Additional aspiration present with chin tuck strategy. Inconsistent flash laryngeal penetration noted with nectar-thick barium. Residue in the valleculae due to poor epiglottic deflection and lingual residue. Pt required approximately 5-6 swallows in order to clear pharynx of small single sips likely due to suspected insufficient UES opening for passage of entire bolus into esophagus. Moderate-severe pyriform residue consistent throughout study, likely caused by decreased pharyngeal constriction and inability to clear despite attempts of liquid wash and verbal cues for effortful swallows. Cricopharyngeal bar noted. Esophageal sweep performed; did not note overt abnormalities (MBS does not diagnose below the level of the UES). With history of esophageal dysfunction, further esophageal workup may be warranted.  Pt may benefit from RMST for potential improvement in laryngeal strength for swallowing; CIR suggested. Recommend Dys 3 (mech soft) diet with nectar-thick liquids, multiple swallows, alternate liquids and solids, meds crushed in  puree and NO STRAWS. Will continue ST intervention for dysphagia management.   SLP Visit  Diagnosis Dysphagia, oropharyngeal phase (R13.12)  Attention and concentration deficit following --  Frontal lobe and executive function deficit following --  Impact on safety and function Moderate aspiration risk;Severe aspiration risk      CHL IP TREATMENT RECOMMENDATION 11/04/2016  Treatment Recommendations Therapy as outlined in treatment plan below     Prognosis 11/04/2016  Prognosis for Safe Diet Advancement Fair  Barriers to Reach Goals Severity of deficits  Barriers/Prognosis Comment --    CHL IP DIET RECOMMENDATION 11/04/2016  SLP Diet Recommendations Dysphagia 3 (Mech soft) solids;Nectar thick liquid  Liquid Administration via Cup  Medication Administration Crushed with puree  Compensations Slow rate;Small sips/bites;Multiple dry swallows after each bite/sip;Other (Comment)  Postural Changes Remain semi-upright after after feeds/meals (Comment);Seated upright at 90 degrees      CHL IP OTHER RECOMMENDATIONS 11/04/2016  Recommended Consults Consider esophageal assessment  Oral Care Recommendations Oral care BID  Other Recommendations Order thickener from pharmacy      CHL IP FOLLOW UP RECOMMENDATIONS 11/04/2016  Follow up Recommendations Inpatient Rehab      CHL IP FREQUENCY AND DURATION 11/04/2016  Speech Therapy Frequency (ACUTE ONLY) min 2x/week  Treatment Duration 2 weeks           CHL IP ORAL PHASE 11/04/2016  Oral Phase Impaired  Oral - Pudding Teaspoon --  Oral - Pudding Cup --  Oral - Honey Teaspoon --  Oral - Honey Cup --  Oral - Nectar Teaspoon --  Oral - Nectar Cup Piecemeal swallowing;Lingual/palatal residue  Oral - Nectar Straw --  Oral - Thin Teaspoon --  Oral - Thin Cup Piecemeal swallowing;Lingual/palatal residue  Oral - Thin Straw --  Oral - Puree --  Oral - Mech Soft --  Oral - Regular Piecemeal swallowing;Lingual/palatal residue  Oral - Multi-Consistency --  Oral - Pill --  Oral Phase - Comment --    CHL IP PHARYNGEAL PHASE  11/04/2016  Pharyngeal Phase Impaired  Pharyngeal- Pudding Teaspoon --  Pharyngeal --  Pharyngeal- Pudding Cup --  Pharyngeal --  Pharyngeal- Honey Teaspoon --  Pharyngeal --  Pharyngeal- Honey Cup --  Pharyngeal --  Pharyngeal- Nectar Teaspoon --  Pharyngeal --  Pharyngeal- Nectar Cup Penetration/Aspiration during swallow;Reduced pharyngeal peristalsis;Reduced epiglottic inversion;Reduced tongue base retraction;Pharyngeal residue - valleculae;Pharyngeal residue - pyriform;Pharyngeal residue - cp segment  Pharyngeal Material enters airway, CONTACTS cords and then ejected out  Pharyngeal- Nectar Straw --  Pharyngeal --  Pharyngeal- Thin Teaspoon --  Pharyngeal --  Pharyngeal- Thin Cup Reduced airway/laryngeal closure;Reduced pharyngeal peristalsis;Reduced epiglottic inversion;Reduced tongue base retraction;Penetration/Aspiration during swallow;Pharyngeal residue - valleculae;Pharyngeal residue - pyriform;Pharyngeal residue - cp segment;Compensatory strategies attempted (with notebox)  Pharyngeal Material enters airway, passes BELOW cords and not ejected out despite cough attempt by patient  Pharyngeal- Thin Straw --  Pharyngeal --  Pharyngeal- Puree --  Pharyngeal --  Pharyngeal- Mechanical Soft --  Pharyngeal --  Pharyngeal- Regular Reduced pharyngeal peristalsis;Reduced epiglottic inversion;Reduced tongue base retraction;Pharyngeal residue - valleculae;Pharyngeal residue - pyriform;Pharyngeal residue - cp segment  Pharyngeal --  Pharyngeal- Multi-consistency --  Pharyngeal --  Pharyngeal- Pill --  Pharyngeal --  Pharyngeal Comment --     No flowsheet data found.  No flowsheet data found.  Aaron Edelman, Student SLP 11/04/2016, 3:22 PM

## 2016-11-05 LAB — LEGIONELLA PNEUMOPHILA SEROGP 1 UR AG: L. pneumophila Serogp 1 Ur Ag: NEGATIVE

## 2016-11-05 LAB — ANA W/REFLEX IF POSITIVE: ANA: NEGATIVE

## 2016-11-05 MED ORDER — CIPROFLOXACIN IN D5W 400 MG/200ML IV SOLN
400.0000 mg | Freq: Two times a day (BID) | INTRAVENOUS | Status: DC
  Administered 2016-11-05 – 2016-11-08 (×7): 400 mg via INTRAVENOUS
  Filled 2016-11-05 (×7): qty 200

## 2016-11-05 MED ORDER — RESOURCE THICKENUP CLEAR PO POWD
ORAL | Status: DC | PRN
  Filled 2016-11-05: qty 125

## 2016-11-05 NOTE — Progress Notes (Addendum)
Physical Therapy Treatment Patient Details Name: Caroline Griffith MRN: [id number] DOB: Mar 24, 1930 Today's Date: 11/05/2016    History of Present Illness Caroline Griffith is a 83 y.o. female with medical history significant of kidney stones, macular degeneration, varicose veins, OA. admitted for confusion, weakness, and FTT.    PT Comments    Pt progressing well with mobility. Max assist for supine to sit. +2 mod assist transfers and +2 mod assist ambulation 3 feet with RW. Pt very motivated to participate in therapy. Pt is an excellent CIR candidate.   Follow Up Recommendations  CIR     Equipment Recommendations  Other (comment) (TBD)    Recommendations for Other Services       Precautions / Restrictions Precautions Precautions: Fall;Other (comment) Precaution Comments: incontinent     Mobility  Bed Mobility         Supine to sit: Max assist;HOB elevated     General bed mobility comments: verbal cues for sequencing. Pt able to initiate elevating trunk from bed. Assist needed to come to full upright sitting. Use of bed pad to scoot to EOB.  Transfers   Equipment used: Rolling walker (2 wheeled)   Sit to Stand: +2 safety/equipment;Mod assist Stand pivot transfers: +2 physical assistance;Mod assist       General transfer comment: Multi attempts and increased time to power up. Verbal cues for hand placement.  Ambulation/Gait Ambulation/Gait assistance: Mod assist;+2 physical assistance Ambulation Distance (Feet): 3 Feet Assistive device: Rolling walker (2 wheeled) Gait Pattern/deviations: Step-through pattern;Decreased stride length Gait velocity: decreased Gait velocity interpretation: Below normal speed for age/gender General Gait Details: assist with lateral weight shifts to initiate swing phase LEs. Assist to maintain balance in stance.   Stairs            Wheelchair Mobility    Modified Rankin (Stroke Patients Only)       Balance    Sitting-balance support: No upper extremity supported;Feet supported Sitting balance-Leahy Scale: Fair     Standing balance support: Bilateral upper extremity supported;During functional activity Standing balance-Leahy Scale: Poor Standing balance comment: dependent on physical assist                            Cognition Arousal/Alertness: Awake/alert Behavior During Therapy: WFL for tasks assessed/performed Overall Cognitive Status: Impaired/Different from baseline                   Orientation Level: Disoriented to;Situation Current Attention Level: Selective Memory: Decreased short-term memory Following Commands: Follows one step commands with increased time Safety/Judgement: Decreased awareness of safety;Decreased awareness of deficits   Problem Solving: Slow processing;Difficulty sequencing;Requires verbal cues;Requires tactile cues        Exercises General Exercises - Lower Extremity Ankle Circles/Pumps: AROM;Both;20 reps Quad Sets: AROM;Both;10 reps Heel Slides: AROM;Right;Left;AAROM;10 reps Hip ABduction/ADduction: AROM;Right;Left;10 reps    General Comments        Pertinent Vitals/Pain Pain Assessment: Faces Faces Pain Scale: Hurts a little bit Pain Location: "sore all over" Pain Descriptors / Indicators: Sore;Grimacing Pain Intervention(s): Monitored during session;Repositioned    Home Living                      Prior Function            PT Goals (current goals can now be found in the care plan section) Acute Rehab PT Goals Patient Stated Goal: rehab PT Goal Formulation: With patient/family Time For Goal  Achievement: 11/11/16 Potential to Achieve Goals: Good Progress towards PT goals: Progressing toward goals    Frequency    Min 3X/week      PT Plan Current plan remains appropriate    Co-evaluation              AM-PAC PT "6 Clicks" Daily Activity  Outcome Measure  Difficulty turning over in bed  (including adjusting bedclothes, sheets and blankets)?: Unable Difficulty moving from lying on back to sitting on the side of the bed? : Unable Difficulty sitting down on and standing up from a chair with arms (e.g., wheelchair, bedside commode, etc,.)?: Unable Help needed moving to and from a bed to chair (including a wheelchair)?: A Lot Help needed walking in hospital room?: A Lot Help needed climbing 3-5 steps with a railing? : Total 6 Click Score: 8    End of Session Equipment Utilized During Treatment: Gait belt Activity Tolerance: Patient tolerated treatment well Patient left: in chair;with call bell/phone within reach;with chair alarm set;with family/visitor present Nurse Communication: Mobility status PT Visit Diagnosis: Muscle weakness (generalized) (M62.81);Difficulty in walking, not elsewhere classified (R26.2)     Time: 7408-1448 PT Time Calculation (min) (ACUTE ONLY): 32 min  Charges:  $Gait Training: 8-22 mins $Therapeutic Exercise: 8-22 mins                    G Codes:       Caroline Griffith, PT  Office # [phone] Pager [phone]    Caroline Griffith 11/05/2016, 12:45 PM

## 2016-11-05 NOTE — Progress Notes (Signed)
Pharmacy Antibiotic Note Caroline Griffith is a 83 y.o. female admitted on 11/03/2016 with UTI. Pharmacy has been consulted for ciprofloxacin dosing.  Plan: 1. Ciprofloxacin 400 mg IV every 12 hours 2. Plan to change to PO when reliably tolerating oral 3. Remains on fluconazole 400 mg IV every 24 hours for recent hx of C.albicans fungemia with planned stop date 10/30  Height: 5' 6.5" (168.9 cm) Weight: 159 lb 13.3 oz (72.5 kg) IBW/kg (Calculated) : 60.45  Temp (24hrs), Avg:98.7 F (37.1 C), Min:98.5 F (36.9 C), Max:98.9 F (37.2 C)   Recent Labs Lab 11/03/16 1624 11/03/16 1718 11/03/16 1719 11/03/16 2328 11/04/16 0240 11/04/16 0512  WBC 10.3  --   --  7.5  --  6.6  CREATININE 1.15* 1.00  --  1.03*  --  0.92  LATICACIDVEN  --   --  1.36 0.9 0.7  --     Estimated Creatinine Clearance: 43.5 mL/min (by C-G formula based on SCr of 0.92 mg/dL).    No Known Allergies  Antimicrobials this admission: Cefepime 10/25 >>10/26  Ciprofloxacin 10/26 >>  . Fluconazole PTA (fungemia) >> (intended 10/30) Vanc 10/24 x 1 Zosyn 10/24 x 1   Microbiology results: 10/24 UCx: P.aeruginosa; intermediate to Cefepime and Ceftazidime and S to FQ's  10/24 BCx: ngtd 10/25 MRSA PCR >> neg  Thank you for allowing pharmacy to be a part of this patient's care.  Vincenza Hews, PharmD, BCPS 11/05/2016, 8:23 AM

## 2016-11-05 NOTE — NC FL2 (Signed)
[place] LEVEL OF CARE SCREENING TOOL     IDENTIFICATION  Patient Name: Caroline Griffith Birthdate: 1931/01/04 Sex: female Admission Date (Current Location): 11/03/2016  [facility] and [place] Number:  Herbalist and Address:  [facility], [place] [address], [place], [place] [address]      Provider Number: [id number]  Attending Physician Name and Address:  Rosita Fire, MD  Relative Name and Phone Number:  Keane Scrape - daughter; [phone]    Current Level of Care: Hospital Recommended Level of Care: [place] Prior Approval Number:    Date Approved/Denied:   PASRR Number: [id number] (Eff. 10/08/16)  Discharge Plan: SNF    Current Diagnoses: Patient Active Problem List   Diagnosis Date Noted  . Chronic kidney disease   . Acute lower UTI 11/03/2016  . Acute encephalopathy 11/03/2016  . Sepsis ([place]) 11/03/2016  . Physical deconditioning 11/03/2016  . AKI (acute kidney injury) ([place]) 11/03/2016  . Dehydration 11/03/2016  . Hypoalbuminemia 11/03/2016  . HCAP (healthcare-associated pneumonia) 11/03/2016  . Prolonged QT interval 11/03/2016  . Vitamin D deficiency 10/22/2016  . Sepsis due to Candida ([place]) 10/22/2016  . Fungemia: Candida 10/22/2016  . Leukocytosis 10/20/2016  . Hypotension 10/20/2016  . Anemia 10/20/2016  . Hypocalcemia 10/20/2016  . Right ureteral calculus 10/19/2016  . H/O cystoscopy 10/19/2016  . Nephrolithiasis 10/01/2016  . Renal insufficiency 10/01/2016  . Lower urinary tract infectious disease   . Stone, kidney 08/15/2014  . Right nephrolithiasis 08/15/2014  . Pulmonary nodules 03/20/2014  . SOB (shortness of breath) 03/19/2014    Orientation RESPIRATION BLADDER Height & Weight     Self, Place, Situation  Normal Incontinent, External catheter (catheter placed 10/20/16) Weight: 159 lb 13.3 oz (72.5 kg) Height:  5' 6.5" (168.9 cm)  BEHAVIORAL SYMPTOMS/MOOD NEUROLOGICAL  BOWEL NUTRITION STATUS      Continent Diet (DYS 3)  AMBULATORY STATUS COMMUNICATION OF NEEDS Skin   Total Care (Not attempted on 10/25) Verbally Other (Comment) (Stage 1 pressure injury to sacrum)                       Personal Care Assistance Level of Assistance  Bathing, Feeding Bathing Assistance: Maximum assistance (Upper body min assist and lower body max assist) Feeding assistance: Limited assistance Dressing Assistance: Maximum assistance     Functional Limitations Info  Sight, Hearing, Speech Sight Info: Impaired Hearing Info: Adequate Speech Info: Adequate    SPECIAL CARE FACTORS FREQUENCY  PT (By licensed PT), OT (By licensed OT), Speech therapy     PT Frequency: Evaluated 10/25 OT Frequency: Evaluated 10/25     Speech Therapy Frequency: Evaluated 10/25 - swallow eval      Contractures Contractures Info: Not present    Additional Factors Info  Code Status, Allergies Code Status Info: DNR Allergies Info: No known allergies           Current Medications (11/05/2016):  This is the current hospital active medication list Current Facility-Administered Medications  Medication Dose Route Frequency Provider Last Rate Last Dose  . 0.9 %  sodium chloride infusion   Intravenous Continuous Rosita Fire, MD 75 mL/hr at 11/05/16 [phone]    . acetaminophen (TYLENOL) tablet 650 mg  650 mg Oral Q6H PRN Doutova, Anastassia, MD       Or  . acetaminophen (TYLENOL) suppository 650 mg  650 mg Rectal Q6H PRN Toy Baker, MD      . bisacodyl (DULCOLAX)  EC tablet 5 mg  5 mg Oral Daily PRN Toy Baker, MD      . ciprofloxacin (CIPRO) IVPB 400 mg  400 mg Intravenous Q12H Rosita Fire, MD   Stopped at 11/05/16 1234  . dronabinol (MARINOL) capsule 2.5 mg  2.5 mg Oral BID AC Rosita Fire, MD   2.5 mg at 11/05/16 1135  . enoxaparin (LOVENOX) injection 40 mg  40 mg Subcutaneous Q24H Rolla Flatten, [place]   40 mg at 11/05/16 1136  . feeding  supplement (ENSURE ENLIVE) (ENSURE ENLIVE) liquid 237 mL  237 mL Oral BID BM Rosita Fire, MD   237 mL at 11/05/16 1134  . fluconazole (DIFLUCAN) IVPB 400 mg  400 mg Intravenous Q24H Toy Baker, MD   Stopped at 11/05/16 0505  . HYDROcodone-acetaminophen (NORCO/VICODIN) 5-325 MG per tablet 1-2 tablet  1-2 tablet Oral Q4H PRN Doutova, Anastassia, MD      . latanoprost (XALATAN) 0.005 % ophthalmic solution 1 drop  1 drop Both Eyes QHS Doutova, Anastassia, MD   1 drop at 11/05/16 0118  . RESOURCE THICKENUP CLEAR   Oral PRN Rosita Fire, MD      . senna-docusate (Senokot-S) tablet 1 tablet  1 tablet Oral QHS PRN Toy Baker, MD      . terazosin (HYTRIN) capsule 1 mg  1 mg Oral QHS Doutova, Anastassia, MD   1 mg at 11/05/16 0118  . thiamine (B-1) injection 100 mg  100 mg Intravenous Daily Doutova, Anastassia, MD   100 mg at 11/05/16 1135  . traMADol (ULTRAM) tablet 50-100 mg  50-100 mg Oral Q6H PRN Toy Baker, MD         Discharge Medications: Please see discharge summary for a list of discharge medications.  Relevant Imaging Results:  Relevant Lab Results:   Additional Information [phone].  Sable Feil, LCSW

## 2016-11-05 NOTE — Progress Notes (Signed)
  Speech Language Pathology Treatment: Dysphagia  Patient Details Name: Caroline Griffith MRN: [id number] DOB: 15-Sep-1930 Today's Date: 11/05/2016 Time: 7017-7939 SLP Time Calculation (min) (ACUTE ONLY): 28 min  Assessment / Plan / Recommendation Clinical Impression  Pt required much encouragement to eat some of breakfast; daughter present. Immediate and delayed cough, multiple swallows observed as expected from results of MBS. Explained that she is at high aspiration risk even with diet modifications and strategies which are designed to mitigate symptoms. Explained her cough is positive that her body is sensing penetrates. She will need continued supervision to ensure strategies and encouragement for po's is given. Recommend CIR ST.   HPI HPI: Caroline Griffith is a 83 y.o. female with medical history significant of kidney stones, macular degeneration, varicose veins, OA admitted for confusion, weakness, and FTT. Previous BSE on 10/15 reports poor appetite with no signs of oropharyngeal or esophageal concerns. Esophogram on 07/08/2005 revealed large hiatal herneria without stricture or reflux and mild decrease in esophageal peristalsis. CXR 10/24 clear.       SLP Plan  Continue with current plan of care       Recommendations  Diet recommendations: Dysphagia 3 (mechanical soft);Nectar-thick liquid Liquids provided via: Cup;No straw Medication Administration: Crushed with puree Supervision: Patient able to self feed;Full supervision/cueing for compensatory strategies Compensations: Slow rate;Small sips/bites;Multiple dry swallows after each bite/sip;Other (Comment);Hard cough after swallow Postural Changes and/or Swallow Maneuvers: Seated upright 90 degrees                Oral Care Recommendations: Oral care BID Follow up Recommendations: Inpatient Rehab SLP Visit Diagnosis: Dysphagia, oropharyngeal phase (R13.12) Plan: Continue with current plan of care       GO                 Houston Siren 11/05/2016, 9:18 AM  Orbie Pyo Colvin [name].[employer] [phone]

## 2016-11-05 NOTE — Clinical Social Work Note (Signed)
CSW received SNF consult 10/25 and CIR consult also made same date. CIR currently checking patient's insurance for authorization. CSW will follow-up with family regarding SNF if CIR not approved.  Riggs Dineen Givens, MSW, LCSW Licensed Clinical Social Worker [place] [phone]

## 2016-11-05 NOTE — Care Management Note (Signed)
Case Management Note  Patient Details  Name: TYAN LASURE MRN: [id number] Date of Birth: 12/14/1930  Subjective/Objective:      CM following for progression and d/c planning.               Action/Plan: 11/05/2016 Noted CIR recommending pt return to SNF. No insurance approval and no beds available to CIR. Pt is currently a SNF resident. Family requesting change in SNF facility. CSW, Crawford Givens working with family on possible change in SNF.  Pt will have 24hr care when she is able to return to home from SNF.   Expected Discharge Date:                  Expected Discharge Plan:  [place]  In-House Referral:  Clinical Social Work  Discharge planning Services  CM Consult  Post Acute Care Choice:  NA Choice offered to:  Adult Children  DME Arranged:    DME Agency:     HH Arranged:    HH Agency:     Status of Service:  In process, will continue to follow  If discussed at Long Length of Stay Meetings, dates discussed:    Additional Comments:  Adron Bene, RN 11/05/2016, 4:04 PM

## 2016-11-05 NOTE — Progress Notes (Signed)
PROGRESS NOTE    Caroline Griffith  JAS:[id number] DOB: [birth year] DOA: 11/03/2016 PCP: Lavone Orn, MD  Outpatient Specialists: Podiatry ([facility] [place], [place])    Brief Narrative:  Patient is a 83 y.o. female w/ PMHx significant for CKD, renal calculi, hiatal hernia, falls, and h/o right ureteral stents who presented from SNF with AMS, weakness, and fatigue on 10/24.  Patient was discharged from [facility] on 10/17 for fungemia (candida), discharged on fluconazole.  In ED (10/24), patient met sepsis criteria, hemoglobin was 10.5 and UA was consistent with UTI.  CXR noted large hiatal hernia. Head CT showed chronic microvascular disease.  Renal stone study CT showed tiny, non-obstrucing right renal calculi and stable, moderate left renal atrophy.  Blood and urine cultures (10/24) pending.  Started cefepime 10/24 per pharmacy consult.  Swallow study 10/25 - moderate to severe aspiration risk, recommended mechanically soft diet.  Urine culture (10/24) positive for pseudomonas aerunginosa, sensitive to Cipro; abx adjusted.  Patient's daughter expressed concern about patient returning to SNF, would like to consider inpatient rehab with eventual discharge to her home in [place], [place].   Assessment & Plan:   Active Problems:   Anemia   Fungemia: Candida   Acute lower UTI   Acute encephalopathy   Sepsis (HCC)   Physical deconditioning   AKI (acute kidney injury) ([place])   Dehydration   Hypoalbuminemia   HCAP (healthcare-associated pneumonia)   Prolonged QT interval   Chronic kidney disease   1. Urinary tract infection, recurrent -UA (10/24) showed small Hgb, positive protein and nitrites, large leukocytes, many bacteria -Urine culture 10/24 positive for pseudomonas aerunginosa, sensitive to Cipro -IV cefepime discontinued and IV Cipro started 10/26  2. Candida albicans fungemia -WBC 6.6 yesterday, patient remains afebrile -Blood culture pending, no growth to  date -Continue IV fluconazole (planned stop date 10/30)  3. Anemia in setting of chronic kidney disease -H/o low iron, TIBC (10/10).  Continue supplemental PO iron -Hemoglobin 8.7 yesterday -Continue to monitor  4. Physical deconditioning -Swallow study (10/25) - mod to severe aspiration risk, recommended mechanically soft diet -Rehab consulted 10/25 - recommended CIR   -SpO2 99% on RA today -Continue to work with PT/OT  5. Malnutrition secondary to poor intake -H/o decreased oral intake x 1 month -Low protein and albumin on 10/25 -Continue to monitor and provide supplemental nutrition   DVT prophylaxis: Lovenox Code Status: DNR Family Communication: Daughter at bedside Disposition Plan: CIR as per inpatient rehab consult recommendations.  Eventual transition to daughter's home in [place], [place].  Family does not want patient to return to [facility].   Consultants:   Pharmacy  Nutrition  Social work  PT/OT  Procedures:   None  Antimicrobials:   IV vancomycin (10/24)  IV zosyn (10/24)  IV fluconazole (10/24 - )  IV cefepime (10/24 - 10/26)  IV ciprofloxacin (10/26 - )   Subjective: Patient sitting in chair of hospital room, brushing teeth.  Patient's daughter at bedside.  Per daughter, patient continues to improve.  Continues to have some difficulty eating/swallowing.  Patient denies fever/chills, nausea/vomiting, abdominal pain.  Objective: Vitals:   11/04/16 1700 11/04/16 2047 11/05/16 0529 11/05/16 1000  BP: 134/72 133/73 124/62 117/73  Pulse: 88 92 87 97  Resp: _0 Temp: 98.6 F (37 C) 98.9 F (37.2 C) 98.5 F (36.9 C) 98.1 F (36.7 C)  TempSrc: Oral Oral Oral Axillary  SpO2: 98% 98% 99% 99%  Weight:  72.5 kg (  159 lb 13.3 oz)    Height:        Intake/Output Summary (Last 24 hours) at 11/05/16 1241 Last data filed at 11/05/16 1022  Gross per 24 hour  Intake           1167.5 ml  Output             1325 ml  Net            -157.5 ml   Filed Weights   11/03/16 1603 11/04/16 2047  Weight: 72.6 kg (160 lb) 72.5 kg (159 lb 13.3 oz)    Examination:  General exam: Appears calm and comfortable in hospital chair.  No acute distress Respiratory system: Clear to auscultation bilaterally. Respiratory effort normal. Cardiovascular system: S1 & S2 heard, RRR. No JVD, murmurs, rubs, gallops or clicks. No pedal edema. Gastrointestinal system: Abdomen is soft, nondistended, and nontender. No organomegaly or masses felt. Normal bowel sounds heard. Central nervous system: Alert and oriented. No focal neurological deficits. Extremities: Symmetric 5 x 5 power. Skin: No rashes, lesions or ulcers Psychiatry: Judgement and insight appear normal. Mood & affect appropriate.     Data Reviewed: I have personally reviewed following labs and imaging studies  CBC:  Recent Labs Lab 11/03/16 1624 11/03/16 1718 11/03/16 2328 11/04/16 0512  WBC 10.3  --  7.5 6.6  NEUTROABS 7.7  --  4.8  --   HGB 10.1* 10.5* 8.8* 8.7*  HCT 32.3* 31.0* 27.9* 28.3*  MCV 93.1  --  93.0 93.4  PLT 493*  --  453* 920*   Basic Metabolic Panel:  Recent Labs Lab 11/03/16 1624 11/03/16 1718 11/03/16 2328 11/04/16 0240 11/04/16 0512  NA 138 139 140  --  141  K 3.8 3.9 3.4*  --  3.8  CL 99* 99* 106  --  106  CO2 30  --  26  --  25  GLUCOSE 108* 106* 92  --  91  BUN _0 --  11  CREATININE 1.15* 1.00 1.03*  --  0.92  CALCIUM 8.7*  --  8.0*  --  7.9*  MG  --   --   --   --  1.7  PHOS  --   --   --  3.2  --    GFR: Estimated Creatinine Clearance: 43.5 mL/min (by C-G formula based on SCr of 0.92 mg/dL). Liver Function Tests:  Recent Labs Lab 11/03/16 1624 11/03/16 2328 11/04/16 0512  AST 35 29 25  ALT _1 ALKPHOS 181* 160* 148*  BILITOT 1.3* 1.2 1.1  PROT 6.4* 5.8* 5.4*  ALBUMIN 2.3* 1.9* 1.8*   No results for input(s): LIPASE, AMYLASE in the last 168 hours.  Recent Labs Lab 11/03/16 1624  AMMONIA 22    Coagulation Profile: No results for input(s): INR, PROTIME in the last 168 hours. Cardiac Enzymes: No results for input(s): CKTOTAL, CKMB, CKMBINDEX, TROPONINI in the last 168 hours. BNP (last 3 results) No results for input(s): PROBNP in the last 8760 hours. HbA1C: No results for input(s): HGBA1C in the last 72 hours. CBG:  Recent Labs Lab 11/03/16 1712  GLUCAP 103*   Lipid Profile: No results for input(s): CHOL, HDL, LDLCALC, TRIG, CHOLHDL, LDLDIRECT in the last 72 hours. Thyroid Function Tests:  Recent Labs  11/04/16 0240  TSH 1.200   Anemia Panel: No results for input(s): VITAMINB12, FOLATE, FERRITIN, TIBC, IRON, RETICCTPCT in the last 72 hours. Urine analysis:    Component Value Date/Time  COLORURINE AMBER (A) 11/03/2016 1645   APPEARANCEUR HAZY (A) 11/03/2016 1645   LABSPEC 1.016 11/03/2016 1645   PHURINE 5.0 11/03/2016 1645   GLUCOSEU NEGATIVE 11/03/2016 1645   HGBUR SMALL (A) 11/03/2016 [place] NEGATIVE 11/03/2016 [place] 11/03/2016 1645   PROTEINUR 30 (A) 11/03/2016 1645   NITRITE POSITIVE (A) 11/03/2016 1645   LEUKOCYTESUR LARGE (A) 11/03/2016 1645   Sepsis Labs: _0 (procalcitonin:4,lacticidven:4)  ) Recent Results (from the past 240 hour(s))  Blood culture (routine x 2)     Status: None (Preliminary result)   Collection Time: 11/03/16  4:28 PM  Result Value Ref Range Status   Specimen Description BLOOD LEFT ANTECUBITAL  Final   Special Requests   Final    BOTTLES DRAWN AEROBIC AND ANAEROBIC Blood Culture adequate volume   Culture NO GROWTH 2 DAYS  Final   Report Status PENDING  Incomplete  Urine culture     Status: Abnormal (Preliminary result)   Collection Time: 11/03/16  4:40 PM  Result Value Ref Range Status   Specimen Description URINE, RANDOM  Final   Special Requests NONE  Final   Culture >=100,000 COLONIES/mL PSEUDOMONAS AERUGINOSA (A)  Final   Report Status PENDING  Incomplete   Organism ID,  Bacteria PSEUDOMONAS AERUGINOSA (A)  Final      Susceptibility   Pseudomonas aeruginosa - MIC*    CEFTAZIDIME 16 INTERMEDIATE Intermediate     CIPROFLOXACIN 0.5 SENSITIVE Sensitive     GENTAMICIN 2 SENSITIVE Sensitive     IMIPENEM 1 SENSITIVE Sensitive     PIP/TAZO 32 SENSITIVE Sensitive     CEFEPIME 16 INTERMEDIATE Intermediate     * >=100,000 COLONIES/mL PSEUDOMONAS AERUGINOSA  Blood culture (routine x 2)     Status: None (Preliminary result)   Collection Time: 11/03/16  5:03 PM  Result Value Ref Range Status   Specimen Description BLOOD RIGHT FOREARM  Final   Special Requests   Final    BOTTLES DRAWN AEROBIC AND ANAEROBIC Blood Culture adequate volume   Culture NO GROWTH 2 DAYS  Final   Report Status PENDING  Incomplete  MRSA PCR Screening     Status: None   Collection Time: 11/04/16  2:07 AM  Result Value Ref Range Status   MRSA by PCR NEGATIVE NEGATIVE Final    Comment:        The GeneXpert MRSA Assay (FDA approved for NASAL specimens only), is one component of a comprehensive MRSA colonization surveillance program. It is not intended to diagnose MRSA infection nor to guide or monitor treatment for MRSA infections.          Radiology Studies: Dg Chest 2 View  Result Date: 11/03/2016 CLINICAL DATA:  Altered mental status. EXAM: CHEST  2 VIEW COMPARISON:  10/22/2016 and prior radiographs.  10/01/2016 CT FINDINGS: Cardiomediastinal silhouette is unchanged with moderate to large hiatal hernia again noted. This is a low volume film. There is no evidence of focal airspace disease, pulmonary edema, suspicious pulmonary nodule/mass, pleural effusion, or pneumothorax. No acute bony abnormalities are identified. IMPRESSION: No evidence of acute cardiopulmonary disease. Hiatal hernia. Electronically Signed   By: Margarette Canada M.D.   On: 11/03/2016 17:55   Dg Wrist 2 Views Right  Result Date: 11/03/2016 CLINICAL DATA:  Pain and swelling, no known injury, initial encounter EXAM:  RIGHT WRIST - 2 VIEW COMPARISON:  10/27/2016 FINDINGS: Degenerative changes of the first [facility] joint are noted. Calcification of the TFCC is noted. No acute fracture or  dislocation is seen. Generalized soft tissue swelling is noted. IMPRESSION: Degenerative changes with associated soft tissue swelling. The overall appearance is stable from the prior exam. Electronically Signed   By: Inez Catalina M.D.   On: 11/03/2016 21:48   Ct Head Wo Contrast  Result Date: 11/03/2016 CLINICAL DATA:  Altered level consciousness. EXAM: CT HEAD WITHOUT CONTRAST TECHNIQUE: Contiguous axial images were obtained from the base of the skull through the vertex without intravenous contrast. COMPARISON:  02/08/2011 FINDINGS: Brain: There is atrophy and chronic small vessel disease changes. No acute intracranial abnormality. Specifically, no hemorrhage, hydrocephalus, mass lesion, acute infarction, or significant intracranial injury. Vascular: No hyperdense vessel or unexpected calcification. Skull: No acute calvarial abnormality. Sinuses/Orbits: Visualized paranasal sinuses and mastoids clear. Orbital soft tissues unremarkable. Other: None IMPRESSION: No acute intracranial abnormality. Atrophy, chronic microvascular disease. Electronically Signed   By: Rolm Baptise M.D.   On: 11/03/2016 17:43   Dg Swallowing Func-speech Pathology  Result Date: 11/04/2016 Objective Swallowing Evaluation: Type of Study: MBS-Modified Barium Swallow Study Patient Details Name: ALIRA FRETWELL MRN: [id number] Date of Birth: 1930/09/01 Today's Date: 11/04/2016 Time: SLP Start Time (ACUTE ONLY): 1044-SLP Stop Time (ACUTE ONLY): 1111 SLP Time Calculation (min) (ACUTE ONLY): 27 min Past Medical History: Past Medical History: Diagnosis Date . Anxiety  . Arthritis  . Chronic kidney disease   kidney stones . History of hiatal hernia  . History of kidney stones  . Macular degeneration  . Pneumonia   hx. of . Urolithiasis  . Varicose veins   bilateral LE   Past  Surgical History: Past Surgical History: Procedure Laterality Date . ABDOMINAL HYSTERECTOMY   . BUNIONECTOMY Right  . CYSTOSCOPY W/ URETERAL STENT REMOVAL Right 10/26/2016  Procedure: CYSTOSCOPY WITH STENT REMOVAL;  Surgeon: Irine Seal, MD;  Location: [facility];  Service: Urology;  Laterality: Right; . CYSTOSCOPY WITH STENT PLACEMENT Right 08/15/2014  Procedure: CYSTOSCOPY WITH RIGHT STENT PLACEMENT;  Surgeon: [name], MD;  Location: [facility];  Service: Urology;  Laterality: Right; . CYSTOSCOPY WITH STENT PLACEMENT Right 10/02/2016  Procedure: CYSTOSCOPY, RIGHT RETROGRADE WITH RIGHT URETERAL STENT PLACEMENT;  Surgeon: Irine Seal, MD;  Location: [facility];  Service: Urology;  Laterality: Right; . CYSTOSCOPY/URETEROSCOPY/HOLMIUM LASER/STENT PLACEMENT Right 10/19/2016  Procedure: CYSTOSCOPY/URETEROSCOPY/HOLMIUM LASER/STENT EXCHANGE;  Surgeon: Irine Seal, MD;  Location: [facility];  Service: Urology;  Laterality: Right;  ONLY NEEDS 60 MINUTES FOR PROCEDURE . EXTRACORPOREAL SHOCK WAVE LITHOTRIPSY Right 08/18/2014 . JOINT REPLACEMENT Right   Total hip replacement . JOINT REPLACEMENT Right   Total knee replacement . ORIF WRIST FRACTURE Left  . TONSILLECTOMY   HPI: WHITLEIGH GARRAMONE is a 83 y.o. female with medical history significant of kidney stones, macular degeneration, varicose veins, OA admitted for confusion, weakness, and FTT. Previous BSE on 10/15 reports poor appetite with no signs of oropharyngeal or esophageal concerns. Esophogram on 07/08/2005 revealed large hiatal herneria without stricture or reflux and mild decrease in esophageal peristalsis. CXR 10/24 clear.  Subjective: pt awake in bed, son Timmothy Sours present Assessment / Plan / Recommendation CHL IP CLINICAL IMPRESSIONS 11/04/2016 Clinical Impression Pt demonstrates mild oral dysphagia marked by piecemeal bolus pattern, decreased tongue based retraction leading to intermittent and mild residue. Moderate-severe motor based pharyngeal dysphagia leading to incomplete  epiglottic inversion resulting in penetration during the swallow of thin to the cords with delayed reflexive cough. Additional aspiration present with chin tuck strategy. Inconsistent flash laryngeal penetration noted with nectar-thick barium. Residue in the valleculae due to poor epiglottic deflection and lingual  residue. Pt required approximately 5-6 swallows in order to clear pharynx of small single sips likely due to suspected insufficient UES opening for passage of entire bolus into esophagus. Moderate-severe pyriform residue consistent throughout study, likely caused by decreased pharyngeal constriction and inability to clear despite attempts of liquid wash and verbal cues for effortful swallows. Cricopharyngeal bar noted. Esophageal sweep performed; did not note overt abnormalities (MBS does not diagnose below the level of the UES). With history of esophageal dysfunction, further esophageal workup may be warranted.  Pt may benefit from RMST for potential improvement in laryngeal strength for swallowing; CIR suggested. Recommend Dys 3 (mech soft) diet with nectar-thick liquids, multiple swallows, alternate liquids and solids, meds crushed in puree and NO STRAWS. Will continue ST intervention for dysphagia management.  SLP Visit Diagnosis Dysphagia, oropharyngeal phase (R13.12) Attention and concentration deficit following -- Frontal lobe and executive function deficit following -- Impact on safety and function Moderate aspiration risk;Severe aspiration risk   CHL IP TREATMENT RECOMMENDATION 11/04/2016 Treatment Recommendations Therapy as outlined in treatment plan below   Prognosis 11/04/2016 Prognosis for Safe Diet Advancement Fair Barriers to Reach Goals Severity of deficits Barriers/Prognosis Comment -- CHL IP DIET RECOMMENDATION 11/04/2016 SLP Diet Recommendations Dysphagia 3 (Mech soft) solids;Nectar thick liquid Liquid Administration via Cup Medication Administration Crushed with puree Compensations Slow  rate;Small sips/bites;Multiple dry swallows after each bite/sip;Other (Comment) Postural Changes Remain semi-upright after after feeds/meals (Comment);Seated upright at 90 degrees   CHL IP OTHER RECOMMENDATIONS 11/04/2016 Recommended Consults Consider esophageal assessment Oral Care Recommendations Oral care BID Other Recommendations Order thickener from pharmacy   CHL IP FOLLOW UP RECOMMENDATIONS 11/04/2016 Follow up Recommendations Inpatient Rehab   CHL IP FREQUENCY AND DURATION 11/04/2016 Speech Therapy Frequency (ACUTE ONLY) min 2x/week Treatment Duration 2 weeks      CHL IP ORAL PHASE 11/04/2016 Oral Phase Impaired Oral - Pudding Teaspoon -- Oral - Pudding Cup -- Oral - Honey Teaspoon -- Oral - Honey Cup -- Oral - Nectar Teaspoon -- Oral - Nectar Cup Piecemeal swallowing;Lingual/palatal residue Oral - Nectar Straw -- Oral - Thin Teaspoon -- Oral - Thin Cup Piecemeal swallowing;Lingual/palatal residue Oral - Thin Straw -- Oral - Puree -- Oral - Mech Soft -- Oral - Regular Piecemeal swallowing;Lingual/palatal residue Oral - Multi-Consistency -- Oral - Pill -- Oral Phase - Comment --  CHL IP PHARYNGEAL PHASE 11/04/2016 Pharyngeal Phase Impaired Pharyngeal- Pudding Teaspoon -- Pharyngeal -- Pharyngeal- Pudding Cup -- Pharyngeal -- Pharyngeal- Honey Teaspoon -- Pharyngeal -- Pharyngeal- Honey Cup -- Pharyngeal -- Pharyngeal- Nectar Teaspoon -- Pharyngeal -- Pharyngeal- Nectar Cup Penetration/Aspiration during swallow;Reduced pharyngeal peristalsis;Reduced epiglottic inversion;Reduced tongue base retraction;Pharyngeal residue - valleculae;Pharyngeal residue - pyriform;Pharyngeal residue - cp segment Pharyngeal Material enters airway, CONTACTS cords and then ejected out Pharyngeal- Nectar Straw -- Pharyngeal -- Pharyngeal- Thin Teaspoon -- Pharyngeal -- Pharyngeal- Thin Cup Reduced airway/laryngeal closure;Reduced pharyngeal peristalsis;Reduced epiglottic inversion;Reduced tongue base retraction;Penetration/Aspiration  during swallow;Pharyngeal residue - valleculae;Pharyngeal residue - pyriform;Pharyngeal residue - cp segment;Compensatory strategies attempted (with notebox) Pharyngeal Material enters airway, passes BELOW cords and not ejected out despite cough attempt by patient Pharyngeal- Thin Straw -- Pharyngeal -- Pharyngeal- Puree -- Pharyngeal -- Pharyngeal- Mechanical Soft -- Pharyngeal -- Pharyngeal- Regular Reduced pharyngeal peristalsis;Reduced epiglottic inversion;Reduced tongue base retraction;Pharyngeal residue - valleculae;Pharyngeal residue - pyriform;Pharyngeal residue - cp segment Pharyngeal -- Pharyngeal- Multi-consistency -- Pharyngeal -- Pharyngeal- Pill -- Pharyngeal -- Pharyngeal Comment --  No flowsheet data found. No flowsheet data found. Houston Siren 11/04/2016, 3:23 PM Orbie Pyo Colvin [name].Ed  CCC-SLP Pager [phone]              Ct Renal Stone Study  Result Date: 11/03/2016 CLINICAL DATA:  Abdominal pain.  Recent sepsis. EXAM: CT ABDOMEN AND PELVIS WITHOUT CONTRAST TECHNIQUE: Multidetector CT imaging of the abdomen and pelvis was performed following the standard protocol without IV contrast. COMPARISON:  10/01/2016. FINDINGS: Lower chest: Interval mild right lower lobe atelectasis and minimal left lower lobe atelectasis. There is also interval 1.4 cm oval density in the left lower lobe, adjacent to a large hiatal hernia containing stomach and fat. Hepatobiliary: The liver remains somewhat small. 7 mm gallstone in the gallbladder without gallbladder wall thickening or pericholecystic fluid. Pancreas: Moderate diffuse pancreatic atrophy. Spleen: Normal in size without focal abnormality. Adrenals/Urinary Tract: Tiny mid and lower right renal calculi. The previously demonstrated 8 mm right UPJ calculus is no longer demonstrated. The previously demonstrated right hydronephrosis has resolved. There are mildly prominent bilateral extrarenal pelves, right greater than left. Moderate atrophy of the  left kidney is again noted. No bladder or ureteral calculi are seen. Normal appearing adrenal glands. Stomach/Bowel: Large hiatal hernia containing stomach and fat. Large number of colonic diverticula without evidence of diverticulitis. Normal appearing appendix. Unremarkable small bowel. Vascular/Lymphatic: Atheromatous arterial calcifications without aneurysm. No enlarged lymph nodes. Reproductive: Status post hysterectomy. No adnexal masses. Other: No abdominal wall hernia or abnormality. No abdominopelvic ascites. Musculoskeletal: Right hip prosthesis with associated streak artifacts. Lumbar spine degenerative changes and scoliosis. These include facet degenerative changes with associated grade 1 anterolisthesis at the L4-5 and L5-S1 levels. No pars defects or fractures are seen. IMPRESSION: 1. Interval 1.4 cm oval density in the left lower lobe, adjacent to a large hiatal hernia. This has an appearance compatible with a rounded area of atelectasis or pneumonia. 2. Mild right lower lobe and minimal left lower lobe linear atelectasis. 3. Cholelithiasis without evidence of cholecystitis. 4. The previously demonstrated right UPJ calculus and hydronephrosis are no longer present. 5. Tiny, nonobstructing right renal calculi. 6. Large hiatal hernia containing stomach and fat. 7. Extensive colonic diverticulosis. 8. Stable moderate left renal atrophy. Electronically Signed   By: Claudie Revering M.D.   On: 11/03/2016 21:26        Scheduled Meds: . dronabinol  2.5 mg Oral BID AC  . enoxaparin (LOVENOX) injection  40 mg Subcutaneous Q24H  . feeding supplement (ENSURE ENLIVE)  237 mL Oral BID BM  . latanoprost  1 drop Both Eyes QHS  . terazosin  1 mg Oral QHS  . thiamine  100 mg Intravenous Daily   Continuous Infusions: . sodium chloride 75 mL/hr at 11/05/16 0608  . ciprofloxacin 400 mg (11/05/16 1134)  . fluconazole (DIFLUCAN) IV Stopped (11/05/16 0505)     LOS: 2 days      [place]ania Hall-Potvin,  PA-S [facility] Pager [phone]  If 7PM-7AM, please contact night-coverage [email or web address] Password [email or web address] 11/05/2016, 12:41 PM

## 2016-11-05 NOTE — Progress Notes (Signed)
I met with pt and her daughter at bedside to discuss goals and expectations of an inpt rehab admission. They prefer CIR rather than return to SNF. I explained that I do not have a bed available over the weekend into next week as well as I would have to get insurance approval with [employer]. I discussed that they could look into other SNF rehab if not admitted into CIR. She does have 24/7 assist available at home with [facility]. I updated RN CM. I will begin insurance authorization for a possible inpt rehab admit if pt remains in house. I will follow up on Monday. [phone]

## 2016-11-06 NOTE — Progress Notes (Signed)
PROGRESS NOTE    Caroline Griffith  SWF:[id number] DOB: 1930-08-29 DOA: 11/03/2016 PCP: Lavone Orn, MD   Brief Narrative: 83 year old female with recent urological procedure related with renal calculi, UTI, fungal me on fluconazole daily October 30 presented from nursing home for confusion, generalized fatigue and lethargy. Patient was found to have UTI, fever, mild leukocytosis in the ER. Started on antibiotics and admitted for further evaluation.  Now waiting for CIR  Assessment & Plan:  # Recurrent urinary tract infection: Patient with a recent urological procedure. The urine culture growing pseudomonas. Continue ciprofloxacin. I do not think patient was septic.  #Candidemia from recent admission: Continue fluconazole. The last dose on October 30. Patient seems clinically improving.  #Acute metabolic encephalopathy likely in the setting of UTI: Patient's mental status improved and around baseline. No focal neurological deficit. Continue to monitor.  #Physical deconditioning: Patient and her daughter like to pursue rehabilitation in the hospital and wanted to home on discharge with her daughter. Inpatient rehabilitation consult requested. Now waiting for CIR bed acceptance. Pt is medically stable to go to rehab.  #CT scan reported as left lower lobe density. Patient has no pulmonary symptoms and do not think this is pneumonia, likely atelectasis. She is not hypoxic. Continue supportive care.  #Likely moderate protein calorie malnutrition: Dietary referral. Encourage oral intake. Patient has decreased oral intake for about a month now.  # Normocytic anemia multifactorial etiology including iron deficiency anemia and anemia of chronic kidney disease: no iron because of infection. Hemoglobin stable. No sign of bleeding. Continue to monitor.   DVT prophylaxis:lovenox sq Code Status:DNR Family Communication:discussed with the patient's daughter yesterday. Disposition Plan:awaiting  for to go to inpatient rehabilitation.    Consultants:  rehab  Procedures:none Antimicrobials:ciprofloxacin since October 26. Patient was on cefepime prior to that. On fluconazole. Subjective: Seen and examined at bedside. No nausea vomiting chest pain or shortness of breath.  Objective: Vitals:   11/05/16 1539 11/05/16 2220 11/06/16 0617 11/06/16 1004  BP: 127/61 128/63 137/64 (!) 134/51  Pulse: 97 99 89 87  Resp: 16 16 16 18   Temp: 98.1 F (36.7 C) 98.8 F (37.1 C) 98.4 F (36.9 C) 98.1 F (36.7 C)  TempSrc: Oral Oral Oral Oral  SpO2: 98% 96% 97% 100%  Weight:  72.6 kg (160 lb)    Height:        Intake/Output Summary (Last 24 hours) at 11/06/16 1049 Last data filed at 11/06/16 1012  Gross per 24 hour  Intake             2825 ml  Output             1000 ml  Net             1825 ml   Filed Weights   11/03/16 1603 11/04/16 2047 11/05/16 2220  Weight: 72.6 kg (160 lb) 72.5 kg (159 lb 13.3 oz) 72.6 kg (160 lb)    Examination:  General exam:lying on bed comfortable, not in distress  Respiratory system: clear bilateral, respiratory effort normal Cardiovascular system: regular rate rhythm, S1-S2 normal. No pedal edema. Gastrointestinal system: Abdomen is nondistended, soft and nontender. Normal bowel sounds heard. Central nervous system: Alert and oriented. No focal neurological deficits. Skin: No rashes, lesions or ulcers Psychiatry: Judgement and insight appear normal/age appropriate     Data Reviewed: I have personally reviewed following labs and imaging studies  CBC:  Recent Labs Lab 11/03/16 1624 11/03/16 1718 11/03/16 2328 11/04/16 0512  WBC 10.3  --  7.5 6.6  NEUTROABS 7.7  --  4.8  --   HGB 10.1* 10.5* 8.8* 8.7*  HCT 32.3* 31.0* 27.9* 28.3*  MCV 93.1  --  93.0 93.4  PLT 493*  --  453* 789*   Basic Metabolic Panel:  Recent Labs Lab 11/03/16 1624 11/03/16 1718 11/03/16 2328 11/04/16 0240 11/04/16 0512  NA 138 139 140  --  141  K 3.8 3.9  3.4*  --  3.8  CL 99* 99* 106  --  106  CO2 30  --  26  --  25  GLUCOSE 108* 106* 92  --  91  BUN 15 17 12   --  11  CREATININE 1.15* 1.00 1.03*  --  0.92  CALCIUM 8.7*  --  8.0*  --  7.9*  MG  --   --   --   --  1.7  PHOS  --   --   --  3.2  --    GFR: Estimated Creatinine Clearance: 46.9 mL/min (by C-G formula based on SCr of 0.92 mg/dL). Liver Function Tests:  Recent Labs Lab 11/03/16 1624 11/03/16 2328 11/04/16 0512  AST 35 29 25  ALT 22 20 18   ALKPHOS 181* 160* 148*  BILITOT 1.3* 1.2 1.1  PROT 6.4* 5.8* 5.4*  ALBUMIN 2.3* 1.9* 1.8*   No results for input(s): LIPASE, AMYLASE in the last 168 hours.  Recent Labs Lab 11/03/16 1624  AMMONIA 22   Coagulation Profile: No results for input(s): INR, PROTIME in the last 168 hours. Cardiac Enzymes: No results for input(s): CKTOTAL, CKMB, CKMBINDEX, TROPONINI in the last 168 hours. BNP (last 3 results) No results for input(s): PROBNP in the last 8760 hours. HbA1C: No results for input(s): HGBA1C in the last 72 hours. CBG:  Recent Labs Lab 11/03/16 1712  GLUCAP 103*   Lipid Profile: No results for input(s): CHOL, HDL, LDLCALC, TRIG, CHOLHDL, LDLDIRECT in the last 72 hours. Thyroid Function Tests:  Recent Labs  11/04/16 0240  TSH 1.200   Anemia Panel: No results for input(s): VITAMINB12, FOLATE, FERRITIN, TIBC, IRON, RETICCTPCT in the last 72 hours. Sepsis Labs:  Recent Labs Lab 11/03/16 1719 11/03/16 2328 11/04/16 0240  PROCALCITON  --  0.75  --   LATICACIDVEN 1.36 0.9 0.7    Recent Results (from the past 240 hour(s))  Blood culture (routine x 2)     Status: None (Preliminary result)   Collection Time: 11/03/16  4:28 PM  Result Value Ref Range Status   Specimen Description BLOOD LEFT ANTECUBITAL  Final   Special Requests   Final    BOTTLES DRAWN AEROBIC AND ANAEROBIC Blood Culture adequate volume   Culture NO GROWTH 3 DAYS  Final   Report Status PENDING  Incomplete  Urine culture     Status:  Abnormal (Preliminary result)   Collection Time: 11/03/16  4:40 PM  Result Value Ref Range Status   Specimen Description URINE, RANDOM  Final   Special Requests NONE  Final   Culture (A)  Final    >=100,000 COLONIES/mL PSEUDOMONAS AERUGINOSA CULTURE REINCUBATED FOR BETTER GROWTH    Report Status PENDING  Incomplete   Organism ID, Bacteria PSEUDOMONAS AERUGINOSA (A)  Final      Susceptibility   Pseudomonas aeruginosa - MIC*    CEFTAZIDIME 16 INTERMEDIATE Intermediate     CIPROFLOXACIN 0.5 SENSITIVE Sensitive     GENTAMICIN 2 SENSITIVE Sensitive     IMIPENEM 1 SENSITIVE Sensitive     PIP/TAZO 32 SENSITIVE Sensitive  CEFEPIME 16 INTERMEDIATE Intermediate     * >=100,000 COLONIES/mL PSEUDOMONAS AERUGINOSA  Blood culture (routine x 2)     Status: None (Preliminary result)   Collection Time: 11/03/16  5:03 PM  Result Value Ref Range Status   Specimen Description BLOOD RIGHT FOREARM  Final   Special Requests   Final    BOTTLES DRAWN AEROBIC AND ANAEROBIC Blood Culture adequate volume   Culture NO GROWTH 3 DAYS  Final   Report Status PENDING  Incomplete  MRSA PCR Screening     Status: None   Collection Time: 11/04/16  2:07 AM  Result Value Ref Range Status   MRSA by PCR NEGATIVE NEGATIVE Final    Comment:        The GeneXpert MRSA Assay (FDA approved for NASAL specimens only), is one component of a comprehensive MRSA colonization surveillance program. It is not intended to diagnose MRSA infection nor to guide or monitor treatment for MRSA infections.          Radiology Studies: Dg Swallowing Func-speech Pathology  Result Date: 11/04/2016 Objective Swallowing Evaluation: Type of Study: MBS-Modified Barium Swallow Study Patient Details Name: JENNILEE DEMARCO MRN: [id number] Date of Birth: Oct 19, 1930 Today's Date: 11/04/2016 Time: SLP Start Time (ACUTE ONLY): 1044-SLP Stop Time (ACUTE ONLY): 1111 SLP Time Calculation (min) (ACUTE ONLY): 27 min Past Medical History: Past  Medical History: Diagnosis Date . Anxiety  . Arthritis  . Chronic kidney disease   kidney stones . History of hiatal hernia  . History of kidney stones  . Macular degeneration  . Pneumonia   hx. of . Urolithiasis  . Varicose veins   bilateral LE   Past Surgical History: Past Surgical History: Procedure Laterality Date . ABDOMINAL HYSTERECTOMY   . BUNIONECTOMY Right  . CYSTOSCOPY W/ URETERAL STENT REMOVAL Right 10/26/2016  Procedure: CYSTOSCOPY WITH STENT REMOVAL;  Surgeon: Irine Seal, MD;  Location: [facility];  Service: Urology;  Laterality: Right; . CYSTOSCOPY WITH STENT PLACEMENT Right 08/15/2014  Procedure: CYSTOSCOPY WITH RIGHT STENT PLACEMENT;  Surgeon: [name], MD;  Location: [facility];  Service: Urology;  Laterality: Right; . CYSTOSCOPY WITH STENT PLACEMENT Right 10/02/2016  Procedure: CYSTOSCOPY, RIGHT RETROGRADE WITH RIGHT URETERAL STENT PLACEMENT;  Surgeon: Irine Seal, MD;  Location: [facility];  Service: Urology;  Laterality: Right; . CYSTOSCOPY/URETEROSCOPY/HOLMIUM LASER/STENT PLACEMENT Right 10/19/2016  Procedure: CYSTOSCOPY/URETEROSCOPY/HOLMIUM LASER/STENT EXCHANGE;  Surgeon: Irine Seal, MD;  Location: [facility];  Service: Urology;  Laterality: Right;  ONLY NEEDS 60 MINUTES FOR PROCEDURE . EXTRACORPOREAL SHOCK WAVE LITHOTRIPSY Right 08/18/2014 . JOINT REPLACEMENT Right   Total hip replacement . JOINT REPLACEMENT Right   Total knee replacement . ORIF WRIST FRACTURE Left  . TONSILLECTOMY   HPI: TAMBRIA PFANNENSTIEL is a 83 y.o. female with medical history significant of kidney stones, macular degeneration, varicose veins, OA admitted for confusion, weakness, and FTT. Previous BSE on 10/15 reports poor appetite with no signs of oropharyngeal or esophageal concerns. Esophogram on 07/08/2005 revealed large hiatal herneria without stricture or reflux and mild decrease in esophageal peristalsis. CXR 10/24 clear.  Subjective: pt awake in bed, son Timmothy Sours present Assessment / Plan / Recommendation CHL IP CLINICAL IMPRESSIONS  11/04/2016 Clinical Impression Pt demonstrates mild oral dysphagia marked by piecemeal bolus pattern, decreased tongue based retraction leading to intermittent and mild residue. Moderate-severe motor based pharyngeal dysphagia leading to incomplete epiglottic inversion resulting in penetration during the swallow of thin to the cords with delayed reflexive cough. Additional aspiration present with chin tuck strategy. Inconsistent flash  laryngeal penetration noted with nectar-thick barium. Residue in the valleculae due to poor epiglottic deflection and lingual residue. Pt required approximately 5-6 swallows in order to clear pharynx of small single sips likely due to suspected insufficient UES opening for passage of entire bolus into esophagus. Moderate-severe pyriform residue consistent throughout study, likely caused by decreased pharyngeal constriction and inability to clear despite attempts of liquid wash and verbal cues for effortful swallows. Cricopharyngeal bar noted. Esophageal sweep performed; did not note overt abnormalities (MBS does not diagnose below the level of the UES). With history of esophageal dysfunction, further esophageal workup may be warranted.  Pt may benefit from RMST for potential improvement in laryngeal strength for swallowing; CIR suggested. Recommend Dys 3 (mech soft) diet with nectar-thick liquids, multiple swallows, alternate liquids and solids, meds crushed in puree and NO STRAWS. Will continue ST intervention for dysphagia management.  SLP Visit Diagnosis Dysphagia, oropharyngeal phase (R13.12) Attention and concentration deficit following -- Frontal lobe and executive function deficit following -- Impact on safety and function Moderate aspiration risk;Severe aspiration risk   CHL IP TREATMENT RECOMMENDATION 11/04/2016 Treatment Recommendations Therapy as outlined in treatment plan below   Prognosis 11/04/2016 Prognosis for Safe Diet Advancement Fair Barriers to Reach Goals Severity  of deficits Barriers/Prognosis Comment -- CHL IP DIET RECOMMENDATION 11/04/2016 SLP Diet Recommendations Dysphagia 3 (Mech soft) solids;Nectar thick liquid Liquid Administration via Cup Medication Administration Crushed with puree Compensations Slow rate;Small sips/bites;Multiple dry swallows after each bite/sip;Other (Comment) Postural Changes Remain semi-upright after after feeds/meals (Comment);Seated upright at 90 degrees   CHL IP OTHER RECOMMENDATIONS 11/04/2016 Recommended Consults Consider esophageal assessment Oral Care Recommendations Oral care BID Other Recommendations Order thickener from pharmacy   CHL IP FOLLOW UP RECOMMENDATIONS 11/04/2016 Follow up Recommendations Inpatient Rehab   CHL IP FREQUENCY AND DURATION 11/04/2016 Speech Therapy Frequency (ACUTE ONLY) min 2x/week Treatment Duration 2 weeks      CHL IP ORAL PHASE 11/04/2016 Oral Phase Impaired Oral - Pudding Teaspoon -- Oral - Pudding Cup -- Oral - Honey Teaspoon -- Oral - Honey Cup -- Oral - Nectar Teaspoon -- Oral - Nectar Cup Piecemeal swallowing;Lingual/palatal residue Oral - Nectar Straw -- Oral - Thin Teaspoon -- Oral - Thin Cup Piecemeal swallowing;Lingual/palatal residue Oral - Thin Straw -- Oral - Puree -- Oral - Mech Soft -- Oral - Regular Piecemeal swallowing;Lingual/palatal residue Oral - Multi-Consistency -- Oral - Pill -- Oral Phase - Comment --  CHL IP PHARYNGEAL PHASE 11/04/2016 Pharyngeal Phase Impaired Pharyngeal- Pudding Teaspoon -- Pharyngeal -- Pharyngeal- Pudding Cup -- Pharyngeal -- Pharyngeal- Honey Teaspoon -- Pharyngeal -- Pharyngeal- Honey Cup -- Pharyngeal -- Pharyngeal- Nectar Teaspoon -- Pharyngeal -- Pharyngeal- Nectar Cup Penetration/Aspiration during swallow;Reduced pharyngeal peristalsis;Reduced epiglottic inversion;Reduced tongue base retraction;Pharyngeal residue - valleculae;Pharyngeal residue - pyriform;Pharyngeal residue - cp segment Pharyngeal Material enters airway, CONTACTS cords and then ejected out  Pharyngeal- Nectar Straw -- Pharyngeal -- Pharyngeal- Thin Teaspoon -- Pharyngeal -- Pharyngeal- Thin Cup Reduced airway/laryngeal closure;Reduced pharyngeal peristalsis;Reduced epiglottic inversion;Reduced tongue base retraction;Penetration/Aspiration during swallow;Pharyngeal residue - valleculae;Pharyngeal residue - pyriform;Pharyngeal residue - cp segment;Compensatory strategies attempted (with notebox) Pharyngeal Material enters airway, passes BELOW cords and not ejected out despite cough attempt by patient Pharyngeal- Thin Straw -- Pharyngeal -- Pharyngeal- Puree -- Pharyngeal -- Pharyngeal- Mechanical Soft -- Pharyngeal -- Pharyngeal- Regular Reduced pharyngeal peristalsis;Reduced epiglottic inversion;Reduced tongue base retraction;Pharyngeal residue - valleculae;Pharyngeal residue - pyriform;Pharyngeal residue - cp segment Pharyngeal -- Pharyngeal- Multi-consistency -- Pharyngeal -- Pharyngeal- Pill -- Pharyngeal -- Pharyngeal Comment --  No  flowsheet data found. No flowsheet data found. Houston Siren 11/04/2016, 3:23 PM Orbie Pyo Colvin [name].Ed CCC-SLP Pager [phone]                   Scheduled Meds: . dronabinol  2.5 mg Oral BID AC  . enoxaparin (LOVENOX) injection  40 mg Subcutaneous Q24H  . feeding supplement (ENSURE ENLIVE)  237 mL Oral BID BM  . latanoprost  1 drop Both Eyes QHS  . terazosin  1 mg Oral QHS  . thiamine  100 mg Intravenous Daily   Continuous Infusions: . sodium chloride 75 mL/hr at 11/05/16 2107  . ciprofloxacin 400 mg (11/06/16 1042)  . fluconazole (DIFLUCAN) IV Stopped (11/06/16 0124)     LOS: 3 days    Sonny Poth Tanna Furry, MD [facility] Pager [phone]  If 7PM-7AM, please contact night-coverage [email or web address] Password [email or web address] 11/06/2016, 10:49 AM

## 2016-11-06 NOTE — Progress Notes (Signed)
Daughter brought in [place] of attorney documents; copy made and placed in chart. Original copy given back to pt.

## 2016-11-07 LAB — URINE CULTURE

## 2016-11-07 MED ORDER — VITAMIN B-1 100 MG PO TABS
100.0000 mg | ORAL_TABLET | Freq: Every day | ORAL | Status: DC
  Filled 2016-11-07: qty 1

## 2016-11-07 NOTE — Progress Notes (Signed)
PROGRESS NOTE    Caroline Griffith  SWN:[id number] DOB: [birth year] DOA: 11/03/2016 PCP: Lavone Orn, MD   Brief Narrative: 83 year old female with recent urological procedure related with renal calculi, UTI, fungal me on fluconazole daily October 30 presented from nursing home for confusion, generalized fatigue and lethargy. Patient was found to have UTI, fever, mild leukocytosis in the ER. Started on antibiotics and admitted for further evaluation.  Patient is clinically stable. Continue current medical and supportive care. Waiting for inpatient rehabilitation admission.   Assessment & Plan:  # Recurrent urinary tract infection: Patient with a recent urological procedure. The urine culture growing pseudomonas. Continue ciprofloxacin. I do not think patient was septic.  #Candidemia from recent admission: Continue fluconazole. The last dose on October 30. Patient seems clinically improving.  #Acute metabolic encephalopathy likely in the setting of UTI: Patient's mental status improved and around baseline. No focal neurological deficit. Continue to monitor.  #Physical deconditioning: Patient and her daughter like to pursue rehabilitation in the hospital and wanted to home on discharge with her daughter. Inpatient rehabilitation consult requested. Now waiting for CIR bed acceptance. Pt is medically stable to go to rehab.  #CT scan reported as left lower lobe density. Patient has no pulmonary symptoms and do not think this is pneumonia, likely atelectasis. She is not hypoxic. Continue supportive care.  #Likely moderate protein calorie malnutrition: Dietary referral. Encourage oral intake. Patient has decreased oral intake for about a month now.  # Normocytic anemia multifactorial etiology including iron deficiency anemia and anemia of chronic kidney disease: no iron because of infection. Hemoglobin stable. No sign of bleeding. Continue to monitor.   DVT prophylaxis:lovenox sq Code  Status:DNR Family Communication:discussed with the patient's daughter yesterday. Disposition Plan:awaiting for to go to inpatient rehabilitation.    Consultants:  rehab  Procedures:none Antimicrobials:ciprofloxacin since October 26. Patient was on cefepime prior to that. On fluconazole. Subjective: Patient was sitting on chair comfortably. Denied headache, tinnitus, nausea vomiting and chest pain.  Objective: Vitals:   11/06/16 1004 11/06/16 1721 11/06/16 2206 11/07/16 0532  BP: (!) 134/51 (!) 85/60 137/66 (!) 151/73  Pulse: 87 (!) 106 99 (!) 101  Resp: 18 18 18 18   Temp: 98.1 F (36.7 C) 98 F (36.7 C) 98.2 F (36.8 C) 98.3 F (36.8 C)  TempSrc: Oral Oral Oral Oral  SpO2: 100% 100% 99% 99%  Weight:   73 kg (161 lb)   Height:        Intake/Output Summary (Last 24 hours) at 11/07/16 1015 Last data filed at 11/07/16 0533  Gross per 24 hour  Intake             2065 ml  Output              500 ml  Net             1565 ml   Filed Weights   11/04/16 2047 11/05/16 2220 11/06/16 2206  Weight: 72.5 kg (159 lb 13.3 oz) 72.6 kg (160 lb) 73 kg (161 lb)    Examination:  General exam:pleasant female sitting on chair comfortably, not in distress  Respiratory system: lear bilateral, respiratory effort normal Cardiovascular system: regular rate rhythm, S1-S2 normal. No pedal edema. Gastrointestinal system: Abdomen is nondistended, soft and nontender. Normal bowel sounds heard. Central nervous system: Alert and oriented. No focal neurological deficits. Skin: No rashes, lesions or ulcers Psychiatry: Judgement and insight appear normal/age appropriate     Data Reviewed: I have personally reviewed following labs and  imaging studies  CBC:  Recent Labs Lab 11/03/16 1624 11/03/16 1718 11/03/16 2328 11/04/16 0512  WBC 10.3  --  7.5 6.6  NEUTROABS 7.7  --  4.8  --   HGB 10.1* 10.5* 8.8* 8.7*  HCT 32.3* 31.0* 27.9* 28.3*  MCV 93.1  --  93.0 93.4  PLT 493*  --  453* 433*    Basic Metabolic Panel:  Recent Labs Lab 11/03/16 1624 11/03/16 1718 11/03/16 2328 11/04/16 0240 11/04/16 0512  NA 138 139 140  --  141  K 3.8 3.9 3.4*  --  3.8  CL 99* 99* 106  --  106  CO2 30  --  26  --  25  GLUCOSE 108* 106* 92  --  91  BUN 15 17 12   --  11  CREATININE 1.15* 1.00 1.03*  --  0.92  CALCIUM 8.7*  --  8.0*  --  7.9*  MG  --   --   --   --  1.7  PHOS  --   --   --  3.2  --    GFR: Estimated Creatinine Clearance: 47.1 mL/min (by C-G formula based on SCr of 0.92 mg/dL). Liver Function Tests:  Recent Labs Lab 11/03/16 1624 11/03/16 2328 11/04/16 0512  AST 35 29 25  ALT 22 20 18   ALKPHOS 181* 160* 148*  BILITOT 1.3* 1.2 1.1  PROT 6.4* 5.8* 5.4*  ALBUMIN 2.3* 1.9* 1.8*   No results for input(s): LIPASE, AMYLASE in the last 168 hours.  Recent Labs Lab 11/03/16 1624  AMMONIA 22   Coagulation Profile: No results for input(s): INR, PROTIME in the last 168 hours. Cardiac Enzymes: No results for input(s): CKTOTAL, CKMB, CKMBINDEX, TROPONINI in the last 168 hours. BNP (last 3 results) No results for input(s): PROBNP in the last 8760 hours. HbA1C: No results for input(s): HGBA1C in the last 72 hours. CBG:  Recent Labs Lab 11/03/16 1712  GLUCAP 103*   Lipid Profile: No results for input(s): CHOL, HDL, LDLCALC, TRIG, CHOLHDL, LDLDIRECT in the last 72 hours. Thyroid Function Tests: No results for input(s): TSH, T4TOTAL, FREET4, T3FREE, THYROIDAB in the last 72 hours. Anemia Panel: No results for input(s): VITAMINB12, FOLATE, FERRITIN, TIBC, IRON, RETICCTPCT in the last 72 hours. Sepsis Labs:  Recent Labs Lab 11/03/16 1719 11/03/16 2328 11/04/16 0240  PROCALCITON  --  0.75  --   LATICACIDVEN 1.36 0.9 0.7    Recent Results (from the past 240 hour(s))  Blood culture (routine x 2)     Status: None (Preliminary result)   Collection Time: 11/03/16  4:28 PM  Result Value Ref Range Status   Specimen Description BLOOD LEFT ANTECUBITAL  Final    Special Requests   Final    BOTTLES DRAWN AEROBIC AND ANAEROBIC Blood Culture adequate volume   Culture NO GROWTH 3 DAYS  Final   Report Status PENDING  Incomplete  Urine culture     Status: Abnormal   Collection Time: 11/03/16  4:40 PM  Result Value Ref Range Status   Specimen Description URINE, RANDOM  Final   Special Requests NONE  Final   Culture (A)  Final    >=100,000 COLONIES/mL PSEUDOMONAS AERUGINOSA >=100,000 COLONIES/mL ENTEROCOCCUS FAECALIS    Report Status 11/07/2016 FINAL  Final   Organism ID, Bacteria PSEUDOMONAS AERUGINOSA (A)  Final   Organism ID, Bacteria ENTEROCOCCUS FAECALIS (A)  Final      Susceptibility   Enterococcus faecalis - MIC*    AMPICILLIN <=2 SENSITIVE Sensitive  LEVOFLOXACIN 2 SENSITIVE Sensitive     NITROFURANTOIN <=16 SENSITIVE Sensitive     VANCOMYCIN 1 SENSITIVE Sensitive     * >=100,000 COLONIES/mL ENTEROCOCCUS FAECALIS   Pseudomonas aeruginosa - MIC*    CEFTAZIDIME 16 INTERMEDIATE Intermediate     CIPROFLOXACIN 0.5 SENSITIVE Sensitive     GENTAMICIN 2 SENSITIVE Sensitive     IMIPENEM 1 SENSITIVE Sensitive     PIP/TAZO 32 SENSITIVE Sensitive     CEFEPIME 16 INTERMEDIATE Intermediate     * >=100,000 COLONIES/mL PSEUDOMONAS AERUGINOSA  Blood culture (routine x 2)     Status: None (Preliminary result)   Collection Time: 11/03/16  5:03 PM  Result Value Ref Range Status   Specimen Description BLOOD RIGHT FOREARM  Final   Special Requests   Final    BOTTLES DRAWN AEROBIC AND ANAEROBIC Blood Culture adequate volume   Culture NO GROWTH 3 DAYS  Final   Report Status PENDING  Incomplete  MRSA PCR Screening     Status: None   Collection Time: 11/04/16  2:07 AM  Result Value Ref Range Status   MRSA by PCR NEGATIVE NEGATIVE Final    Comment:        The GeneXpert MRSA Assay (FDA approved for NASAL specimens only), is one component of a comprehensive MRSA colonization surveillance program. It is not intended to diagnose MRSA infection nor to  guide or monitor treatment for MRSA infections.          Radiology Studies: No results found.      Scheduled Meds: . dronabinol  2.5 mg Oral BID AC  . enoxaparin (LOVENOX) injection  40 mg Subcutaneous Q24H  . feeding supplement (ENSURE ENLIVE)  237 mL Oral BID BM  . latanoprost  1 drop Both Eyes QHS  . terazosin  1 mg Oral QHS  . thiamine  100 mg Intravenous Daily   Continuous Infusions: . sodium chloride 75 mL/hr at 11/07/16 0907  . ciprofloxacin Stopped (11/06/16 2311)  . fluconazole (DIFLUCAN) IV Stopped (11/07/16 0120)     LOS: 4 days    Valeri Sula Tanna Furry, MD [facility] Pager [phone]  If 7PM-7AM, please contact night-coverage [email or web address] Password [email or web address] 11/07/2016, 10:15 AM

## 2016-11-08 LAB — CULTURE, BLOOD (ROUTINE X 2)
CULTURE: NO GROWTH
Culture: NO GROWTH
SPECIAL REQUESTS: ADEQUATE
SPECIAL REQUESTS: ADEQUATE

## 2016-11-08 MED ORDER — FLUCONAZOLE 40 MG/ML PO SUSR: 400.0000 mg | Freq: Every day | ORAL | 0 refills | Status: AC

## 2016-11-08 MED ORDER — CIPROFLOXACIN 500 MG/5ML (10%) PO SUSR: 500.0000 mg | Freq: Two times a day (BID) | ORAL | 0 refills | Status: AC

## 2016-11-08 MED ORDER — FLUCONAZOLE 40 MG/ML PO SUSR: 400.0000 mg | Freq: Every day | ORAL | Status: DC

## 2016-11-08 MED ORDER — CIPROFLOXACIN 500 MG/5ML (10%) PO SUSR
500.0000 mg | Freq: Two times a day (BID) | ORAL | Status: DC
  Filled 2016-11-08: qty 5

## 2016-11-08 NOTE — Progress Notes (Signed)
   11/08/16 1700  PT Visit Information  Last PT Received On 11/08/16  Assistance Needed +2  History of Present Illness Caroline Griffith is a 83 y.o. female with medical history significant of kidney stones, macular degeneration, varicose veins, OA. admitted for confusion, weakness, and FTT.  Subjective Data  Subjective I am really tired, don't think I can try to walk  Patient Stated Goal to get stronger in rehab  Precautions  Precautions Fall  Precaution Comments incontinent   Restrictions  Weight Bearing Restrictions No  Pain Assessment  Pain Assessment Faces  Faces Pain Scale 6  Pain Location R lower leg  Pain Descriptors / Indicators Sore;Tender  Pain Intervention(s) Limited activity within patient's tolerance;Monitored during session;Premedicated before session;Repositioned  Cognition  Arousal/Alertness Awake/alert  Behavior During Therapy WFL for tasks assessed/performed  Overall Cognitive Status Within Functional Limits for tasks assessed  Current Attention Level Sustained  Memory Decreased short-term memory  Bed Mobility  General bed mobility comments up when PT arrived  Transfers  Overall transfer level Needs assistance  General transfer comment in chair and declined to get up  Exercises  Exercises General Lower Extremity  General Exercises - Lower Extremity  Ankle Circles/Pumps AROM;AAROM;Both;5 reps  Quad Sets AROM;Both;10 reps  Heel Slides AROM;Both;10 reps;Strengthening  Hip ABduction/ADduction AROM;AAROM;Both;10 reps  Gluteal Sets AROM;Both;10 reps  Long Arc Quad Strengthening;Both;10 reps  Hip Flexion/Marching AROM;Both;10 reps  PT - End of Session  Activity Tolerance Patient tolerated treatment well  Patient left in chair;with call bell/phone within reach;with chair alarm set;with family/visitor present  Nurse Communication Mobility status  PT - Assessment/Plan  PT Plan Current plan remains appropriate  PT Visit Diagnosis Muscle weakness (generalized)  (M62.81);Difficulty in walking, not elsewhere classified (R26.2)  PT Frequency (ACUTE ONLY) Min 3X/week  Recommendations for Other Services Rehab consult  Follow Up Recommendations CIR  PT equipment Other (comment) (Defer to rehab setting)  AM-PAC PT "6 Clicks" Daily Activity Outcome Measure  Difficulty turning over in bed (including adjusting bedclothes, sheets and blankets)? 1  Difficulty moving from lying on back to sitting on the side of the bed?  1  Difficulty sitting down on and standing up from a chair with arms (e.g., wheelchair, bedside commode, etc,.)? 1  Help needed moving to and from a bed to chair (including a wheelchair)? 2  Help needed walking in hospital room? 2  Help needed climbing 3-5 steps with a railing?  1  6 Click Score 8  Mobility G Code  CM  PT Goal Progression  Progress towards PT goals Progressing toward goals  PT Time Calculation  PT Start Time (ACUTE ONLY) 1231  PT Stop Time (ACUTE ONLY) 1251  PT Time Calculation (min) (ACUTE ONLY) 20 min  PT G-Codes **NOT FOR INPATIENT CLASS**  Functional Assessment Tool Used AM-PAC 6 Clicks Basic Mobility  PT General Charges  $$ ACUTE PT VISIT 1 Visit  PT Treatments  $Therapeutic Exercise 8-22 mins    Mee Hives, PT MS Acute Rehab Dept. Number: [place]

## 2016-11-08 NOTE — Care Management Note (Addendum)
Case Management Note  Patient Details  Name: Caroline Griffith MRN: [id number] Date of Birth: 28-Feb-1930  Subjective/Objective:        CM following for progression and d/c planning.             Action/Plan: 11/08/2016 Pt for d/c today, HH vs SNF if pt can be placed at [facility] . CSW, Shelle Iron  Working with pt and family.  This CM ready to to arrange [facility] services after discussion with pt and family friend, Theodis Blaze. Pt has no DME needs. This CM will arrange HHRN, HHPT, HHOT if recommended and [place] aide if ordered. Will continue to follow.  [facility] will provide [facility] services for this pt and come to discuss pt copays with the pt. Services will begin in 48 hr .  No DME needs. Pt has walker and elevated commode seat.   Expected Discharge Date:    11/08/2016              Expected Discharge Plan:  Home with [facility] [place] services.  In-House Referral:  Clinical Social Work  Discharge planning Services  CM Consult  Post Acute Care Choice:  NA Choice offered to:  Adult Children  DME Arranged:   NA DME Agency:   NA  HH Arranged:   HHRN, PT, OT and [place] aide [place] Agency:   [facility]  Status of Service:  Complete  If discussed at [employer], dates discussed:    Additional Comments:  Adron Bene, RN 11/08/2016, 10:22 AM

## 2016-11-08 NOTE — Progress Notes (Addendum)
Pharmacy Antibiotic Note Caroline Griffith is a 83 y.o. female admitted on 11/03/2016 with UTI. Pharmacy has been consulted for ciprofloxacin dosing.  Plan: -Change ciprofloxacin to 500mg  PO suspension BID -Recommend 5-7 days total of treatment (Today is D#5) -Remains on fluconazole 400mg  q24h for recent hx of C.albicans fungemia - changed to suspension and stop date of 10/30 added -Pharmacy to sign off as no dose adjustments anticipated and patient to discharge soon  Height: 5' 6.5" (168.9 cm) Weight: 161 lb (73 kg) IBW/kg (Calculated) : 60.45  Temp (24hrs), Avg:98.4 F (36.9 C), Min:97.3 F (36.3 C), Max:99.1 F (37.3 C)   Recent Labs Lab 11/03/16 1624 11/03/16 1718 11/03/16 1719 11/03/16 2328 11/04/16 0240 11/04/16 0512  WBC 10.3  --   --  7.5  --  6.6  CREATININE 1.15* 1.00  --  1.03*  --  0.92  LATICACIDVEN  --   --  1.36 0.9 0.7  --     Estimated Creatinine Clearance: 47.1 mL/min (by C-G formula based on SCr of 0.92 mg/dL).    No Known Allergies  Antimicrobials this admission: Cefepime 10/25 >>10/26 Ciprofloxacin 10/26 >>  Fluconazole PTA (fungemia) >> (10/30) Vanc 10/24 x 1 Zosyn 10/24 x 1   Microbiology results: 10/24 UCx: P.aeruginosa; intermediate to Cefepime and Ceftazidime and S to FQ's  10/24 BCx: ngtd 10/25 MRSA PCR: neg  Thank you for allowing pharmacy to be a part of this patient's care.  Damarkus Balis D. Dorena Dorfman, PharmD, [place] Clinical Pharmacist Pager: [phone] Clinical Phone for 11/08/2016 until 3:30pm: [phone] If after 3:30pm, please call main pharmacy at [phone] 11/08/2016 11:43 AM

## 2016-11-08 NOTE — Discharge Summary (Signed)
Physician Discharge Summary  Caroline Griffith OMV:[id number] DOB: [birth year] DOA: 11/03/2016  PCP: Lavone Orn, MD  Admit date: 11/03/2016 Discharge date: 11/08/2016  Admitted From:SNF Disposition: home with home care services  Recommendations for Outpatient Follow-up:  1. Follow up with PCP in 1-2 weeks 2. Please obtain BMP/CBC in one week   Home Health:yes Equipment/Devices:none Discharge Condition:stable CODE STATUS:DNR Diet recommendation:Diet recommendations: Dysphagia 3 (mechanical soft);Nectar-thick liquid Liquids provided via: Cup;No straw Medication Administration: Crushed with puree Supervision: Patient able to self feed;Full supervision/cueing for compensatory strategies Compensations: Slow rate;Small sips/bites;Multiple dry swallows after each bite/sip;Other (Comment);Hard cough after swallow Postural Changes and/or Swallow Maneuvers: Seated upright 90 degrees  Brief/Interim Summary: 83 year old female with recent urological procedure related with renal calculi, UTI, fungal me on fluconazole daily October 30 presented from nursing home for confusion, generalized fatigue and lethargy. Patient was found to have UTI, fever, mild leukocytosis in the ER. Started on antibiotics and admitted for further evaluation.  # Recurrent urinary tract infection: Patient with a recent urological procedure. The urine culture growing pseudomonas.  I do not think patient was septic.Contiue cipro for 3 more days  #Candidemia from recent admission: Continue fluconazole. The last dose on October 30. Patient seems clinically improving.  #Acute metabolic encephalopathy likely in the setting of UTI: Patient's mental status improved and around baseline. No focal neurological deficit. Continue to monitor.  #Physical deconditioning: family declined SNF and wants to take pt home with home care sevices. D/w CM, SW   #CT scan reported as left lower lobe density. Patient has no pulmonary symptoms  and do not think this is pneumonia, likely atelectasis. She is not hypoxic. Continue supportive care.  #Likely moderate protein calorie malnutrition: Dietary referral. Encourage oral intake. Patient has decreased oral intake for about a month now. On dysphagia diet  # Normocytic anemia multifactorial etiology including iron deficiency anemia and anemia of chronic kidney disease: no iron because of infection. Hemoglobin stable. No sign of bleeding.  Patient is clinically stable. Discharge home with home care services as per patient, her daughter's request. Home care services arranged.    Discharge Diagnoses:  Active Problems:   Anemia   Fungemia: Candida   Acute lower UTI   Acute encephalopathy   Sepsis (HCC)   Physical deconditioning   AKI (acute kidney injury) (HCC)   Dehydration   Hypoalbuminemia   HCAP (healthcare-associated pneumonia)   Prolonged QT interval   Chronic kidney disease    Discharge Instructions  Discharge Instructions    Call MD for:  difficulty breathing, headache or visual disturbances    Complete by:  As directed    Call MD for:  extreme fatigue    Complete by:  As directed    Call MD for:  hives    Complete by:  As directed    Call MD for:  persistant dizziness or light-headedness    Complete by:  As directed    Call MD for:  persistant nausea and vomiting    Complete by:  As directed    Call MD for:  severe uncontrolled pain    Complete by:  As directed    Call MD for:  temperature >100.4    Complete by:  As directed    Diet - low sodium heart healthy    Complete by:  As directed    Diet recommendations: Dysphagia 3 (mechanical soft);Nectar-thick liquid Liquids provided via: Cup;No straw Medication Administration: Crushed with puree Supervision: Patient able to self feed;Full supervision/cueing for compensatory strategies  Compensations: Slow rate;Small sips/bites;Multiple dry swallows after each bite/sip;Other (Comment);Hard cough after  swallow Postural Changes and/or Swallow Maneuvers: Seated upright 90 degrees   Increase activity slowly    Complete by:  As directed      Allergies as of 11/08/2016   No Known Allergies     Medication List    TAKE these medications   acetaminophen 325 MG tablet Commonly known as:  TYLENOL Take 325 mg by mouth every 6 (six) hours as needed for moderate pain or headache.   bisacodyl 5 MG EC tablet Commonly known as:  DULCOLAX Take 1 tablet (5 mg total) by mouth daily as needed for moderate constipation.   ciprofloxacin 500 MG/5ML (10%) suspension Commonly known as:  CIPRO Take 5 mLs (500 mg total) by mouth 2 (two) times daily.   dronabinol 2.5 MG capsule Commonly known as:  MARINOL Take 1 capsule (2.5 mg total) by mouth 2 (two) times daily before lunch and supper.   feeding supplement (ENSURE ENLIVE) Liqd Take 237 mLs by mouth 3 (three) times daily between meals.   fluconazole 40 MG/ML suspension Commonly known as:  DIFLUCAN Take 10 mLs (400 mg total) by mouth daily.   latanoprost 0.005 % ophthalmic solution Commonly known as:  XALATAN PLACE 1 DROP INTO BOTH EYES ONCE DAILY   loperamide 2 MG capsule Commonly known as:  IMODIUM Take 2 mg by mouth as needed for diarrhea or loose stools.   LORazepam 0.5 MG tablet Commonly known as:  ATIVAN Take 0.5 mg by mouth 2 (two) times daily as needed for anxiety.   mirtazapine 15 MG disintegrating tablet Commonly known as:  REMERON SOL-TAB Take 1 tablet (15 mg total) by mouth at bedtime.   polyethylene glycol packet Commonly known as:  MIRALAX / GLYCOLAX Take 17 g by mouth daily as needed for mild constipation.   senna-docusate 8.6-50 MG tablet Commonly known as:  Senokot-S Take 1 tablet by mouth at bedtime as needed for mild constipation.   terazosin 1 MG capsule Commonly known as:  HYTRIN Take 1 capsule (1 mg total) by mouth at bedtime.   traMADol 50 MG tablet Commonly known as:  ULTRAM Take 1-2 tablets (50-100 mg  total) by mouth every 6 (six) hours as needed for moderate pain or severe pain.      Follow-up Information    Lavone Orn, MD. Schedule an appointment as soon as possible for a visit in 1 week(s).   Specialty:  Internal Medicine Contact information: [address] [place] [address] [phone]          No Known Allergies  Consultations: None  Procedures/Studies: None  Subjective: Seen and examined at bedside. Denied headache, dizziness, nausea vomiting chest pain shortness of breath.  Discharge Exam: Vitals:   11/08/16 0341 11/08/16 0831  BP: 112/90 (!) 154/64  Pulse: 90 88  Resp: 20 18  Temp: (!) 97.3 F (36.3 C) 98.9 F (37.2 C)  SpO2: 99% 99%   Vitals:   11/07/16 1000 11/07/16 2007 11/08/16 0341 11/08/16 0831  BP: 127/74 (!) 127/52 112/90 (!) 154/64  Pulse: 63 95 90 88  Resp: 16 20 20 18   Temp: 98.3 F (36.8 C) 99.1 F (37.3 C) (!) 97.3 F (36.3 C) 98.9 F (37.2 C)  TempSrc: Oral Oral Oral Oral  SpO2: 98% 99% 99% 99%  Weight:      Height:        General: Pt is alert, awake, not in acute distress Cardiovascular: RRR,  S1/S2 +, no rubs, no gallops Respiratory: CTA bilaterally, no wheezing, no rhonchi Abdominal: Soft, NT, ND, bowel sounds + Extremities: no edema, no cyanosis    The results of significant diagnostics from this hospitalization (including imaging, microbiology, ancillary and laboratory) are listed below for reference.     Microbiology: Recent Results (from the past 240 hour(s))  Blood culture (routine x 2)     Status: None   Collection Time: 11/03/16  4:28 PM  Result Value Ref Range Status   Specimen Description BLOOD LEFT ANTECUBITAL  Final   Special Requests   Final    BOTTLES DRAWN AEROBIC AND ANAEROBIC Blood Culture adequate volume   Culture NO GROWTH 5 DAYS  Final   Report Status 11/08/2016 FINAL  Final  Urine culture     Status: Abnormal   Collection Time: 11/03/16  4:40 PM  Result Value Ref Range  Status   Specimen Description URINE, RANDOM  Final   Special Requests NONE  Final   Culture (A)  Final    >=100,000 COLONIES/mL PSEUDOMONAS AERUGINOSA >=100,000 COLONIES/mL ENTEROCOCCUS FAECALIS    Report Status 11/07/2016 FINAL  Final   Organism ID, Bacteria PSEUDOMONAS AERUGINOSA (A)  Final   Organism ID, Bacteria ENTEROCOCCUS FAECALIS (A)  Final      Susceptibility   Enterococcus faecalis - MIC*    AMPICILLIN <=2 SENSITIVE Sensitive     LEVOFLOXACIN 2 SENSITIVE Sensitive     NITROFURANTOIN <=16 SENSITIVE Sensitive     VANCOMYCIN 1 SENSITIVE Sensitive     * >=100,000 COLONIES/mL ENTEROCOCCUS FAECALIS   Pseudomonas aeruginosa - MIC*    CEFTAZIDIME 16 INTERMEDIATE Intermediate     CIPROFLOXACIN 0.5 SENSITIVE Sensitive     GENTAMICIN 2 SENSITIVE Sensitive     IMIPENEM 1 SENSITIVE Sensitive     PIP/TAZO 32 SENSITIVE Sensitive     CEFEPIME 16 INTERMEDIATE Intermediate     * >=100,000 COLONIES/mL PSEUDOMONAS AERUGINOSA  Blood culture (routine x 2)     Status: None   Collection Time: 11/03/16  5:03 PM  Result Value Ref Range Status   Specimen Description BLOOD RIGHT FOREARM  Final   Special Requests   Final    BOTTLES DRAWN AEROBIC AND ANAEROBIC Blood Culture adequate volume   Culture NO GROWTH 5 DAYS  Final   Report Status 11/08/2016 FINAL  Final  MRSA PCR Screening     Status: None   Collection Time: 11/04/16  2:07 AM  Result Value Ref Range Status   MRSA by PCR NEGATIVE NEGATIVE Final    Comment:        The GeneXpert MRSA Assay (FDA approved for NASAL specimens only), is one component of a comprehensive MRSA colonization surveillance program. It is not intended to diagnose MRSA infection nor to guide or monitor treatment for MRSA infections.      Labs: BNP (last 3 results) No results for input(s): BNP in the last 8760 hours. Basic Metabolic Panel:  Recent Labs Lab 11/03/16 1624 11/03/16 1718 11/03/16 2328 11/04/16 0240 11/04/16 0512  NA 138 139 140  --  141   K 3.8 3.9 3.4*  --  3.8  CL 99* 99* 106  --  106  CO2 30  --  26  --  25  GLUCOSE 108* 106* 92  --  91  BUN 15 17 12   --  11  CREATININE 1.15* 1.00 1.03*  --  0.92  CALCIUM 8.7*  --  8.0*  --  7.9*  MG  --   --   --   --  1.7  PHOS  --   --   --  3.2  --    Liver Function Tests:  Recent Labs Lab 11/03/16 1624 11/03/16 2328 11/04/16 0512  AST 35 29 25  ALT 22 20 18   ALKPHOS 181* 160* 148*  BILITOT 1.3* 1.2 1.1  PROT 6.4* 5.8* 5.4*  ALBUMIN 2.3* 1.9* 1.8*   No results for input(s): LIPASE, AMYLASE in the last 168 hours.  Recent Labs Lab 11/03/16 1624  AMMONIA 22   CBC:  Recent Labs Lab 11/03/16 1624 11/03/16 1718 11/03/16 2328 11/04/16 0512  WBC 10.3  --  7.5 6.6  NEUTROABS 7.7  --  4.8  --   HGB 10.1* 10.5* 8.8* 8.7*  HCT 32.3* 31.0* 27.9* 28.3*  MCV 93.1  --  93.0 93.4  PLT 493*  --  453* 433*   Cardiac Enzymes: No results for input(s): CKTOTAL, CKMB, CKMBINDEX, TROPONINI in the last 168 hours. BNP: Invalid input(s): POCBNP CBG:  Recent Labs Lab 11/03/16 1712  GLUCAP 103*   D-Dimer No results for input(s): DDIMER in the last 72 hours. Hgb A1c No results for input(s): HGBA1C in the last 72 hours. Lipid Profile No results for input(s): CHOL, HDL, LDLCALC, TRIG, CHOLHDL, LDLDIRECT in the last 72 hours. Thyroid function studies No results for input(s): TSH, T4TOTAL, T3FREE, THYROIDAB in the last 72 hours.  Invalid input(s): FREET3 Anemia work up No results for input(s): VITAMINB12, FOLATE, FERRITIN, TIBC, IRON, RETICCTPCT in the last 72 hours. Urinalysis    Component Value Date/Time   COLORURINE AMBER (A) 11/03/2016 1645   APPEARANCEUR HAZY (A) 11/03/2016 1645   LABSPEC 1.016 11/03/2016 1645   PHURINE 5.0 11/03/2016 1645   GLUCOSEU NEGATIVE 11/03/2016 1645   HGBUR SMALL (A) 11/03/2016 1645   BILIRUBINUR NEGATIVE 11/03/2016 1645   KETONESUR NEGATIVE 11/03/2016 1645   PROTEINUR 30 (A) 11/03/2016 1645   NITRITE POSITIVE (A) 11/03/2016 1645    LEUKOCYTESUR LARGE (A) 11/03/2016 1645   Sepsis Labs Invalid input(s): PROCALCITONIN,  WBC,  LACTICIDVEN Microbiology Recent Results (from the past 240 hour(s))  Blood culture (routine x 2)     Status: None   Collection Time: 11/03/16  4:28 PM  Result Value Ref Range Status   Specimen Description BLOOD LEFT ANTECUBITAL  Final   Special Requests   Final    BOTTLES DRAWN AEROBIC AND ANAEROBIC Blood Culture adequate volume   Culture NO GROWTH 5 DAYS  Final   Report Status 11/08/2016 FINAL  Final  Urine culture     Status: Abnormal   Collection Time: 11/03/16  4:40 PM  Result Value Ref Range Status   Specimen Description URINE, RANDOM  Final   Special Requests NONE  Final   Culture (A)  Final    >=100,000 COLONIES/mL PSEUDOMONAS AERUGINOSA >=100,000 COLONIES/mL ENTEROCOCCUS FAECALIS    Report Status 11/07/2016 FINAL  Final   Organism ID, Bacteria PSEUDOMONAS AERUGINOSA (A)  Final   Organism ID, Bacteria ENTEROCOCCUS FAECALIS (A)  Final      Susceptibility   Enterococcus faecalis - MIC*    AMPICILLIN <=2 SENSITIVE Sensitive     LEVOFLOXACIN 2 SENSITIVE Sensitive     NITROFURANTOIN <=16 SENSITIVE Sensitive     VANCOMYCIN 1 SENSITIVE Sensitive     * >=100,000 COLONIES/mL ENTEROCOCCUS FAECALIS   Pseudomonas aeruginosa - MIC*    CEFTAZIDIME 16 INTERMEDIATE Intermediate     CIPROFLOXACIN 0.5 SENSITIVE Sensitive     GENTAMICIN 2 SENSITIVE Sensitive     IMIPENEM 1 SENSITIVE Sensitive  PIP/TAZO 32 SENSITIVE Sensitive     CEFEPIME 16 INTERMEDIATE Intermediate     * >=100,000 COLONIES/mL PSEUDOMONAS AERUGINOSA  Blood culture (routine x 2)     Status: None   Collection Time: 11/03/16  5:03 PM  Result Value Ref Range Status   Specimen Description BLOOD RIGHT FOREARM  Final   Special Requests   Final    BOTTLES DRAWN AEROBIC AND ANAEROBIC Blood Culture adequate volume   Culture NO GROWTH 5 DAYS  Final   Report Status 11/08/2016 FINAL  Final  MRSA PCR Screening     Status: None    Collection Time: 11/04/16  2:07 AM  Result Value Ref Range Status   MRSA by PCR NEGATIVE NEGATIVE Final    Comment:        The GeneXpert MRSA Assay (FDA approved for NASAL specimens only), is one component of a comprehensive MRSA colonization surveillance program. It is not intended to diagnose MRSA infection nor to guide or monitor treatment for MRSA infections.      Time coordinating discharge: 28 minutes  SIGNED:   Rosita Fire, MD  [facility] 11/08/2016, 2:27 PM  If 7PM-7AM, please contact night-coverage [email or web address] Password [email or web address]

## 2016-11-08 NOTE — Progress Notes (Signed)
I received a call to my voicemail on Saturday at 1330 of denial for an inpt rehab admission. I have met with pt at bedside and then called her daughter, Stuart, by phone of denial this morning. Daughter is requesting SNF. I have updated SW, Vanessa and we are not going to do a peer to peer. We will sign off at this time. [phone] 

## 2016-11-08 NOTE — Clinical Social Work Note (Signed)
Clinical Social Work Assessment  Patient Details  Name: Caroline Griffith MRN: [id number] Date of Birth: 1930-02-10  Date of referral:  11/04/16               Reason for consult:  Facility Placement (Patient admitted from facility)                Permission sought to share information with:  Family Supports Permission granted to share information::  Yes, Release of Information Signed  Name::     Caroline Griffith::     Relationship::  Family friend  Sport and exercise psychologist Information:  [phone] (mobile)  Housing/Transportation Living arrangements for the past 2 months:  [place] (Patient came from [facility] receiving ST rehab) Source of Information:  Friend/Neighbor Hydrographic surveyor) Patient Interpreter Needed:  None Criminal Activity/Legal Involvement Pertinent to Current Situation/Hospitalization:  No - Comment as needed Significant Relationships:  Adult Children, Friend, Other Family Members Lives with:  Facility Resident (Came from [facility] where patient was receiving ST rehab) Do you feel safe going back to the place where you live?  No (Family/patient does not want her to return to [place]) Need for family participation in patient care:  Yes (Comment)  Care giving concerns:  Patient in need of rehab at a skilled facility per PT/OT and patient/family in agreement if they can get the facility of their choice.  Social Worker assessment / plan:  CSW talked with daughter Keane Scrape [phone]) by phone regarding discharge disposition. CSW advised by daughter Keane Scrape that [facility] is in the room and assisting with placement and CSW can talk with her, as she and her brother have to work. CSW informed that the facility preferences are [facility] and [facility]. Call made to both facilities and they have no bed availability today and this information relayed to Ms. Propst. Home health is the decision and nurse case manager [facility]  notified. Patient will discharge home with [facility] services today.  Employment status:  Retired Forensic scientist:  [employer] ) PT Recommendations:  [place] / Referral to community resources:  Other (Comment Required) (CSW provided with facility preference)  Patient/Family's Response to care:  No concerns expressed by patient or family regarding care during hospitalization.  Patient/Family's Understanding of and Emotional Response to Diagnosis, Current Treatment, and Prognosis: Family appears very knowledgeable about why patient in hospital and the care she will need at discharge.  Emotional Assessment Appearance:  Appears stated age Attitude/Demeanor/Rapport:  Other (Appropriate) Affect (typically observed):  Appropriate, Pleasant Orientation:  Oriented to Self, Oriented to Place, Oriented to Situation Alcohol / Substance use:  Tobacco Use, Alcohol Use, Illicit Drugs (Patient reports that she quit smoking, drinks approx. 4/2 ounces of alcohol per week and does not use illicit drugs) Psych involvement (Current and /or in the community):  No (Comment)  Discharge Needs  Concerns to be addressed:  Discharge Planning Concerns Readmission within the last 30 days:  Yes Current discharge risk:  None Barriers to Discharge:  No Barriers Identified   Sable Feil, LCSW 11/08/2016, 1:51 PM

## 2016-11-08 NOTE — Progress Notes (Signed)
Discharge instructions given to pt's friend over the phone. Patient's friend verbalized understanding and all questions were answered.

## 2016-11-08 NOTE — Care Management Important Message (Signed)
Important Message  Patient Details  Name: Caroline Griffith MRN: [id number] Date of Birth: 1931-01-06   Medicare Important Message Given:  Yes    [name] 11/08/2016, 9:01 AM

## 2016-11-10 ENCOUNTER — Inpatient Hospital Stay: Payer: PPO | Admitting: Internal Medicine

## 2016-11-11 DIAGNOSIS — B965 Pseudomonas (aeruginosa) (mallei) (pseudomallei) as the cause of diseases classified elsewhere: Secondary | ICD-10-CM | POA: Diagnosis not present

## 2016-11-11 DIAGNOSIS — J189 Pneumonia, unspecified organism: Secondary | ICD-10-CM | POA: Diagnosis not present

## 2016-11-11 DIAGNOSIS — A419 Sepsis, unspecified organism: Secondary | ICD-10-CM | POA: Diagnosis not present

## 2016-11-11 DIAGNOSIS — E440 Moderate protein-calorie malnutrition: Secondary | ICD-10-CM | POA: Diagnosis not present

## 2016-11-11 DIAGNOSIS — B377 Candidal sepsis: Secondary | ICD-10-CM | POA: Diagnosis not present

## 2016-11-11 DIAGNOSIS — N390 Urinary tract infection, site not specified: Secondary | ICD-10-CM | POA: Diagnosis not present

## 2016-11-11 DIAGNOSIS — D631 Anemia in chronic kidney disease: Secondary | ICD-10-CM | POA: Diagnosis not present

## 2016-11-11 DIAGNOSIS — M6281 Muscle weakness (generalized): Secondary | ICD-10-CM | POA: Diagnosis not present

## 2016-11-11 DIAGNOSIS — R1310 Dysphagia, unspecified: Secondary | ICD-10-CM | POA: Diagnosis not present

## 2016-11-11 DIAGNOSIS — N189 Chronic kidney disease, unspecified: Secondary | ICD-10-CM | POA: Diagnosis not present

## 2016-11-12 DIAGNOSIS — N189 Chronic kidney disease, unspecified: Secondary | ICD-10-CM | POA: Diagnosis not present

## 2016-11-12 DIAGNOSIS — B377 Candidal sepsis: Secondary | ICD-10-CM | POA: Diagnosis not present

## 2016-11-12 DIAGNOSIS — A419 Sepsis, unspecified organism: Secondary | ICD-10-CM | POA: Diagnosis not present

## 2016-11-12 DIAGNOSIS — M6281 Muscle weakness (generalized): Secondary | ICD-10-CM | POA: Diagnosis not present

## 2016-11-12 DIAGNOSIS — E440 Moderate protein-calorie malnutrition: Secondary | ICD-10-CM | POA: Diagnosis not present

## 2016-11-12 DIAGNOSIS — B965 Pseudomonas (aeruginosa) (mallei) (pseudomallei) as the cause of diseases classified elsewhere: Secondary | ICD-10-CM | POA: Diagnosis not present

## 2016-11-12 DIAGNOSIS — R1310 Dysphagia, unspecified: Secondary | ICD-10-CM | POA: Diagnosis not present

## 2016-11-12 DIAGNOSIS — D631 Anemia in chronic kidney disease: Secondary | ICD-10-CM | POA: Diagnosis not present

## 2016-11-12 DIAGNOSIS — J189 Pneumonia, unspecified organism: Secondary | ICD-10-CM | POA: Diagnosis not present

## 2016-11-12 DIAGNOSIS — N390 Urinary tract infection, site not specified: Secondary | ICD-10-CM | POA: Diagnosis not present

## 2016-11-15 DIAGNOSIS — A419 Sepsis, unspecified organism: Secondary | ICD-10-CM | POA: Diagnosis not present

## 2016-11-15 DIAGNOSIS — M6281 Muscle weakness (generalized): Secondary | ICD-10-CM | POA: Diagnosis not present

## 2016-11-15 DIAGNOSIS — D631 Anemia in chronic kidney disease: Secondary | ICD-10-CM | POA: Diagnosis not present

## 2016-11-15 DIAGNOSIS — J189 Pneumonia, unspecified organism: Secondary | ICD-10-CM | POA: Diagnosis not present

## 2016-11-15 DIAGNOSIS — N390 Urinary tract infection, site not specified: Secondary | ICD-10-CM | POA: Diagnosis not present

## 2016-11-15 DIAGNOSIS — N189 Chronic kidney disease, unspecified: Secondary | ICD-10-CM | POA: Diagnosis not present

## 2016-11-15 DIAGNOSIS — E440 Moderate protein-calorie malnutrition: Secondary | ICD-10-CM | POA: Diagnosis not present

## 2016-11-15 DIAGNOSIS — B377 Candidal sepsis: Secondary | ICD-10-CM | POA: Diagnosis not present

## 2016-11-15 DIAGNOSIS — B965 Pseudomonas (aeruginosa) (mallei) (pseudomallei) as the cause of diseases classified elsewhere: Secondary | ICD-10-CM | POA: Diagnosis not present

## 2016-11-15 DIAGNOSIS — R1310 Dysphagia, unspecified: Secondary | ICD-10-CM | POA: Diagnosis not present

## 2016-11-17 DIAGNOSIS — R1310 Dysphagia, unspecified: Secondary | ICD-10-CM | POA: Diagnosis not present

## 2016-11-17 DIAGNOSIS — D631 Anemia in chronic kidney disease: Secondary | ICD-10-CM | POA: Diagnosis not present

## 2016-11-17 DIAGNOSIS — E440 Moderate protein-calorie malnutrition: Secondary | ICD-10-CM | POA: Diagnosis not present

## 2016-11-17 DIAGNOSIS — M6281 Muscle weakness (generalized): Secondary | ICD-10-CM | POA: Diagnosis not present

## 2016-11-17 DIAGNOSIS — N189 Chronic kidney disease, unspecified: Secondary | ICD-10-CM | POA: Diagnosis not present

## 2016-11-17 DIAGNOSIS — B965 Pseudomonas (aeruginosa) (mallei) (pseudomallei) as the cause of diseases classified elsewhere: Secondary | ICD-10-CM | POA: Diagnosis not present

## 2016-11-17 DIAGNOSIS — N390 Urinary tract infection, site not specified: Secondary | ICD-10-CM | POA: Diagnosis not present

## 2016-11-17 DIAGNOSIS — J189 Pneumonia, unspecified organism: Secondary | ICD-10-CM | POA: Diagnosis not present

## 2016-11-17 DIAGNOSIS — A419 Sepsis, unspecified organism: Secondary | ICD-10-CM | POA: Diagnosis not present

## 2016-11-17 DIAGNOSIS — B377 Candidal sepsis: Secondary | ICD-10-CM | POA: Diagnosis not present

## 2016-11-22 DIAGNOSIS — B965 Pseudomonas (aeruginosa) (mallei) (pseudomallei) as the cause of diseases classified elsewhere: Secondary | ICD-10-CM | POA: Diagnosis not present

## 2016-11-22 DIAGNOSIS — B377 Candidal sepsis: Secondary | ICD-10-CM | POA: Diagnosis not present

## 2016-11-22 DIAGNOSIS — D631 Anemia in chronic kidney disease: Secondary | ICD-10-CM | POA: Diagnosis not present

## 2016-11-22 DIAGNOSIS — N189 Chronic kidney disease, unspecified: Secondary | ICD-10-CM | POA: Diagnosis not present

## 2016-11-22 DIAGNOSIS — R1310 Dysphagia, unspecified: Secondary | ICD-10-CM | POA: Diagnosis not present

## 2016-11-22 DIAGNOSIS — J189 Pneumonia, unspecified organism: Secondary | ICD-10-CM | POA: Diagnosis not present

## 2016-11-22 DIAGNOSIS — N390 Urinary tract infection, site not specified: Secondary | ICD-10-CM | POA: Diagnosis not present

## 2016-11-22 DIAGNOSIS — M6281 Muscle weakness (generalized): Secondary | ICD-10-CM | POA: Diagnosis not present

## 2016-11-22 DIAGNOSIS — E440 Moderate protein-calorie malnutrition: Secondary | ICD-10-CM | POA: Diagnosis not present

## 2016-11-22 DIAGNOSIS — A419 Sepsis, unspecified organism: Secondary | ICD-10-CM | POA: Diagnosis not present

## 2016-11-26 DIAGNOSIS — E440 Moderate protein-calorie malnutrition: Secondary | ICD-10-CM | POA: Diagnosis not present

## 2016-11-26 DIAGNOSIS — N390 Urinary tract infection, site not specified: Secondary | ICD-10-CM | POA: Diagnosis not present

## 2016-11-26 DIAGNOSIS — R5381 Other malaise: Secondary | ICD-10-CM | POA: Diagnosis not present

## 2016-11-29 DIAGNOSIS — B965 Pseudomonas (aeruginosa) (mallei) (pseudomallei) as the cause of diseases classified elsewhere: Secondary | ICD-10-CM | POA: Diagnosis not present

## 2016-11-29 DIAGNOSIS — M6281 Muscle weakness (generalized): Secondary | ICD-10-CM | POA: Diagnosis not present

## 2016-11-29 DIAGNOSIS — D631 Anemia in chronic kidney disease: Secondary | ICD-10-CM | POA: Diagnosis not present

## 2016-11-29 DIAGNOSIS — R1310 Dysphagia, unspecified: Secondary | ICD-10-CM | POA: Diagnosis not present

## 2016-11-29 DIAGNOSIS — N390 Urinary tract infection, site not specified: Secondary | ICD-10-CM | POA: Diagnosis not present

## 2016-11-29 DIAGNOSIS — J189 Pneumonia, unspecified organism: Secondary | ICD-10-CM | POA: Diagnosis not present

## 2016-11-29 DIAGNOSIS — A419 Sepsis, unspecified organism: Secondary | ICD-10-CM | POA: Diagnosis not present

## 2016-11-29 DIAGNOSIS — B377 Candidal sepsis: Secondary | ICD-10-CM | POA: Diagnosis not present

## 2016-11-29 DIAGNOSIS — E440 Moderate protein-calorie malnutrition: Secondary | ICD-10-CM | POA: Diagnosis not present

## 2016-11-29 DIAGNOSIS — N189 Chronic kidney disease, unspecified: Secondary | ICD-10-CM | POA: Diagnosis not present

## 2016-12-03 DIAGNOSIS — N390 Urinary tract infection, site not specified: Secondary | ICD-10-CM | POA: Diagnosis not present

## 2016-12-03 DIAGNOSIS — N189 Chronic kidney disease, unspecified: Secondary | ICD-10-CM | POA: Diagnosis not present

## 2016-12-03 DIAGNOSIS — M6281 Muscle weakness (generalized): Secondary | ICD-10-CM | POA: Diagnosis not present

## 2016-12-03 DIAGNOSIS — B377 Candidal sepsis: Secondary | ICD-10-CM | POA: Diagnosis not present

## 2016-12-03 DIAGNOSIS — R1310 Dysphagia, unspecified: Secondary | ICD-10-CM | POA: Diagnosis not present

## 2016-12-03 DIAGNOSIS — J189 Pneumonia, unspecified organism: Secondary | ICD-10-CM | POA: Diagnosis not present

## 2016-12-03 DIAGNOSIS — B965 Pseudomonas (aeruginosa) (mallei) (pseudomallei) as the cause of diseases classified elsewhere: Secondary | ICD-10-CM | POA: Diagnosis not present

## 2016-12-03 DIAGNOSIS — D631 Anemia in chronic kidney disease: Secondary | ICD-10-CM | POA: Diagnosis not present

## 2016-12-03 DIAGNOSIS — E440 Moderate protein-calorie malnutrition: Secondary | ICD-10-CM | POA: Diagnosis not present

## 2016-12-03 DIAGNOSIS — A419 Sepsis, unspecified organism: Secondary | ICD-10-CM | POA: Diagnosis not present

## 2016-12-06 DIAGNOSIS — N189 Chronic kidney disease, unspecified: Secondary | ICD-10-CM | POA: Diagnosis not present

## 2016-12-06 DIAGNOSIS — D631 Anemia in chronic kidney disease: Secondary | ICD-10-CM | POA: Diagnosis not present

## 2016-12-06 DIAGNOSIS — R1310 Dysphagia, unspecified: Secondary | ICD-10-CM | POA: Diagnosis not present

## 2016-12-06 DIAGNOSIS — M6281 Muscle weakness (generalized): Secondary | ICD-10-CM | POA: Diagnosis not present

## 2016-12-06 DIAGNOSIS — E440 Moderate protein-calorie malnutrition: Secondary | ICD-10-CM | POA: Diagnosis not present

## 2016-12-06 DIAGNOSIS — B377 Candidal sepsis: Secondary | ICD-10-CM | POA: Diagnosis not present

## 2016-12-06 DIAGNOSIS — J189 Pneumonia, unspecified organism: Secondary | ICD-10-CM | POA: Diagnosis not present

## 2016-12-06 DIAGNOSIS — A419 Sepsis, unspecified organism: Secondary | ICD-10-CM | POA: Diagnosis not present

## 2016-12-06 DIAGNOSIS — B965 Pseudomonas (aeruginosa) (mallei) (pseudomallei) as the cause of diseases classified elsewhere: Secondary | ICD-10-CM | POA: Diagnosis not present

## 2016-12-06 DIAGNOSIS — N390 Urinary tract infection, site not specified: Secondary | ICD-10-CM | POA: Diagnosis not present

## 2016-12-08 DIAGNOSIS — H353221 Exudative age-related macular degeneration, left eye, with active choroidal neovascularization: Secondary | ICD-10-CM | POA: Diagnosis not present

## 2016-12-08 DIAGNOSIS — H353211 Exudative age-related macular degeneration, right eye, with active choroidal neovascularization: Secondary | ICD-10-CM | POA: Diagnosis not present

## 2016-12-09 DIAGNOSIS — N390 Urinary tract infection, site not specified: Secondary | ICD-10-CM | POA: Diagnosis not present

## 2016-12-09 DIAGNOSIS — B965 Pseudomonas (aeruginosa) (mallei) (pseudomallei) as the cause of diseases classified elsewhere: Secondary | ICD-10-CM | POA: Diagnosis not present

## 2016-12-09 DIAGNOSIS — J189 Pneumonia, unspecified organism: Secondary | ICD-10-CM | POA: Diagnosis not present

## 2016-12-09 DIAGNOSIS — R1310 Dysphagia, unspecified: Secondary | ICD-10-CM | POA: Diagnosis not present

## 2016-12-09 DIAGNOSIS — E440 Moderate protein-calorie malnutrition: Secondary | ICD-10-CM | POA: Diagnosis not present

## 2016-12-09 DIAGNOSIS — M6281 Muscle weakness (generalized): Secondary | ICD-10-CM | POA: Diagnosis not present

## 2016-12-09 DIAGNOSIS — B377 Candidal sepsis: Secondary | ICD-10-CM | POA: Diagnosis not present

## 2016-12-09 DIAGNOSIS — D631 Anemia in chronic kidney disease: Secondary | ICD-10-CM | POA: Diagnosis not present

## 2016-12-09 DIAGNOSIS — A419 Sepsis, unspecified organism: Secondary | ICD-10-CM | POA: Diagnosis not present

## 2016-12-09 DIAGNOSIS — N189 Chronic kidney disease, unspecified: Secondary | ICD-10-CM | POA: Diagnosis not present

## 2016-12-13 DIAGNOSIS — N200 Calculus of kidney: Secondary | ICD-10-CM | POA: Diagnosis not present

## 2016-12-14 DIAGNOSIS — R1310 Dysphagia, unspecified: Secondary | ICD-10-CM | POA: Diagnosis not present

## 2016-12-14 DIAGNOSIS — J189 Pneumonia, unspecified organism: Secondary | ICD-10-CM | POA: Diagnosis not present

## 2016-12-14 DIAGNOSIS — N189 Chronic kidney disease, unspecified: Secondary | ICD-10-CM | POA: Diagnosis not present

## 2016-12-14 DIAGNOSIS — B377 Candidal sepsis: Secondary | ICD-10-CM | POA: Diagnosis not present

## 2016-12-14 DIAGNOSIS — A419 Sepsis, unspecified organism: Secondary | ICD-10-CM | POA: Diagnosis not present

## 2016-12-14 DIAGNOSIS — E440 Moderate protein-calorie malnutrition: Secondary | ICD-10-CM | POA: Diagnosis not present

## 2016-12-14 DIAGNOSIS — M6281 Muscle weakness (generalized): Secondary | ICD-10-CM | POA: Diagnosis not present

## 2016-12-14 DIAGNOSIS — D631 Anemia in chronic kidney disease: Secondary | ICD-10-CM | POA: Diagnosis not present

## 2016-12-14 DIAGNOSIS — N390 Urinary tract infection, site not specified: Secondary | ICD-10-CM | POA: Diagnosis not present

## 2016-12-14 DIAGNOSIS — B965 Pseudomonas (aeruginosa) (mallei) (pseudomallei) as the cause of diseases classified elsewhere: Secondary | ICD-10-CM | POA: Diagnosis not present

## 2016-12-16 ENCOUNTER — Other Ambulatory Visit: Payer: Self-pay | Admitting: Internal Medicine

## 2016-12-16 ENCOUNTER — Ambulatory Visit
Admission: RE | Admit: 2016-12-16 | Discharge: 2016-12-16 | Disposition: A | Discharge status: Home / Self Care | Payer: PPO | Source: Ambulatory Visit | Attending: Internal Medicine | Admitting: Internal Medicine

## 2016-12-16 DIAGNOSIS — K449 Diaphragmatic hernia without obstruction or gangrene: Secondary | ICD-10-CM | POA: Diagnosis not present

## 2016-12-16 DIAGNOSIS — Z1389 Encounter for screening for other disorder: Secondary | ICD-10-CM | POA: Diagnosis not present

## 2016-12-16 DIAGNOSIS — Z0000 Encounter for general adult medical examination without abnormal findings: Secondary | ICD-10-CM | POA: Diagnosis not present

## 2016-12-16 DIAGNOSIS — R0609 Other forms of dyspnea: Secondary | ICD-10-CM | POA: Diagnosis not present

## 2016-12-16 DIAGNOSIS — R5381 Other malaise: Secondary | ICD-10-CM | POA: Diagnosis not present

## 2016-12-16 DIAGNOSIS — E440 Moderate protein-calorie malnutrition: Secondary | ICD-10-CM | POA: Diagnosis not present

## 2016-12-16 DIAGNOSIS — Z111 Encounter for screening for respiratory tuberculosis: Secondary | ICD-10-CM | POA: Diagnosis not present

## 2016-12-29 DIAGNOSIS — Z79899 Other long term (current) drug therapy: Secondary | ICD-10-CM | POA: Diagnosis not present

## 2016-12-30 DIAGNOSIS — F509 Eating disorder, unspecified: Secondary | ICD-10-CM | POA: Diagnosis not present

## 2016-12-30 DIAGNOSIS — H409 Unspecified glaucoma: Secondary | ICD-10-CM | POA: Diagnosis not present

## 2016-12-30 DIAGNOSIS — F0390 Unspecified dementia without behavioral disturbance: Secondary | ICD-10-CM | POA: Diagnosis not present

## 2016-12-30 DIAGNOSIS — I10 Essential (primary) hypertension: Secondary | ICD-10-CM | POA: Diagnosis not present

## 2017-01-13 DIAGNOSIS — F0390 Unspecified dementia without behavioral disturbance: Secondary | ICD-10-CM | POA: Diagnosis not present

## 2017-01-13 DIAGNOSIS — H3530 Unspecified macular degeneration: Secondary | ICD-10-CM | POA: Diagnosis not present

## 2017-01-13 DIAGNOSIS — F509 Eating disorder, unspecified: Secondary | ICD-10-CM | POA: Diagnosis not present

## 2017-01-13 DIAGNOSIS — A084 Viral intestinal infection, unspecified: Secondary | ICD-10-CM | POA: Diagnosis not present

## 2017-01-13 DIAGNOSIS — I10 Essential (primary) hypertension: Secondary | ICD-10-CM | POA: Diagnosis not present

## 2017-02-03 DIAGNOSIS — L650 Telogen effluvium: Secondary | ICD-10-CM | POA: Diagnosis not present

## 2017-02-03 DIAGNOSIS — X32XXXA Exposure to sunlight, initial encounter: Secondary | ICD-10-CM | POA: Diagnosis not present

## 2017-02-03 DIAGNOSIS — L570 Actinic keratosis: Secondary | ICD-10-CM | POA: Diagnosis not present

## 2017-02-03 DIAGNOSIS — F0390 Unspecified dementia without behavioral disturbance: Secondary | ICD-10-CM | POA: Diagnosis not present

## 2017-02-03 DIAGNOSIS — I10 Essential (primary) hypertension: Secondary | ICD-10-CM | POA: Diagnosis not present

## 2017-02-03 DIAGNOSIS — M6281 Muscle weakness (generalized): Secondary | ICD-10-CM | POA: Diagnosis not present

## 2017-02-08 DIAGNOSIS — M810 Age-related osteoporosis without current pathological fracture: Secondary | ICD-10-CM | POA: Diagnosis not present

## 2017-02-08 DIAGNOSIS — M47816 Spondylosis without myelopathy or radiculopathy, lumbar region: Secondary | ICD-10-CM | POA: Diagnosis not present

## 2017-02-08 DIAGNOSIS — Z9181 History of falling: Secondary | ICD-10-CM | POA: Diagnosis not present

## 2017-02-08 DIAGNOSIS — E46 Unspecified protein-calorie malnutrition: Secondary | ICD-10-CM | POA: Diagnosis not present

## 2017-02-08 DIAGNOSIS — M6281 Muscle weakness (generalized): Secondary | ICD-10-CM | POA: Diagnosis not present

## 2017-02-08 DIAGNOSIS — H3530 Unspecified macular degeneration: Secondary | ICD-10-CM | POA: Diagnosis not present

## 2017-02-08 DIAGNOSIS — Z7982 Long term (current) use of aspirin: Secondary | ICD-10-CM | POA: Diagnosis not present

## 2017-02-08 DIAGNOSIS — Z87440 Personal history of urinary (tract) infections: Secondary | ICD-10-CM | POA: Diagnosis not present

## 2017-02-08 DIAGNOSIS — N200 Calculus of kidney: Secondary | ICD-10-CM | POA: Diagnosis not present

## 2017-02-08 DIAGNOSIS — M1611 Unilateral primary osteoarthritis, right hip: Secondary | ICD-10-CM | POA: Diagnosis not present

## 2017-02-10 DIAGNOSIS — N3020 Other chronic cystitis without hematuria: Secondary | ICD-10-CM | POA: Diagnosis not present

## 2017-02-10 DIAGNOSIS — F0390 Unspecified dementia without behavioral disturbance: Secondary | ICD-10-CM | POA: Diagnosis not present

## 2017-02-10 DIAGNOSIS — I10 Essential (primary) hypertension: Secondary | ICD-10-CM | POA: Diagnosis not present

## 2017-02-10 DIAGNOSIS — L299 Pruritus, unspecified: Secondary | ICD-10-CM | POA: Diagnosis not present

## 2017-02-14 DIAGNOSIS — M1611 Unilateral primary osteoarthritis, right hip: Secondary | ICD-10-CM | POA: Diagnosis not present

## 2017-02-14 DIAGNOSIS — N390 Urinary tract infection, site not specified: Secondary | ICD-10-CM | POA: Diagnosis not present

## 2017-02-14 DIAGNOSIS — N200 Calculus of kidney: Secondary | ICD-10-CM | POA: Diagnosis not present

## 2017-02-14 DIAGNOSIS — Z7982 Long term (current) use of aspirin: Secondary | ICD-10-CM | POA: Diagnosis not present

## 2017-02-14 DIAGNOSIS — M6281 Muscle weakness (generalized): Secondary | ICD-10-CM | POA: Diagnosis not present

## 2017-02-14 DIAGNOSIS — E46 Unspecified protein-calorie malnutrition: Secondary | ICD-10-CM | POA: Diagnosis not present

## 2017-02-14 DIAGNOSIS — M810 Age-related osteoporosis without current pathological fracture: Secondary | ICD-10-CM | POA: Diagnosis not present

## 2017-02-14 DIAGNOSIS — Z87440 Personal history of urinary (tract) infections: Secondary | ICD-10-CM | POA: Diagnosis not present

## 2017-02-14 DIAGNOSIS — H3530 Unspecified macular degeneration: Secondary | ICD-10-CM | POA: Diagnosis not present

## 2017-02-14 DIAGNOSIS — M47816 Spondylosis without myelopathy or radiculopathy, lumbar region: Secondary | ICD-10-CM | POA: Diagnosis not present

## 2017-02-14 DIAGNOSIS — Z9181 History of falling: Secondary | ICD-10-CM | POA: Diagnosis not present

## 2017-02-17 DIAGNOSIS — I10 Essential (primary) hypertension: Secondary | ICD-10-CM | POA: Diagnosis not present

## 2017-02-17 DIAGNOSIS — F0390 Unspecified dementia without behavioral disturbance: Secondary | ICD-10-CM | POA: Diagnosis not present

## 2017-02-17 DIAGNOSIS — I252 Old myocardial infarction: Secondary | ICD-10-CM | POA: Diagnosis not present

## 2017-02-17 DIAGNOSIS — N390 Urinary tract infection, site not specified: Secondary | ICD-10-CM | POA: Diagnosis not present

## 2017-02-22 DIAGNOSIS — N200 Calculus of kidney: Secondary | ICD-10-CM | POA: Diagnosis not present

## 2017-02-22 DIAGNOSIS — Z7982 Long term (current) use of aspirin: Secondary | ICD-10-CM | POA: Diagnosis not present

## 2017-02-22 DIAGNOSIS — M810 Age-related osteoporosis without current pathological fracture: Secondary | ICD-10-CM | POA: Diagnosis not present

## 2017-02-22 DIAGNOSIS — H3530 Unspecified macular degeneration: Secondary | ICD-10-CM | POA: Diagnosis not present

## 2017-02-22 DIAGNOSIS — M1611 Unilateral primary osteoarthritis, right hip: Secondary | ICD-10-CM | POA: Diagnosis not present

## 2017-02-22 DIAGNOSIS — E46 Unspecified protein-calorie malnutrition: Secondary | ICD-10-CM | POA: Diagnosis not present

## 2017-02-22 DIAGNOSIS — M6281 Muscle weakness (generalized): Secondary | ICD-10-CM | POA: Diagnosis not present

## 2017-02-22 DIAGNOSIS — Z9181 History of falling: Secondary | ICD-10-CM | POA: Diagnosis not present

## 2017-02-22 DIAGNOSIS — Z87440 Personal history of urinary (tract) infections: Secondary | ICD-10-CM | POA: Diagnosis not present

## 2017-02-22 DIAGNOSIS — M47816 Spondylosis without myelopathy or radiculopathy, lumbar region: Secondary | ICD-10-CM | POA: Diagnosis not present

## 2017-02-28 DIAGNOSIS — M6281 Muscle weakness (generalized): Secondary | ICD-10-CM | POA: Diagnosis not present

## 2017-02-28 DIAGNOSIS — H3530 Unspecified macular degeneration: Secondary | ICD-10-CM | POA: Diagnosis not present

## 2017-02-28 DIAGNOSIS — N200 Calculus of kidney: Secondary | ICD-10-CM | POA: Diagnosis not present

## 2017-02-28 DIAGNOSIS — M810 Age-related osteoporosis without current pathological fracture: Secondary | ICD-10-CM | POA: Diagnosis not present

## 2017-02-28 DIAGNOSIS — M1611 Unilateral primary osteoarthritis, right hip: Secondary | ICD-10-CM | POA: Diagnosis not present

## 2017-02-28 DIAGNOSIS — Z9181 History of falling: Secondary | ICD-10-CM | POA: Diagnosis not present

## 2017-02-28 DIAGNOSIS — M47816 Spondylosis without myelopathy or radiculopathy, lumbar region: Secondary | ICD-10-CM | POA: Diagnosis not present

## 2017-02-28 DIAGNOSIS — Z7982 Long term (current) use of aspirin: Secondary | ICD-10-CM | POA: Diagnosis not present

## 2017-02-28 DIAGNOSIS — E46 Unspecified protein-calorie malnutrition: Secondary | ICD-10-CM | POA: Diagnosis not present

## 2017-02-28 DIAGNOSIS — Z87440 Personal history of urinary (tract) infections: Secondary | ICD-10-CM | POA: Diagnosis not present

## 2017-03-01 DIAGNOSIS — H353221 Exudative age-related macular degeneration, left eye, with active choroidal neovascularization: Secondary | ICD-10-CM | POA: Diagnosis not present

## 2017-03-01 DIAGNOSIS — H353211 Exudative age-related macular degeneration, right eye, with active choroidal neovascularization: Secondary | ICD-10-CM | POA: Diagnosis not present

## 2017-03-07 DIAGNOSIS — Z9181 History of falling: Secondary | ICD-10-CM | POA: Diagnosis not present

## 2017-03-07 DIAGNOSIS — Z7982 Long term (current) use of aspirin: Secondary | ICD-10-CM | POA: Diagnosis not present

## 2017-03-07 DIAGNOSIS — Z87440 Personal history of urinary (tract) infections: Secondary | ICD-10-CM | POA: Diagnosis not present

## 2017-03-07 DIAGNOSIS — E46 Unspecified protein-calorie malnutrition: Secondary | ICD-10-CM | POA: Diagnosis not present

## 2017-03-07 DIAGNOSIS — M6281 Muscle weakness (generalized): Secondary | ICD-10-CM | POA: Diagnosis not present

## 2017-03-07 DIAGNOSIS — M47816 Spondylosis without myelopathy or radiculopathy, lumbar region: Secondary | ICD-10-CM | POA: Diagnosis not present

## 2017-03-07 DIAGNOSIS — M810 Age-related osteoporosis without current pathological fracture: Secondary | ICD-10-CM | POA: Diagnosis not present

## 2017-03-07 DIAGNOSIS — N200 Calculus of kidney: Secondary | ICD-10-CM | POA: Diagnosis not present

## 2017-03-07 DIAGNOSIS — M1611 Unilateral primary osteoarthritis, right hip: Secondary | ICD-10-CM | POA: Diagnosis not present

## 2017-03-07 DIAGNOSIS — H3530 Unspecified macular degeneration: Secondary | ICD-10-CM | POA: Diagnosis not present

## 2017-03-10 DIAGNOSIS — H353211 Exudative age-related macular degeneration, right eye, with active choroidal neovascularization: Secondary | ICD-10-CM | POA: Diagnosis not present

## 2017-03-10 DIAGNOSIS — I10 Essential (primary) hypertension: Secondary | ICD-10-CM | POA: Diagnosis not present

## 2017-03-10 DIAGNOSIS — F0390 Unspecified dementia without behavioral disturbance: Secondary | ICD-10-CM | POA: Diagnosis not present

## 2017-03-10 DIAGNOSIS — N390 Urinary tract infection, site not specified: Secondary | ICD-10-CM | POA: Diagnosis not present

## 2017-03-11 DIAGNOSIS — N390 Urinary tract infection, site not specified: Secondary | ICD-10-CM | POA: Diagnosis not present

## 2017-03-15 DIAGNOSIS — M1611 Unilateral primary osteoarthritis, right hip: Secondary | ICD-10-CM | POA: Diagnosis not present

## 2017-03-15 DIAGNOSIS — M810 Age-related osteoporosis without current pathological fracture: Secondary | ICD-10-CM | POA: Diagnosis not present

## 2017-03-15 DIAGNOSIS — H3530 Unspecified macular degeneration: Secondary | ICD-10-CM | POA: Diagnosis not present

## 2017-03-15 DIAGNOSIS — Z87440 Personal history of urinary (tract) infections: Secondary | ICD-10-CM | POA: Diagnosis not present

## 2017-03-15 DIAGNOSIS — Z9181 History of falling: Secondary | ICD-10-CM | POA: Diagnosis not present

## 2017-03-15 DIAGNOSIS — N200 Calculus of kidney: Secondary | ICD-10-CM | POA: Diagnosis not present

## 2017-03-15 DIAGNOSIS — E46 Unspecified protein-calorie malnutrition: Secondary | ICD-10-CM | POA: Diagnosis not present

## 2017-03-15 DIAGNOSIS — M6281 Muscle weakness (generalized): Secondary | ICD-10-CM | POA: Diagnosis not present

## 2017-03-15 DIAGNOSIS — M47816 Spondylosis without myelopathy or radiculopathy, lumbar region: Secondary | ICD-10-CM | POA: Diagnosis not present

## 2017-03-15 DIAGNOSIS — Z7982 Long term (current) use of aspirin: Secondary | ICD-10-CM | POA: Diagnosis not present

## 2017-03-22 DIAGNOSIS — Z7982 Long term (current) use of aspirin: Secondary | ICD-10-CM | POA: Diagnosis not present

## 2017-03-22 DIAGNOSIS — N200 Calculus of kidney: Secondary | ICD-10-CM | POA: Diagnosis not present

## 2017-03-22 DIAGNOSIS — M47816 Spondylosis without myelopathy or radiculopathy, lumbar region: Secondary | ICD-10-CM | POA: Diagnosis not present

## 2017-03-22 DIAGNOSIS — M810 Age-related osteoporosis without current pathological fracture: Secondary | ICD-10-CM | POA: Diagnosis not present

## 2017-03-22 DIAGNOSIS — M1611 Unilateral primary osteoarthritis, right hip: Secondary | ICD-10-CM | POA: Diagnosis not present

## 2017-03-22 DIAGNOSIS — M6281 Muscle weakness (generalized): Secondary | ICD-10-CM | POA: Diagnosis not present

## 2017-03-22 DIAGNOSIS — Z9181 History of falling: Secondary | ICD-10-CM | POA: Diagnosis not present

## 2017-03-22 DIAGNOSIS — H3530 Unspecified macular degeneration: Secondary | ICD-10-CM | POA: Diagnosis not present

## 2017-03-22 DIAGNOSIS — E46 Unspecified protein-calorie malnutrition: Secondary | ICD-10-CM | POA: Diagnosis not present

## 2017-03-22 DIAGNOSIS — Z87440 Personal history of urinary (tract) infections: Secondary | ICD-10-CM | POA: Diagnosis not present

## 2017-05-05 DIAGNOSIS — F0390 Unspecified dementia without behavioral disturbance: Secondary | ICD-10-CM | POA: Diagnosis not present

## 2017-05-05 DIAGNOSIS — N390 Urinary tract infection, site not specified: Secondary | ICD-10-CM | POA: Diagnosis not present

## 2017-05-05 DIAGNOSIS — I10 Essential (primary) hypertension: Secondary | ICD-10-CM | POA: Diagnosis not present

## 2017-05-12 DIAGNOSIS — I872 Venous insufficiency (chronic) (peripheral): Secondary | ICD-10-CM | POA: Diagnosis not present

## 2017-05-12 DIAGNOSIS — I10 Essential (primary) hypertension: Secondary | ICD-10-CM | POA: Diagnosis not present

## 2017-05-20 DIAGNOSIS — H353221 Exudative age-related macular degeneration, left eye, with active choroidal neovascularization: Secondary | ICD-10-CM | POA: Diagnosis not present

## 2017-06-10 DIAGNOSIS — H353211 Exudative age-related macular degeneration, right eye, with active choroidal neovascularization: Secondary | ICD-10-CM | POA: Diagnosis not present

## 2018-10-13 IMAGING — DX DG CHEST 1V PORT
1 series · 1 of 1 positions shown · non-contrast
Comparison: 06/19/2015.

CLINICAL DATA: Hypoxia.

EXAM:
PORTABLE CHEST 1 VIEW

[chest ap]
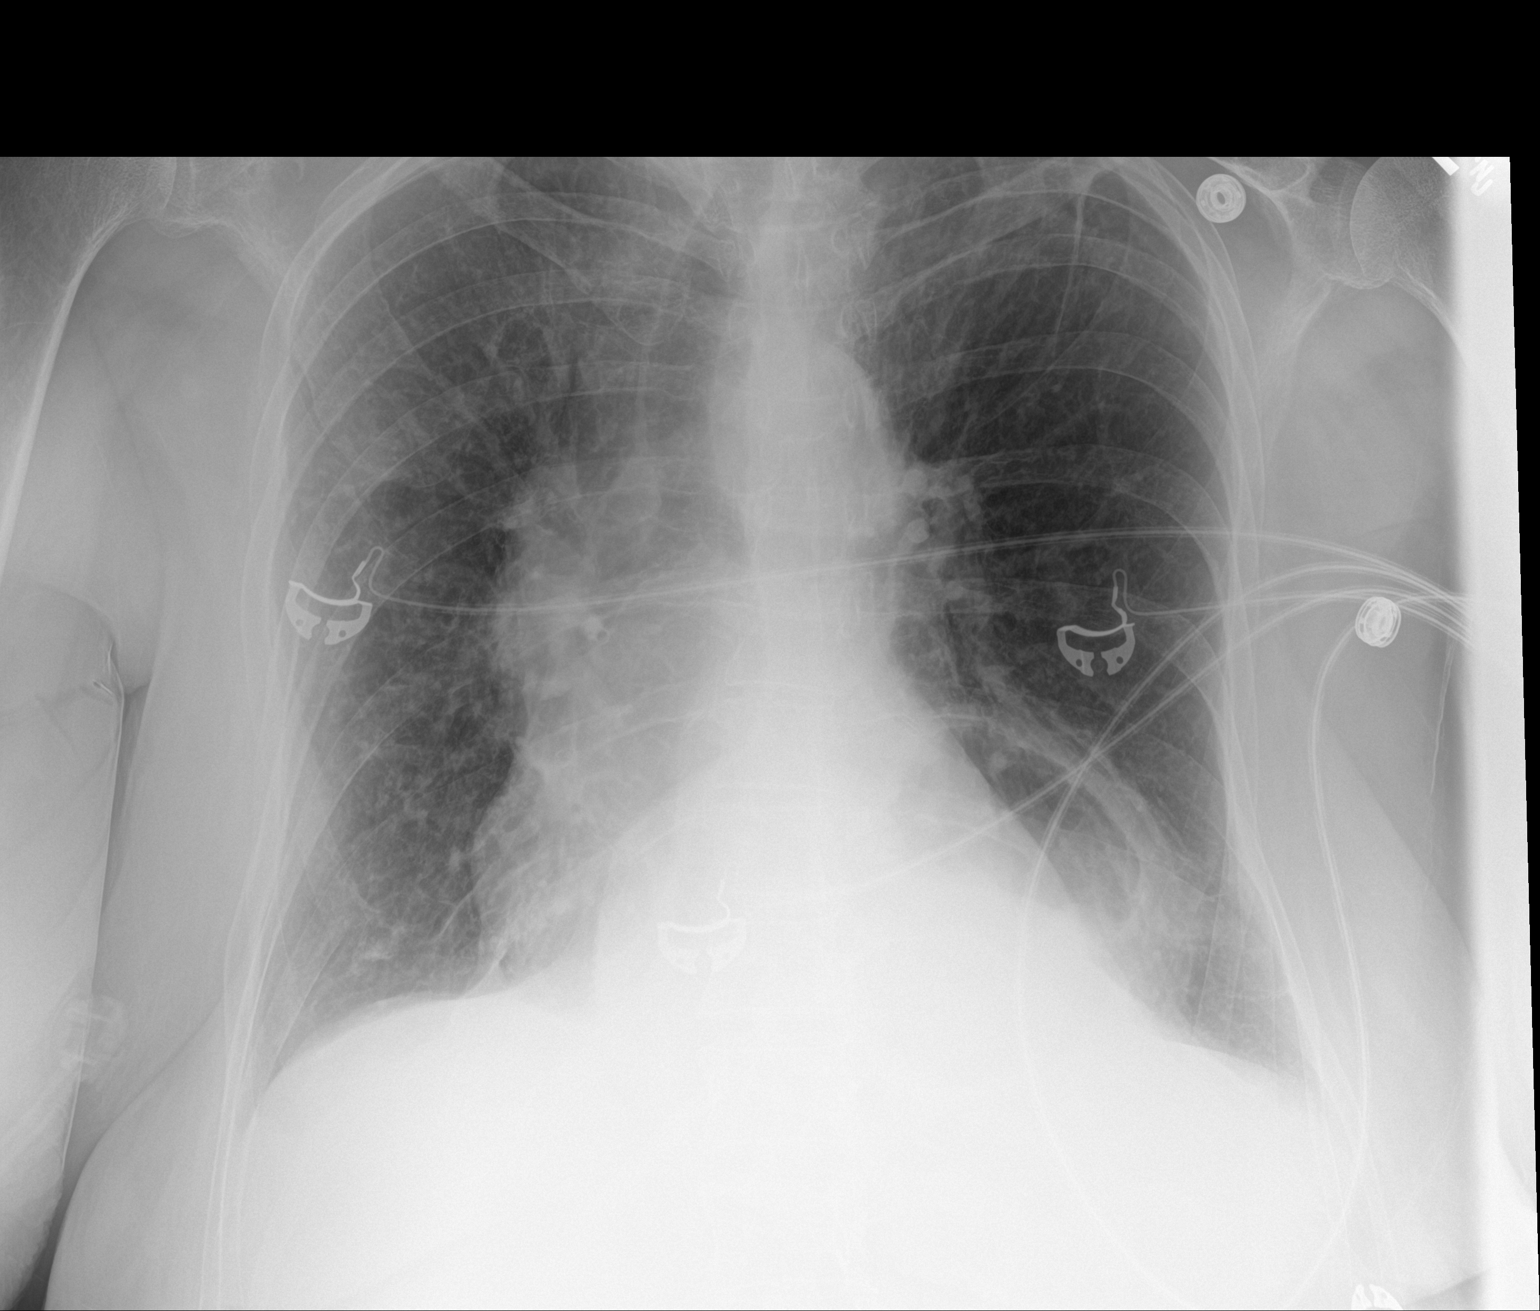

[1 of 1 positions shown; findings below may reference images not displayed]

FINDINGS: Trachea is midline. Heart size stable. Thoracic aorta is calcified.
There may be new right hilar prominence although the patient is
rotated. Linear scarring in the left upper lobe. Lungs are otherwise
clear. No pleural fluid. Large hiatal hernia.
IMPRESSION: 1. Right hilar prominence may be due to rotation. Difficult to
exclude adenopathy or mass. Consider follow-up PA and lateral views
of the chest in further initial evaluation.
2. Otherwise, no acute findings in the chest.
3. Large hiatal hernia.

## 2018-10-15 IMAGING — DX DG CHEST 2V
2 series · 2 of 2 positions shown · non-contrast
Comparison: 10/20/2016 .

CLINICAL DATA: Shortness of breath.  Fatigue.

EXAM:
CHEST  2 VIEW

[chest ap]
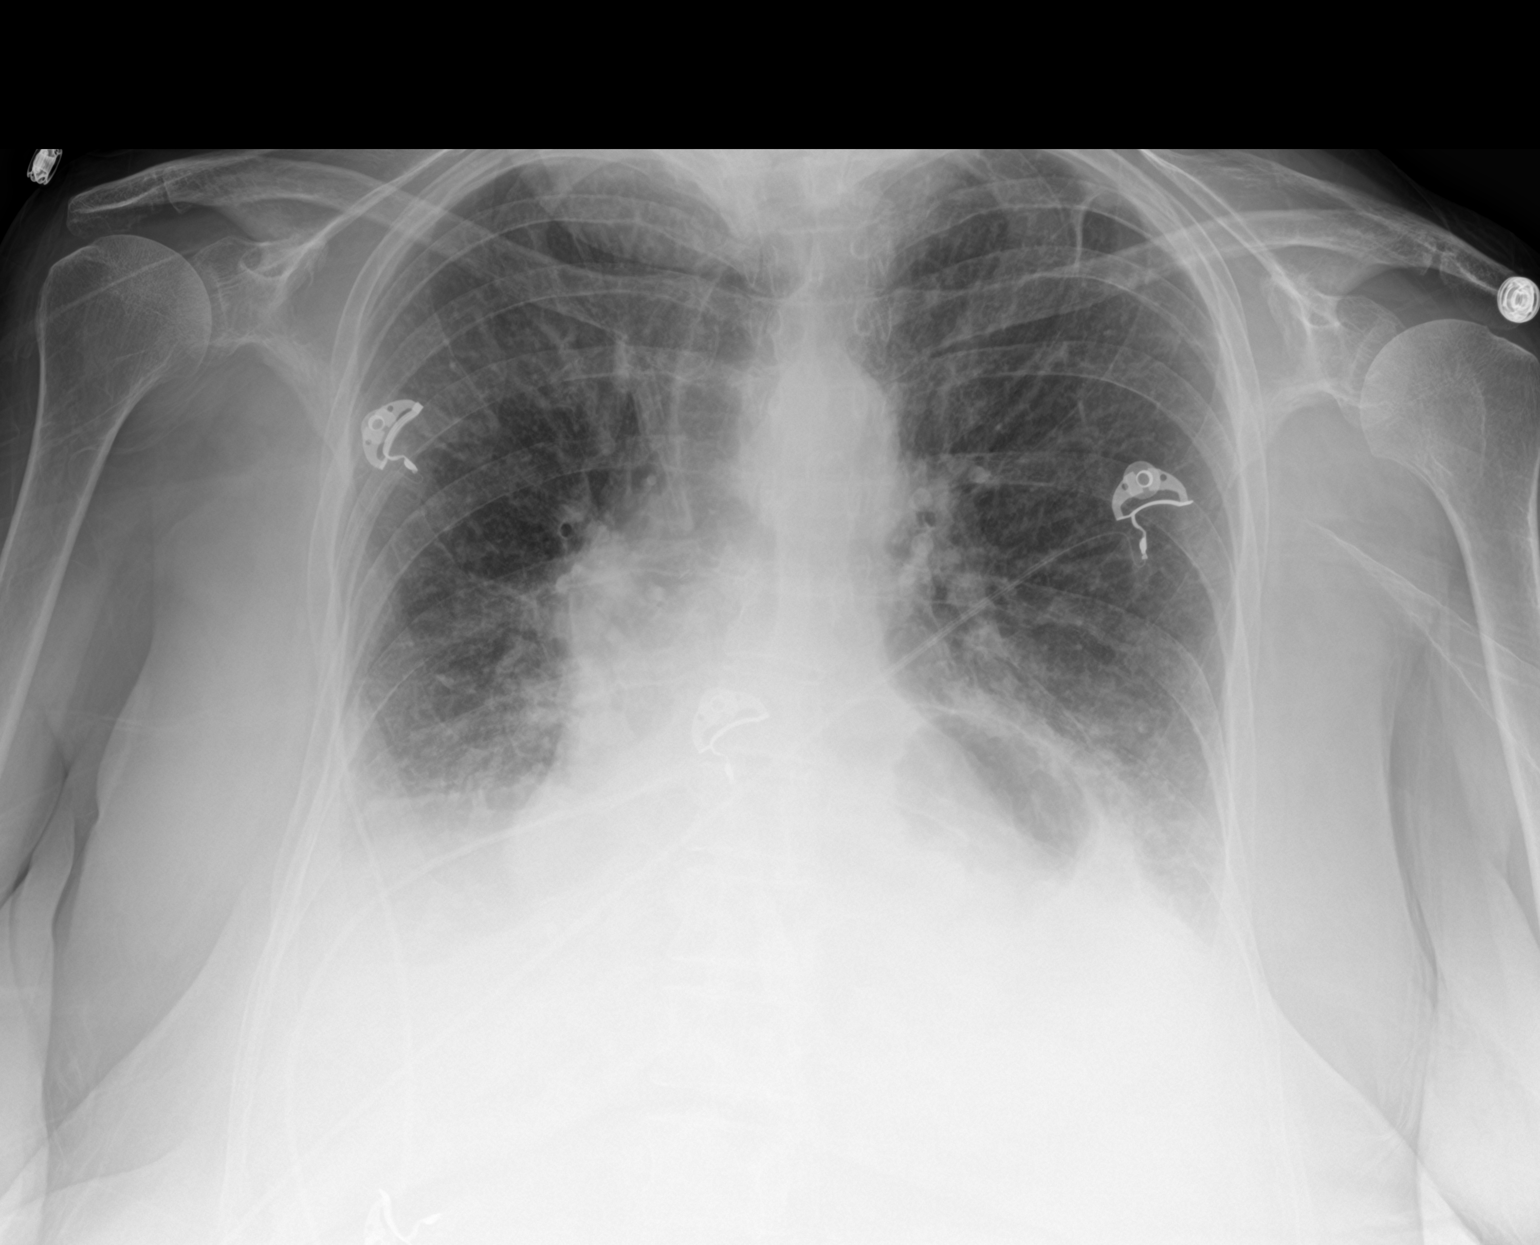

[chest lat]
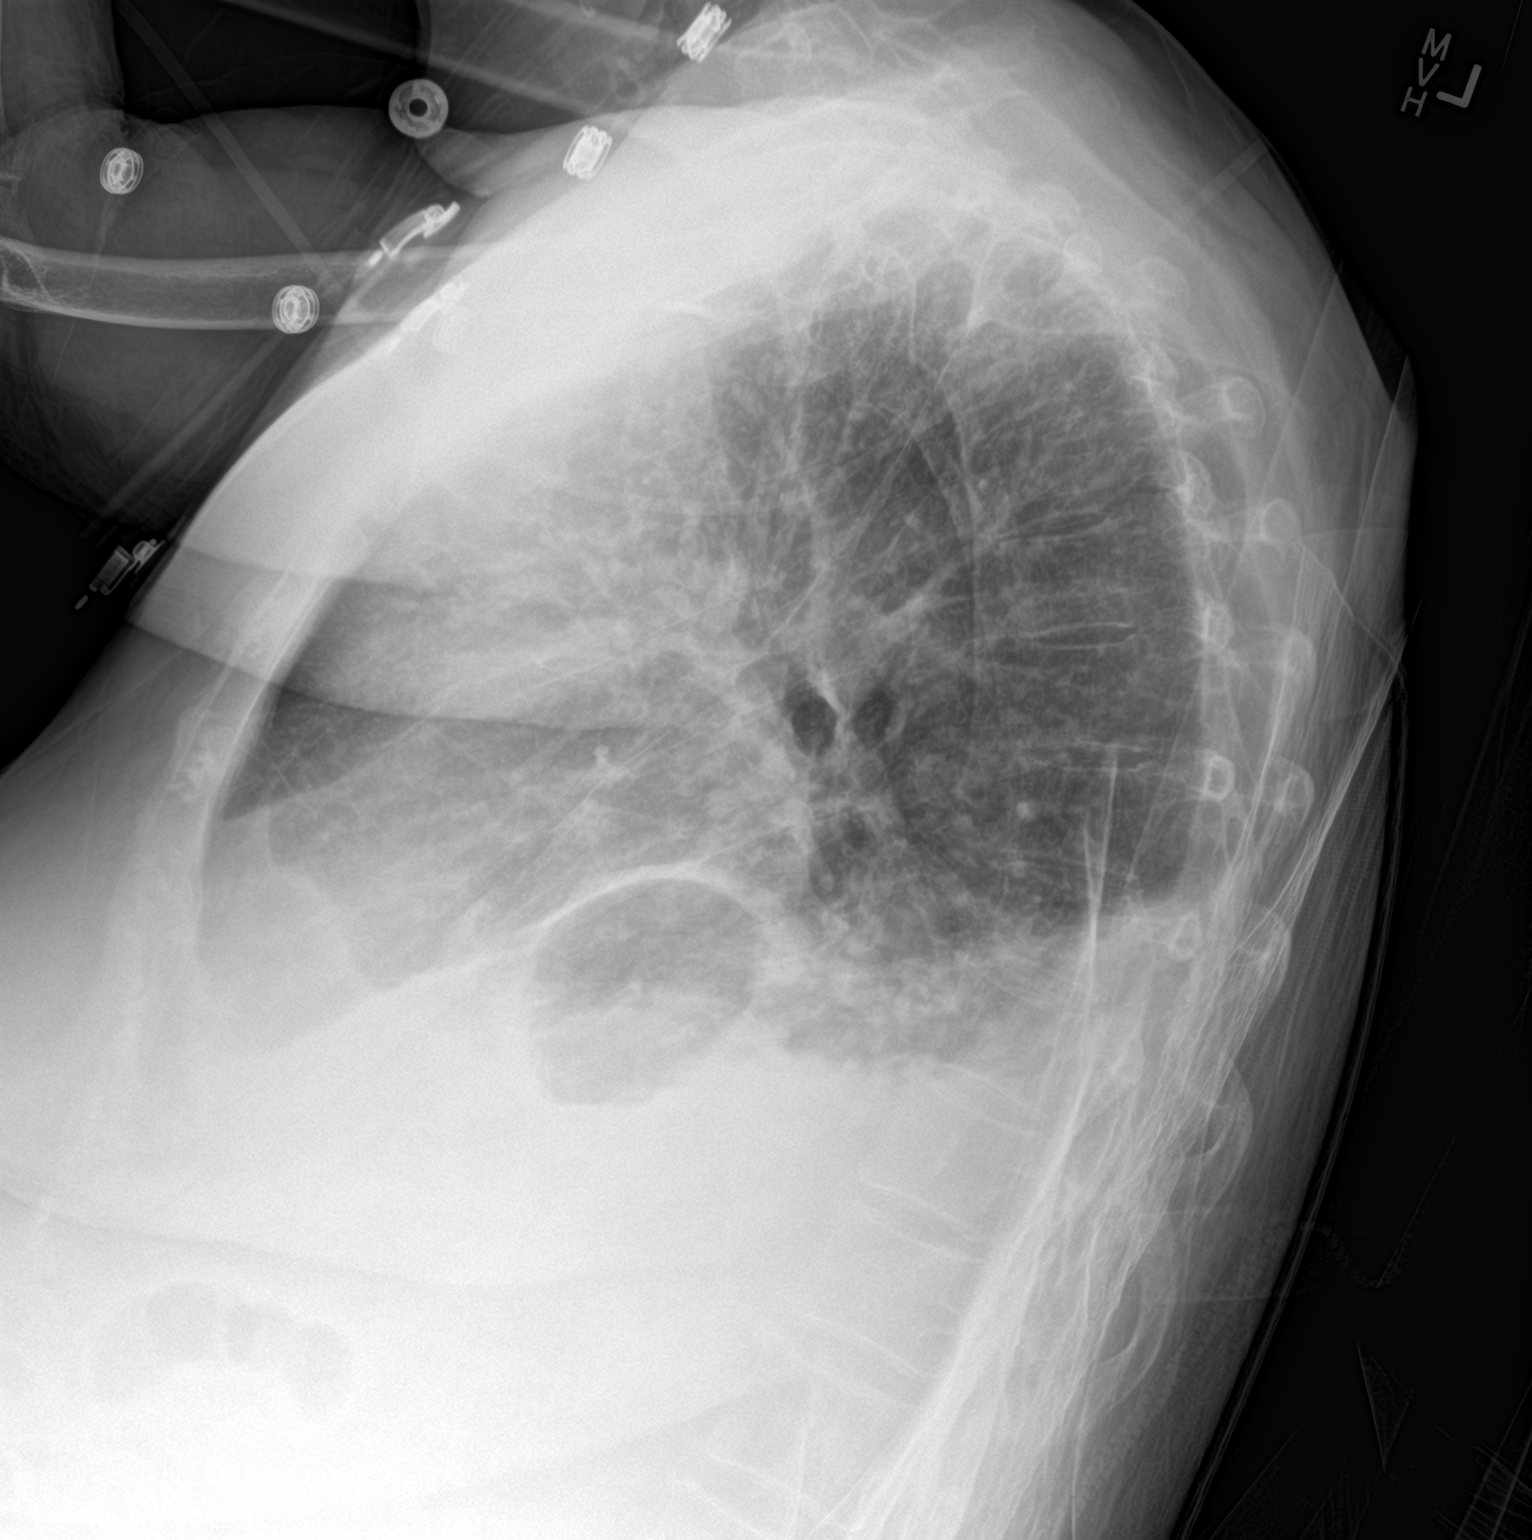

[2 of 2 positions shown; findings below may reference images not displayed]

FINDINGS: Cardiomegaly with pulmonary vascular prominence and bilateral
interstitial prominence bilateral pleural effusions. Findings
consistent CHF. Prominence sliding hiatal hernia. No pneumothorax.
IMPRESSION: 1. Congestive heart failure with bilateral pulmonary interstitial
edema and bilateral pleural effusions.

2. Sliding hiatal hernia.

## 2018-10-27 IMAGING — CR DG CHEST 2V
2 series · 2 of 2 positions shown · non-contrast
Comparison: 10/22/2016 and prior radiographs.  10/01/2016 CT

CLINICAL DATA: Altered mental status.

EXAM:
CHEST  2 VIEW

[chest lat]
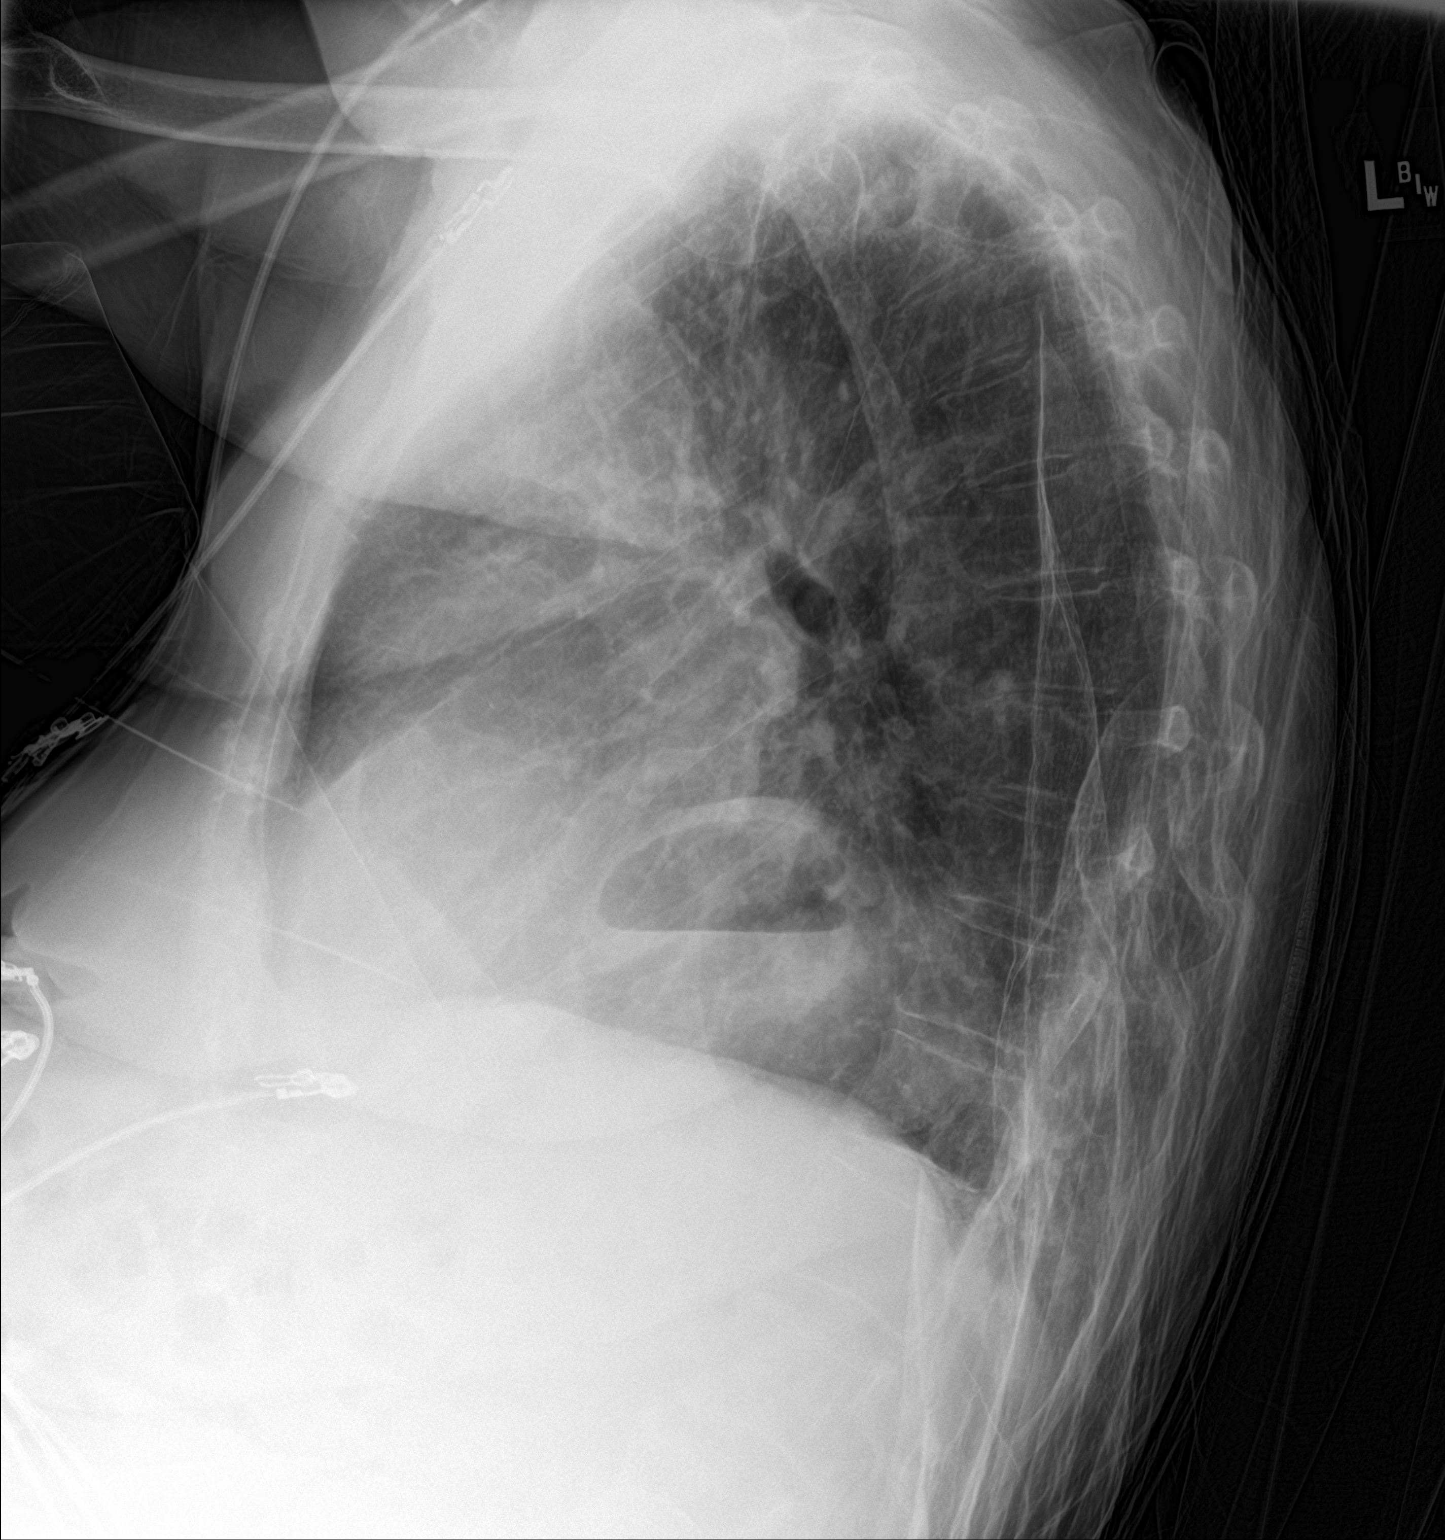

[chest ap]
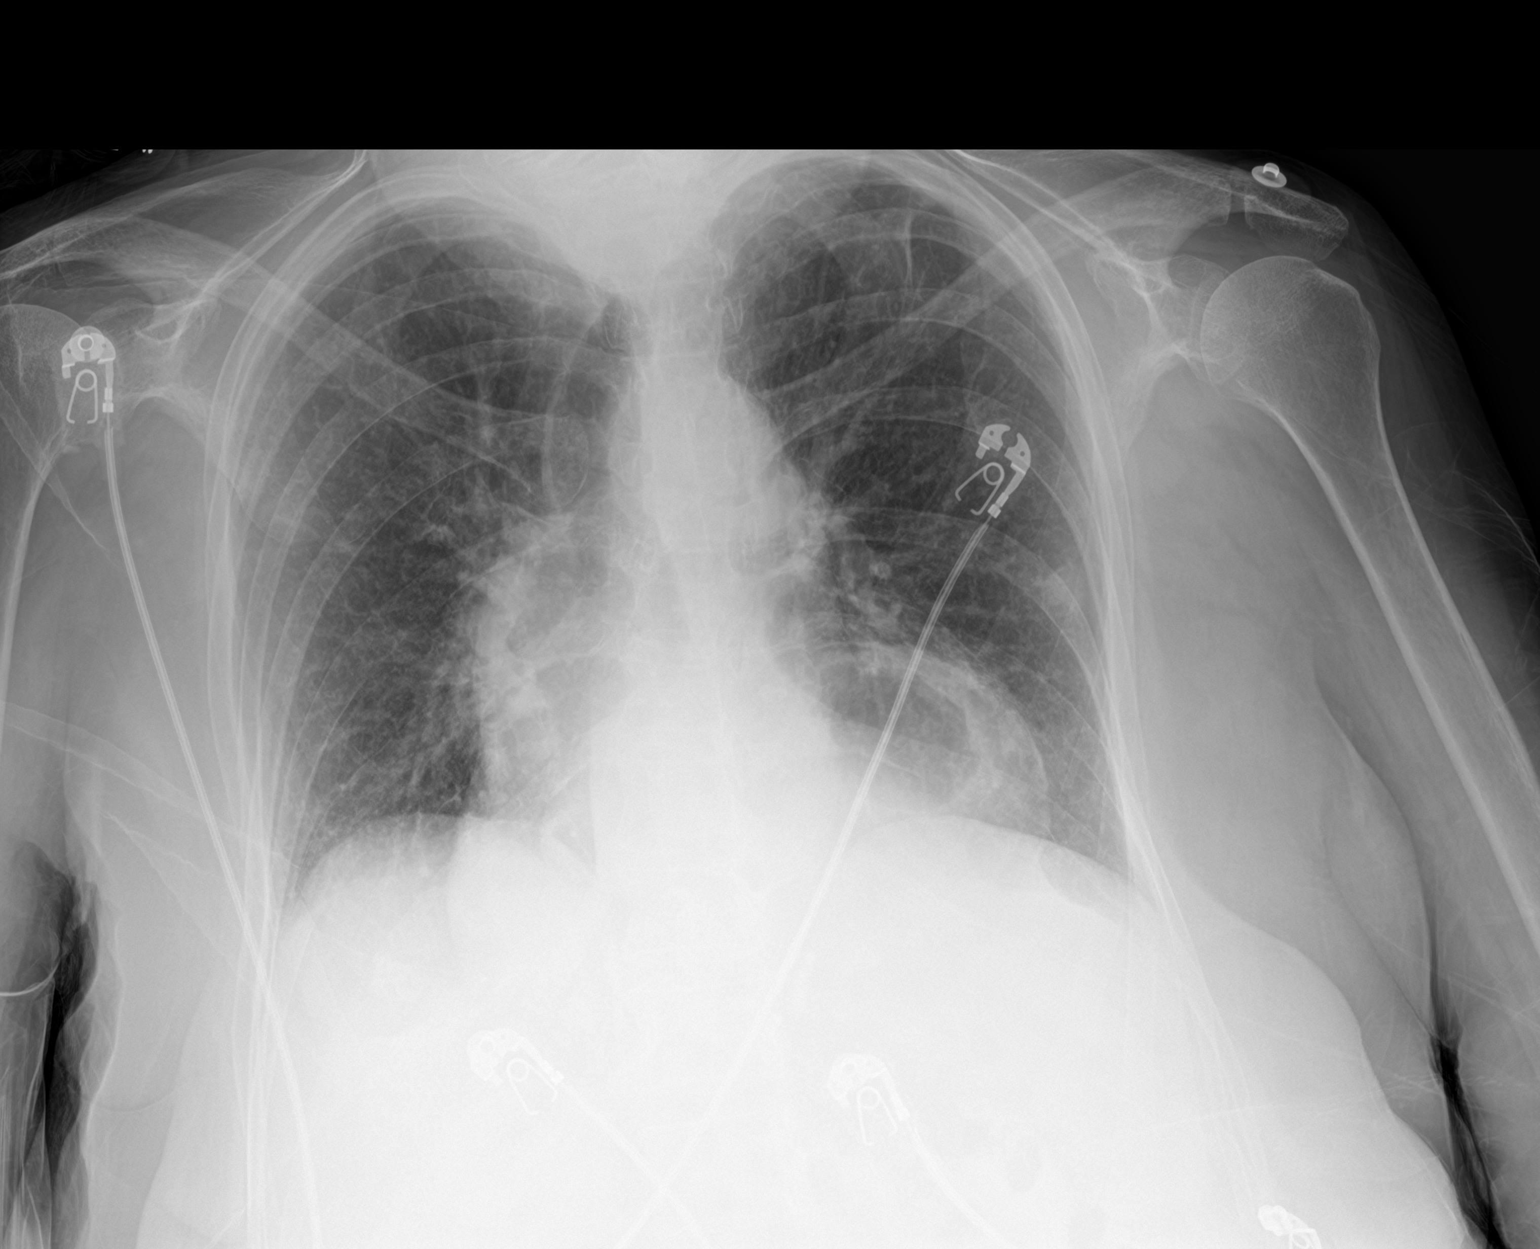

[2 of 2 positions shown; findings below may reference images not displayed]

FINDINGS: Cardiomediastinal silhouette is unchanged with moderate to large
hiatal hernia again noted.

This is a low volume film.

There is no evidence of focal airspace disease, pulmonary edema,
suspicious pulmonary nodule/mass, pleural effusion, or pneumothorax.
No acute bony abnormalities are identified.
IMPRESSION: No evidence of acute cardiopulmonary disease.

Hiatal hernia.

## 2018-10-27 IMAGING — CT CT RENAL STONE PROTOCOL
2 of 4 series · 15 of 46 positions shown, 17 images · non-contrast
Comparison: 10/01/2016.

CLINICAL DATA: Abdominal pain.  Recent sepsis.

EXAM:
CT ABDOMEN AND PELVIS WITHOUT CONTRAST
TECHNIQUE: Multidetector CT imaging of the abdomen and pelvis was performed
following the standard protocol without IV contrast.

[Series 3: renal stone 5.0 · axial · 0.90mm/px · z∈[+894,+1314]mm · 12 of 96 slices shown, 14 images]
[im 8/96  soft-tissue]
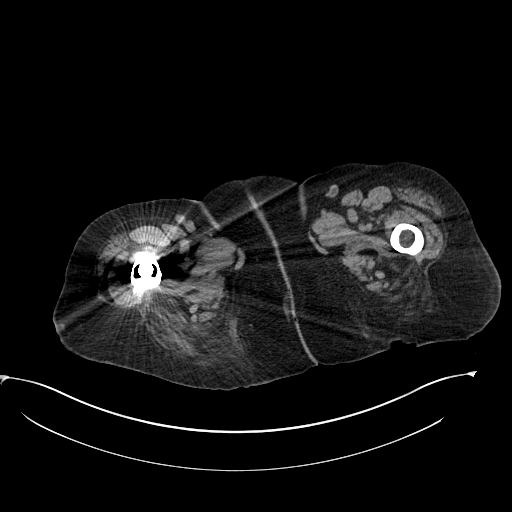
[im 8/96  bone]
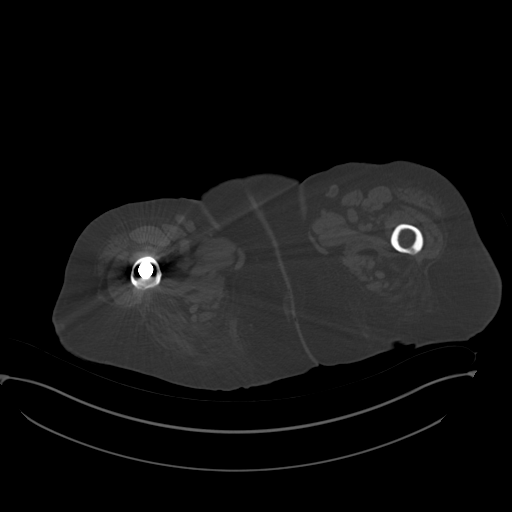
[im 16/96  soft-tissue]
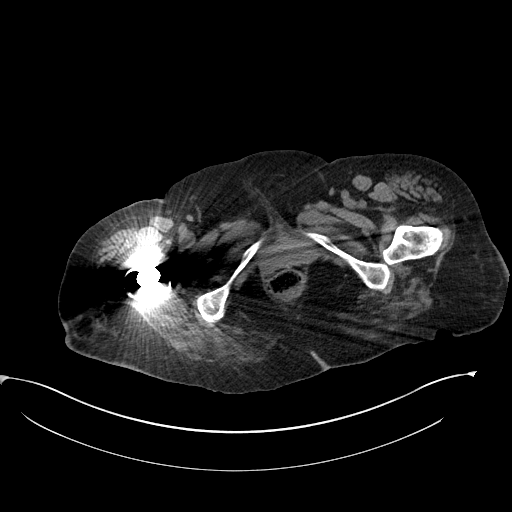
[im 23/96  soft-tissue]
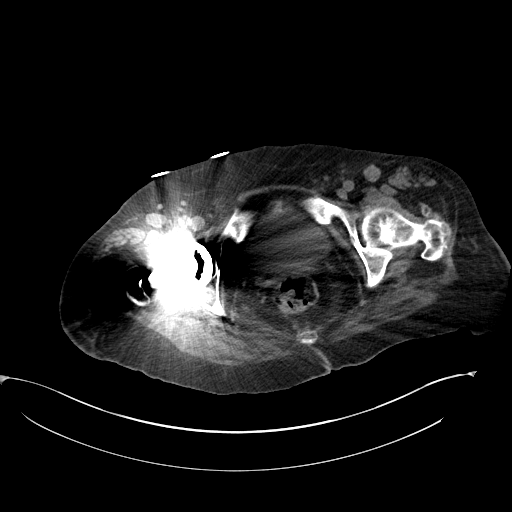
[im 31/96  soft-tissue]
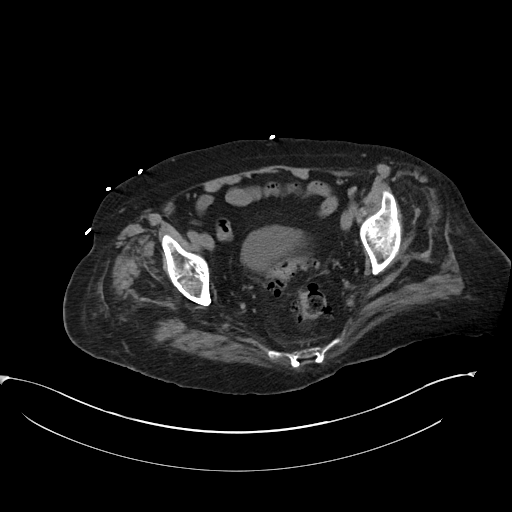
[im 39/96  soft-tissue]
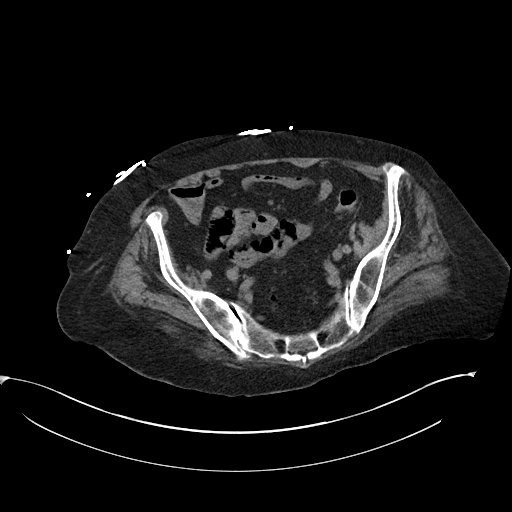
[im 46/96  soft-tissue]
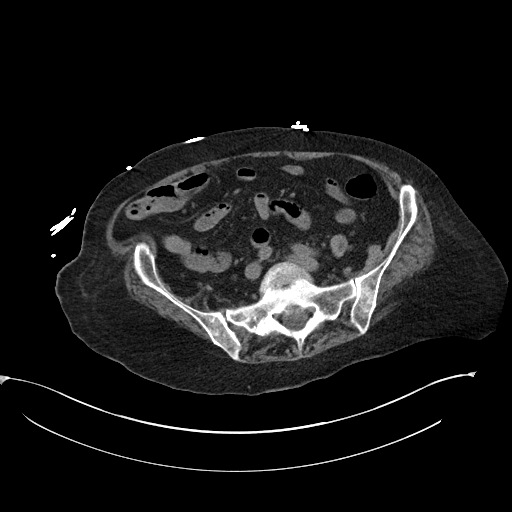
[im 54/96  soft-tissue]
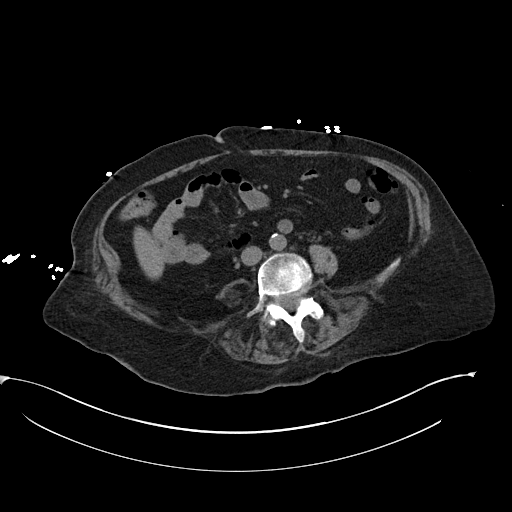
[im 61/96  soft-tissue]
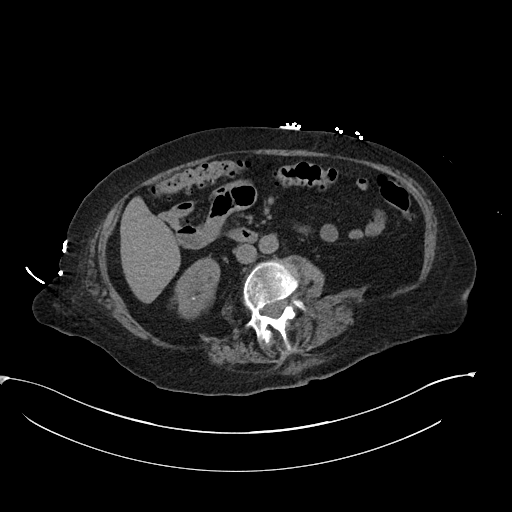
[im 69/96  soft-tissue]
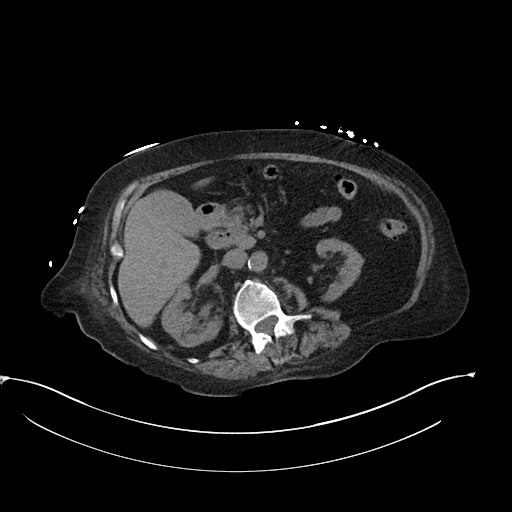
[im 69/96  bone]
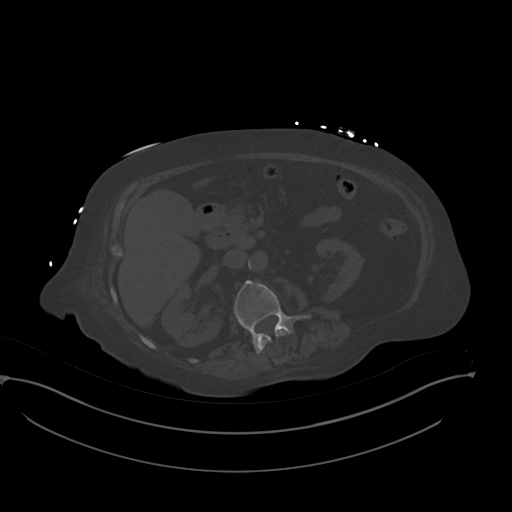
[im 77/96  soft-tissue]
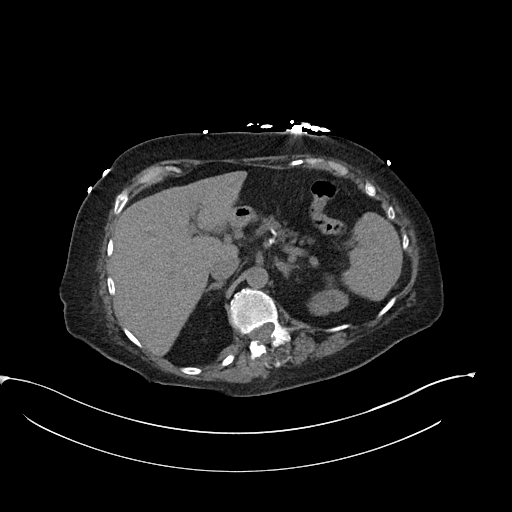
[im 84/96  soft-tissue]
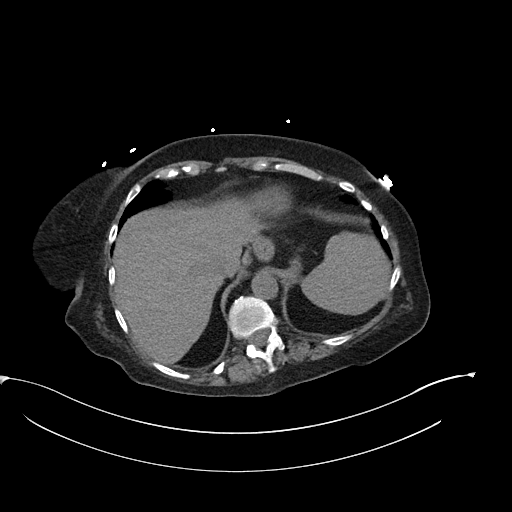
[im 92/96  soft-tissue]
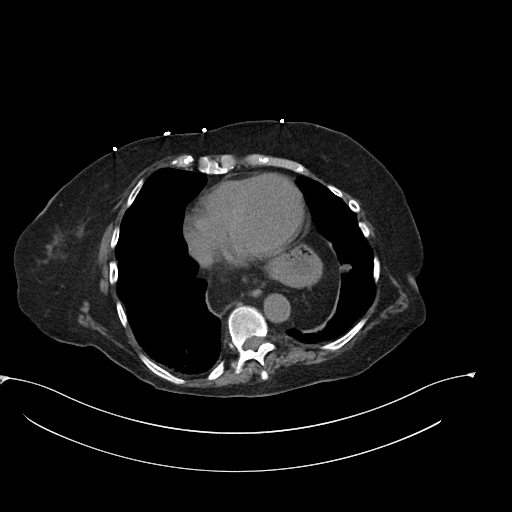

[Series 6: renal stone 3.0 cor · coronal · 0.85mm/px · 3 of 94 slices shown]
[im 32/94  soft-tissue]
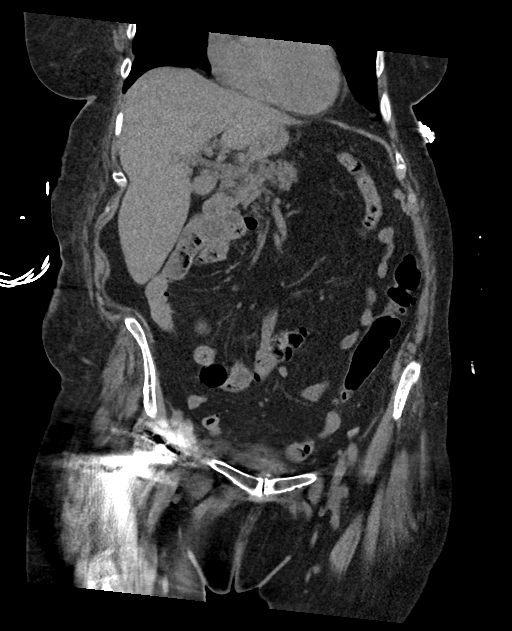
[im 42/94  soft-tissue]
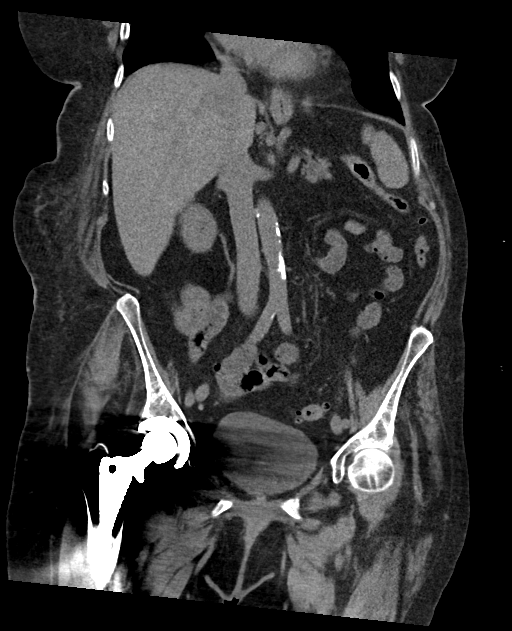
[im 52/94  soft-tissue]
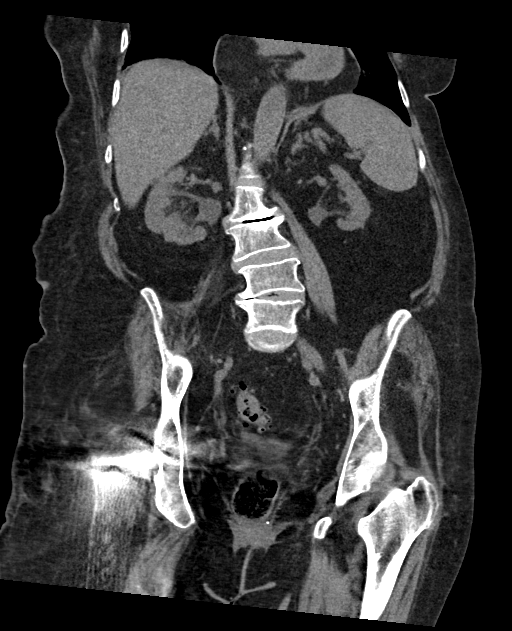

[15 of 46 positions shown; findings below may reference images not displayed]

FINDINGS: Lower chest: Interval mild right lower lobe atelectasis and minimal
left lower lobe atelectasis. There is also interval 1.4 cm oval
density in the left lower lobe, adjacent to a large hiatal hernia
containing stomach and fat.

Hepatobiliary: The liver remains somewhat small. 7 mm gallstone in
the gallbladder without gallbladder wall thickening or
pericholecystic fluid.

Pancreas: Moderate diffuse pancreatic atrophy.

Spleen: Normal in size without focal abnormality.

Adrenals/Urinary Tract: Tiny mid and lower right renal calculi. The
previously demonstrated 8 mm right UPJ calculus is no longer
demonstrated. The previously demonstrated right hydronephrosis has
resolved. There are mildly prominent bilateral extrarenal pelves,
right greater than left. Moderate atrophy of the left kidney is
again noted. No bladder or ureteral calculi are seen. Normal
appearing adrenal glands.

Stomach/Bowel: Large hiatal hernia containing stomach and fat. Large
number of colonic diverticula without evidence of diverticulitis.
Normal appearing appendix. Unremarkable small bowel.

Vascular/Lymphatic: Atheromatous arterial calcifications without
aneurysm. No enlarged lymph nodes.

Reproductive: Status post hysterectomy. No adnexal masses.

Other: No abdominal wall hernia or abnormality. No abdominopelvic
ascites.

Musculoskeletal: Right hip prosthesis with associated streak
artifacts. Lumbar spine degenerative changes and scoliosis. These
include facet degenerative changes with associated grade 1
anterolisthesis at the L4-5 and L5-S1 levels. No pars defects or
fractures are seen.
IMPRESSION: 1. Interval 1.4 cm oval density in the left lower lobe, adjacent to
a large hiatal hernia. This has an appearance compatible with a
rounded area of atelectasis or pneumonia.
2. Mild right lower lobe and minimal left lower lobe linear
atelectasis.
3. Cholelithiasis without evidence of cholecystitis.
4. The previously demonstrated right UPJ calculus and hydronephrosis
are no longer present.
5. Tiny, nonobstructing right renal calculi.
6. Large hiatal hernia containing stomach and fat.
7. Extensive colonic diverticulosis.
8. Stable moderate left renal atrophy.
